# Patient Record
Sex: Male | Born: 1945 | Race: White | Hispanic: No | Marital: Married | Smoking: Former smoker
Health system: Southern US, Community
[De-identification: ages and names within clinical notes are randomized; demographics above are authoritative.]

## PROBLEM LIST (undated history)

## (undated) DIAGNOSIS — R51 Headache: Secondary | ICD-10-CM

## (undated) DIAGNOSIS — H918X9 Other specified hearing loss, unspecified ear: Secondary | ICD-10-CM

## (undated) DIAGNOSIS — R0989 Other specified symptoms and signs involving the circulatory and respiratory systems: Secondary | ICD-10-CM

## (undated) DIAGNOSIS — K219 Gastro-esophageal reflux disease without esophagitis: Secondary | ICD-10-CM

## (undated) DIAGNOSIS — T7840XA Allergy, unspecified, initial encounter: Secondary | ICD-10-CM

## (undated) DIAGNOSIS — E785 Hyperlipidemia, unspecified: Secondary | ICD-10-CM

## (undated) DIAGNOSIS — N529 Male erectile dysfunction, unspecified: Secondary | ICD-10-CM

## (undated) DIAGNOSIS — K429 Umbilical hernia without obstruction or gangrene: Secondary | ICD-10-CM

## (undated) DIAGNOSIS — M79609 Pain in unspecified limb: Secondary | ICD-10-CM

## (undated) DIAGNOSIS — R5383 Other fatigue: Secondary | ICD-10-CM

## (undated) DIAGNOSIS — I340 Nonrheumatic mitral (valve) insufficiency: Secondary | ICD-10-CM

## (undated) DIAGNOSIS — R5381 Other malaise: Secondary | ICD-10-CM

## (undated) DIAGNOSIS — M109 Gout, unspecified: Secondary | ICD-10-CM

## (undated) DIAGNOSIS — I10 Essential (primary) hypertension: Secondary | ICD-10-CM

## (undated) DIAGNOSIS — G5600 Carpal tunnel syndrome, unspecified upper limb: Secondary | ICD-10-CM

## (undated) DIAGNOSIS — H269 Unspecified cataract: Secondary | ICD-10-CM

## (undated) DIAGNOSIS — R0609 Other forms of dyspnea: Secondary | ICD-10-CM

## (undated) DIAGNOSIS — F988 Other specified behavioral and emotional disorders with onset usually occurring in childhood and adolescence: Secondary | ICD-10-CM

## (undated) DIAGNOSIS — M1990 Unspecified osteoarthritis, unspecified site: Secondary | ICD-10-CM

## (undated) DIAGNOSIS — R6882 Decreased libido: Secondary | ICD-10-CM

## (undated) HISTORY — DX: Essential (primary) hypertension: I10

## (undated) HISTORY — DX: Gastro-esophageal reflux disease without esophagitis: K21.9

## (undated) HISTORY — DX: Carpal tunnel syndrome, unspecified upper limb: G56.00

## (undated) HISTORY — DX: Other malaise: R53.81

## (undated) HISTORY — DX: Hyperlipidemia, unspecified: E78.5

## (undated) HISTORY — DX: Other specified behavioral and emotional disorders with onset usually occurring in childhood and adolescence: F98.8

## (undated) HISTORY — DX: Unspecified cataract: H26.9

## (undated) HISTORY — DX: Gout, unspecified: M10.9

## (undated) HISTORY — DX: Other forms of dyspnea: R06.09

## (undated) HISTORY — DX: Decreased libido: R68.82

## (undated) HISTORY — DX: Other specified symptoms and signs involving the circulatory and respiratory systems: R09.89

## (undated) HISTORY — DX: Pain in unspecified limb: M79.609

## (undated) HISTORY — PX: UPPER GASTROINTESTINAL ENDOSCOPY: SHX188

## (undated) HISTORY — DX: Other specified hearing loss, unspecified ear: H91.8X9

## (undated) HISTORY — DX: Male erectile dysfunction, unspecified: N52.9

## (undated) HISTORY — DX: Nonrheumatic mitral (valve) insufficiency: I34.0

## (undated) HISTORY — PX: OTHER SURGICAL HISTORY: SHX169

## (undated) HISTORY — PX: CATARACT EXTRACTION: SUR2

## (undated) HISTORY — DX: Headache: R51

## (undated) HISTORY — DX: Allergy, unspecified, initial encounter: T78.40XA

## (undated) HISTORY — DX: Other fatigue: R53.83

## (undated) HISTORY — PX: UMBILICAL HERNIA REPAIR: SHX196

## (undated) HISTORY — DX: Umbilical hernia without obstruction or gangrene: K42.9

## (undated) HISTORY — DX: Unspecified osteoarthritis, unspecified site: M19.90

---
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
## 1968-11-05 HISTORY — PX: ELBOW SURGERY: SHX618

## 2005-02-13 ENCOUNTER — Ambulatory Visit: Payer: Self-pay | Admitting: Internal Medicine

## 2005-03-26 ENCOUNTER — Ambulatory Visit: Payer: Self-pay | Admitting: Gastroenterology

## 2005-04-06 ENCOUNTER — Ambulatory Visit: Payer: Self-pay | Admitting: Internal Medicine

## 2005-04-24 HISTORY — PX: COLONOSCOPY: SHX174

## 2005-08-23 ENCOUNTER — Ambulatory Visit: Payer: Self-pay | Admitting: Internal Medicine

## 2006-05-23 ENCOUNTER — Ambulatory Visit: Payer: Self-pay | Admitting: Internal Medicine

## 2006-06-20 ENCOUNTER — Ambulatory Visit: Payer: Self-pay | Admitting: Internal Medicine

## 2006-08-29 ENCOUNTER — Ambulatory Visit: Payer: Self-pay | Admitting: Internal Medicine

## 2007-02-28 ENCOUNTER — Ambulatory Visit: Payer: Self-pay | Admitting: Internal Medicine

## 2007-05-19 ENCOUNTER — Ambulatory Visit: Payer: Self-pay | Admitting: Internal Medicine

## 2007-05-19 LAB — CONVERTED CEMR LAB
Basophils Absolute: 0.1 10*3/uL (ref 0.0–0.1)
CO2: 30 meq/L (ref 19–32)
Cholesterol: 125 mg/dL (ref 0–200)
Eosinophils Absolute: 0.4 10*3/uL (ref 0.0–0.6)
Eosinophils Relative: 5.9 % — ABNORMAL HIGH (ref 0.0–5.0)
GFR calc non Af Amer: 66 mL/min
Glucose, Bld: 104 mg/dL — ABNORMAL HIGH (ref 70–99)
HDL: 33.6 mg/dL — ABNORMAL LOW (ref >39.0)
Hemoglobin, Urine: NEGATIVE
Hemoglobin: 14.8 g/dL (ref 13.0–17.0)
Ketones, ur: NEGATIVE mg/dL
Lymphocytes Relative: 28.3 % (ref 12.0–46.0)
Neutrophils Relative %: 57.3 % (ref 43.0–77.0)
PSA: 0.78 ng/mL (ref 0.10–4.00)
Platelets: 191 10*3/uL (ref 150–400)
Potassium: 4.3 meq/L (ref 3.5–5.1)
RBC: 4.82 M/uL (ref 4.22–5.81)
RDW: 12.2 % (ref 11.5–14.6)
Specific Gravity, Urine: >=1.03 (ref 1.000–1.03)
TSH: 0.97 microintl units/mL (ref 0.35–5.50)
Urine Glucose: NEGATIVE mg/dL
VLDL: 11 mg/dL (ref 0–40)
WBC: 6 10*3/uL (ref 4.5–10.5)
pH: 5.5 (ref 5.0–8.0)

## 2007-05-20 ENCOUNTER — Ambulatory Visit: Payer: Self-pay | Admitting: Internal Medicine

## 2007-05-20 DIAGNOSIS — I10 Essential (primary) hypertension: Secondary | ICD-10-CM

## 2007-05-20 DIAGNOSIS — K219 Gastro-esophageal reflux disease without esophagitis: Secondary | ICD-10-CM

## 2007-05-20 HISTORY — DX: Gastro-esophageal reflux disease without esophagitis: K21.9

## 2007-05-20 HISTORY — DX: Essential (primary) hypertension: I10

## 2008-07-09 ENCOUNTER — Ambulatory Visit: Payer: Self-pay | Admitting: Internal Medicine

## 2008-07-09 DIAGNOSIS — F988 Other specified behavioral and emotional disorders with onset usually occurring in childhood and adolescence: Secondary | ICD-10-CM | POA: Insufficient documentation

## 2008-07-09 DIAGNOSIS — E785 Hyperlipidemia, unspecified: Secondary | ICD-10-CM

## 2008-07-09 HISTORY — DX: Hyperlipidemia, unspecified: E78.5

## 2008-07-09 HISTORY — DX: Other specified behavioral and emotional disorders with onset usually occurring in childhood and adolescence: F98.8

## 2008-07-13 ENCOUNTER — Ambulatory Visit: Payer: Self-pay | Admitting: Internal Medicine

## 2008-07-14 LAB — CONVERTED CEMR LAB
ALT: 29 units/L (ref 0–53)
BUN: 13 mg/dL (ref 6–23)
Basophils Relative: 0.7 % (ref 0.0–3.0)
Bilirubin Urine: NEGATIVE
CO2: 31 meq/L (ref 19–32)
GFR calc Af Amer: 73 mL/min
Glucose, Bld: 120 mg/dL — ABNORMAL HIGH (ref 70–99)
Hemoglobin, Urine: NEGATIVE
Leukocytes, UA: NEGATIVE
Lymphocytes Relative: 22.5 % (ref 12.0–46.0)
Monocytes Relative: 6.2 % (ref 3.0–12.0)
Neutro Abs: 4 10*3/uL (ref 1.4–7.7)
Neutrophils Relative %: 66.2 % (ref 43.0–77.0)
Nitrite: NEGATIVE
Sodium: 142 meq/L (ref 135–145)
Total Protein, Urine: NEGATIVE mg/dL
Total Protein: 6.9 g/dL (ref 6.0–8.3)
Triglycerides: 43 mg/dL (ref 0–149)
VLDL: 9 mg/dL (ref 0–40)
WBC: 6 10*3/uL (ref 4.5–10.5)

## 2008-11-08 ENCOUNTER — Telehealth: Payer: Self-pay | Admitting: Internal Medicine

## 2009-04-21 ENCOUNTER — Telehealth (INDEPENDENT_AMBULATORY_CARE_PROVIDER_SITE_OTHER): Payer: Self-pay | Admitting: *Deleted

## 2009-05-06 ENCOUNTER — Telehealth: Payer: Self-pay | Admitting: Internal Medicine

## 2009-05-16 ENCOUNTER — Telehealth: Payer: Self-pay | Admitting: Internal Medicine

## 2009-06-15 ENCOUNTER — Telehealth (INDEPENDENT_AMBULATORY_CARE_PROVIDER_SITE_OTHER): Payer: Self-pay | Admitting: *Deleted

## 2009-06-17 ENCOUNTER — Ambulatory Visit: Payer: Self-pay | Admitting: Internal Medicine

## 2009-06-17 LAB — CONVERTED CEMR LAB
Alkaline Phosphatase: 40 units/L (ref 39–117)
Basophils Absolute: 0 10*3/uL (ref 0.0–0.1)
Bilirubin, Direct: 0.2 mg/dL (ref 0.0–0.3)
CO2: 29 meq/L (ref 19–32)
Calcium: 9.2 mg/dL (ref 8.4–10.5)
Chloride: 107 meq/L (ref 96–112)
Cholesterol: 117 mg/dL (ref 0–200)
Eosinophils Absolute: 0.3 10*3/uL (ref 0.0–0.7)
Glucose, Bld: 101 mg/dL — ABNORMAL HIGH (ref 70–99)
HCT: 41.5 % (ref 39.0–52.0)
HDL: 33.4 mg/dL — ABNORMAL LOW (ref >39.00)
Hemoglobin: 14.5 g/dL (ref 13.0–17.0)
Ketones, ur: NEGATIVE mg/dL
MCV: 89 fL (ref 78.0–100.0)
Monocytes Absolute: 0.5 10*3/uL (ref 0.1–1.0)
Monocytes Relative: 7.6 % (ref 3.0–12.0)
Neutro Abs: 4.2 10*3/uL (ref 1.4–7.7)
Potassium: 4.2 meq/L (ref 3.5–5.1)
RBC: 4.67 M/uL (ref 4.22–5.81)
RDW: 11.8 % (ref 11.5–14.6)
Specific Gravity, Urine: 1.02 (ref 1.000–1.030)
Total Bilirubin: 1.2 mg/dL (ref 0.3–1.2)
Total CHOL/HDL Ratio: 4
Total Protein, Urine: NEGATIVE mg/dL
Urine Glucose: NEGATIVE mg/dL
WBC: 6.7 10*3/uL (ref 4.5–10.5)
pH: 7 (ref 5.0–8.0)

## 2009-06-24 ENCOUNTER — Ambulatory Visit: Payer: Self-pay | Admitting: Internal Medicine

## 2009-08-25 ENCOUNTER — Telehealth: Payer: Self-pay | Admitting: Endocrinology

## 2009-08-25 ENCOUNTER — Ambulatory Visit: Payer: Self-pay | Admitting: Endocrinology

## 2009-08-25 DIAGNOSIS — R51 Headache: Secondary | ICD-10-CM

## 2009-08-25 DIAGNOSIS — R519 Headache, unspecified: Secondary | ICD-10-CM | POA: Insufficient documentation

## 2009-08-25 HISTORY — DX: Headache: R51

## 2009-08-26 LAB — CONVERTED CEMR LAB: Sed Rate: 9 mm/hr (ref 0–22)

## 2009-08-29 ENCOUNTER — Telehealth: Payer: Self-pay | Admitting: Endocrinology

## 2009-12-15 ENCOUNTER — Ambulatory Visit: Payer: Self-pay | Admitting: Internal Medicine

## 2009-12-15 DIAGNOSIS — G5600 Carpal tunnel syndrome, unspecified upper limb: Secondary | ICD-10-CM

## 2009-12-15 DIAGNOSIS — R0989 Other specified symptoms and signs involving the circulatory and respiratory systems: Secondary | ICD-10-CM

## 2009-12-15 DIAGNOSIS — M79609 Pain in unspecified limb: Secondary | ICD-10-CM

## 2009-12-15 DIAGNOSIS — R0609 Other forms of dyspnea: Secondary | ICD-10-CM | POA: Insufficient documentation

## 2009-12-15 DIAGNOSIS — K429 Umbilical hernia without obstruction or gangrene: Secondary | ICD-10-CM

## 2009-12-15 DIAGNOSIS — G5603 Carpal tunnel syndrome, bilateral upper limbs: Secondary | ICD-10-CM | POA: Insufficient documentation

## 2009-12-15 HISTORY — DX: Umbilical hernia without obstruction or gangrene: K42.9

## 2009-12-15 HISTORY — DX: Other forms of dyspnea: R06.09

## 2009-12-15 HISTORY — DX: Carpal tunnel syndrome, unspecified upper limb: G56.00

## 2009-12-15 HISTORY — DX: Pain in unspecified limb: M79.609

## 2009-12-15 HISTORY — DX: Other specified symptoms and signs involving the circulatory and respiratory systems: R09.89

## 2009-12-27 ENCOUNTER — Telehealth (INDEPENDENT_AMBULATORY_CARE_PROVIDER_SITE_OTHER): Payer: Self-pay | Admitting: *Deleted

## 2009-12-27 ENCOUNTER — Ambulatory Visit: Payer: Self-pay | Admitting: Internal Medicine

## 2010-01-03 ENCOUNTER — Encounter: Payer: Self-pay | Admitting: Internal Medicine

## 2010-01-05 ENCOUNTER — Encounter: Payer: Self-pay | Admitting: Internal Medicine

## 2010-01-13 ENCOUNTER — Telehealth: Payer: Self-pay | Admitting: Internal Medicine

## 2010-01-16 ENCOUNTER — Ambulatory Visit (HOSPITAL_COMMUNITY): Admission: RE | Admit: 2010-01-16 | Discharge: 2010-01-16 | Payer: Self-pay | Admitting: Internal Medicine

## 2010-01-16 ENCOUNTER — Ambulatory Visit: Payer: Self-pay

## 2010-01-16 ENCOUNTER — Ambulatory Visit: Payer: Self-pay | Admitting: Cardiology

## 2010-01-16 ENCOUNTER — Encounter: Payer: Self-pay | Admitting: Internal Medicine

## 2010-01-23 ENCOUNTER — Encounter: Payer: Self-pay | Admitting: Internal Medicine

## 2010-03-27 ENCOUNTER — Ambulatory Visit: Payer: Self-pay | Admitting: Internal Medicine

## 2010-03-27 DIAGNOSIS — R5381 Other malaise: Secondary | ICD-10-CM

## 2010-03-27 DIAGNOSIS — R6882 Decreased libido: Secondary | ICD-10-CM | POA: Insufficient documentation

## 2010-03-27 DIAGNOSIS — M109 Gout, unspecified: Secondary | ICD-10-CM

## 2010-03-27 DIAGNOSIS — R5383 Other fatigue: Secondary | ICD-10-CM

## 2010-03-27 HISTORY — DX: Other malaise: R53.81

## 2010-03-27 HISTORY — DX: Decreased libido: R68.82

## 2010-03-27 HISTORY — DX: Gout, unspecified: M10.9

## 2010-04-19 ENCOUNTER — Telehealth: Payer: Self-pay | Admitting: Internal Medicine

## 2010-06-28 ENCOUNTER — Ambulatory Visit: Payer: Self-pay | Admitting: Internal Medicine

## 2010-06-28 LAB — CONVERTED CEMR LAB
ALT: 30 units/L (ref 0–53)
AST: 30 units/L (ref 0–37)
Albumin: 3.9 g/dL (ref 3.5–5.2)
Alkaline Phosphatase: 43 units/L (ref 39–117)
BUN: 17 mg/dL (ref 6–23)
Basophils Absolute: 0 10*3/uL (ref 0.0–0.1)
Basophils Relative: 0.3 % (ref 0.0–3.0)
Bilirubin Urine: NEGATIVE
Bilirubin, Direct: 0.1 mg/dL (ref 0.0–0.3)
CO2: 26 meq/L (ref 19–32)
Calcium: 9.1 mg/dL (ref 8.4–10.5)
Chloride: 108 meq/L (ref 96–112)
Cholesterol: 121 mg/dL (ref 0–200)
Creatinine, Ser: 1.2 mg/dL (ref 0.4–1.5)
Eosinophils Absolute: 0.3 10*3/uL (ref 0.0–0.7)
Eosinophils Relative: 5.2 % — ABNORMAL HIGH (ref 0.0–5.0)
GFR calc non Af Amer: 67.2 mL/min (ref >60)
Glucose, Bld: 102 mg/dL — ABNORMAL HIGH (ref 70–99)
HCT: 38.9 % — ABNORMAL LOW (ref 39.0–52.0)
HDL: 41.3 mg/dL (ref >39.00)
Hemoglobin, Urine: NEGATIVE
Hemoglobin: 13.3 g/dL (ref 13.0–17.0)
Ketones, ur: NEGATIVE mg/dL
LDL Cholesterol: 72 mg/dL (ref 0–99)
Leukocytes, UA: NEGATIVE
Lymphocytes Relative: 26 % (ref 12.0–46.0)
Lymphs Abs: 1.4 10*3/uL (ref 0.7–4.0)
MCHC: 34.3 g/dL (ref 30.0–36.0)
MCV: 89.7 fL (ref 78.0–100.0)
Monocytes Absolute: 0.4 10*3/uL (ref 0.1–1.0)
Monocytes Relative: 6.8 % (ref 3.0–12.0)
Neutro Abs: 3.3 10*3/uL (ref 1.4–7.7)
Neutrophils Relative %: 61.7 % (ref 43.0–77.0)
Nitrite: NEGATIVE
PSA: 1.02 ng/mL (ref 0.10–4.00)
Platelets: 159 10*3/uL (ref 150.0–400.0)
Potassium: 4.3 meq/L (ref 3.5–5.1)
RBC: 4.34 M/uL (ref 4.22–5.81)
RDW: 12.4 % (ref 11.5–14.6)
Sodium: 141 meq/L (ref 135–145)
Specific Gravity, Urine: 1.025 (ref 1.000–1.030)
TSH: 0.73 microintl units/mL (ref 0.35–5.50)
Total Bilirubin: 0.6 mg/dL (ref 0.3–1.2)
Total CHOL/HDL Ratio: 3
Total Protein, Urine: NEGATIVE mg/dL
Total Protein: 6.5 g/dL (ref 6.0–8.3)
Triglycerides: 40 mg/dL (ref 0.0–149.0)
Uric Acid, Serum: 7.1 mg/dL (ref 4.0–7.8)
Urine Glucose: NEGATIVE mg/dL
Urobilinogen, UA: 0.2 (ref 0.0–1.0)
VLDL: 8 mg/dL (ref 0.0–40.0)
WBC: 5.3 10*3/uL (ref 4.5–10.5)
pH: 6 (ref 5.0–8.0)

## 2010-06-29 LAB — CONVERTED CEMR LAB
Sex Hormone Binding: 28 nmol/L (ref 13–71)
Testosterone: 331.02 ng/dL — ABNORMAL LOW (ref 350–890)

## 2010-06-30 ENCOUNTER — Ambulatory Visit: Payer: Self-pay | Admitting: Internal Medicine

## 2010-06-30 DIAGNOSIS — H918X9 Other specified hearing loss, unspecified ear: Secondary | ICD-10-CM

## 2010-06-30 HISTORY — DX: Other specified hearing loss, unspecified ear: H91.8X9

## 2010-07-28 ENCOUNTER — Telehealth: Payer: Self-pay | Admitting: Internal Medicine

## 2010-12-05 NOTE — Miscellaneous (Signed)
Summary: Orders Update pft charges  Clinical Lists Changes  Orders: Added new Service order of Carbon Monoxide diffusing w/capacity (94720) - Signed Added new Service order of Lung Volumes (94240) - Signed Added new Service order of Spirometry (Pre & Post) (94060) - Signed 

## 2010-12-05 NOTE — Assessment & Plan Note (Signed)
Summary: CPX/NWS  #   Vital Signs:  Patient profile:   65 year old male Height:      74 inches Weight:      215 pounds BMI:     27.70 O2 Sat:      97 % on Room air Temp:     98 degrees F oral Pulse rate:   61 / minute BP sitting:   110 / 80  (left arm) Cuff size:   large  Vitals Entered By: Zella Ball Ewing CMA (AAMA) (June 30, 2010 8:45 AM)  O2 Flow:  Room air  CC: Adult Physical/RE   CC:  Adult Physical/RE.  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 Take 1 Tablet By Mouth Once A Day 2)  Nexium 40 Mg Cpdr (Esomeprazole Magnesium) .... Take 1 Tablet By Mouth Once A Day 3)  Benazepril Hcl 5 Mg Tabs (Benazepril Hcl) .... Take 1 By Mouth Once Daily 4)  Adult Aspirin Ec Low Strength 81 Mg Tbec (Aspirin) .Marland Kitchen.. 1 By Mouth Once Daily 5)  Dulera 100-5 Mcg/act Aero (Mometasone Furo-Formoterol Fum) .... 2 Spray/side Once Daily 6)  Proair Hfa 108 (90 Base) Mcg/act Aers (Albuterol Sulfate) .... 2 Pufss Four Times Per Day As Needed For Shortness of Breath 7)   Allopurinol 100 Mg Tabs (Allopurinol) .Marland Kitchen.. 1po Once Daily  Current Medications (verified): 1)  Crestor 20 Mg Tabs (Rosuvastatin Calcium) .... Take 1 Tablet By Mouth Once A Day 2)  Nexium 40 Mg Cpdr (Esomeprazole Magnesium) .... Take 1 Tablet By Mouth Once A Day 3)  Benazepril Hcl 5 Mg Tabs (Benazepril Hcl) .... Take 1 By Mouth Once Daily 4)  Adult Aspirin Ec Low Strength 81 Mg Tbec (Aspirin) .Marland Kitchen.. 1 By Mouth Once Daily 5)  Dulera 100-5 Mcg/act Aero (Mometasone Furo-Formoterol Fum) .... 2 Spray/side Once Daily 6)  Proair Hfa 108 (90 Base) Mcg/act Aers (Albuterol Sulfate) .... 2 Pufss Four Times Per Day As Needed For Shortness of Breath 7)  Allopurinol 100 Mg Tabs (Allopurinol) .Marland Kitchen.. 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 PREVENTIVE HEALTH CARE (ICD-V70.0) ADD (ICD-314.00) HYPERLIPIDEMIA (ICD-272.4) HYPERTENSION (ICD-401.9) GERD (ICD-530.81) Gout  Past Surgical History: Last updated: 07/09/2008 s/p basal cell cancer s/p right elbow surgury /p ankle surgury  Family History: Last updated: 07/09/2008 brother with lung cancer HTN - father mother with HTN, ETOH daughter with PFO, and embolic stroke at 63 yo  Social History: Last updated: 07/09/2008 [employer] - work  Former Smoker Alcohol use-yes Married 3 daughters  Risk Factors: Smoking Status: quit (07/09/2008)  Review of Systems  The patient denies anorexia, fever, weight loss, weight gain, vision loss, decreased hearing, hoarseness, chest pain, syncope, dyspnea on exertion, peripheral edema, prolonged cough, headaches, hemoptysis, abdominal pain, melena, hematochezia, severe indigestion/heartburn, hematuria, muscle weakness, suspicious skin lesions, transient blindness, difficulty walking, depression, unusual weight change, abnormal bleeding, enlarged lymph nodes, and angioedema.         all  otherwise negative per pt -  except for midl decreased hearing with wax  Physical Exam  General:  alert and well-hydrated.   Eyes:  vision grossly intact, pupils equal, and pupils round.   Ears:  R ear normal and L ear normal.   Nose:  no external deformity and no nasal discharge.   Mouth:  no gingival abnormalities and pharynx pink and moist.   Neck:  supple and no masses.   Lungs:  normal respiratory effort and normal breath sounds.   Heart:  normal rate and regular rhythm.   Abdomen:  soft, non-tender, and normal bowel sounds.   Msk:  no joint tenderness and no joint swelling.   Extremities:  no edema, no erythema  Neurologic:  cranial nerves II-XII intact and strength normal in all extremities.   Skin:  color normal and no rashes.   Psych:  not anxious appearing and not depressed appearing.     Impression & Recommendations:  Problem # 1:  Preventive Health Care (ICD-V70.0) Overall doing well, age appropriate education and counseling updated and referral for appropriate preventive services done unless declined, immunizations up to date or declined, diet counseling done if overweight, urged to quit smoking if smokes , most recent labs reviewed and current ordered if appropriate, ecg reviewed or declined (interpretation per ECG scanned in the EMR if done); information regarding Medicare Prevention requirements given if appropriate; speciality referrals updated as appropriate   Problem # 2:  OTHER SPECIFIED FORMS OF HEARING LOSS (ICD-389.8) for irrigation bilat with hearing improved  Complete Medication List: 1)  Crestor 20 Mg Tabs (Rosuvastatin calcium) .... Take 1 tablet by mouth once a day 2)  Nexium 40 Mg Cpdr (Esomeprazole magnesium) .... Take 1 tablet by mouth once a day 3)  Benazepril Hcl 5 Mg Tabs (Benazepril hcl) .... Take 1 by mouth once daily 4)  Adult Aspirin Ec Low Strength 81 Mg Tbec (Aspirin) .Marland Kitchen.. 1 by mouth once daily 5)  Dulera 100-5 Mcg/act Aero (Mometasone  furo-formoterol fum) .... 2 spray/side once daily 6)  Proair Hfa 108 (90 Base) Mcg/act Aers (Albuterol sulfate) .... 2 pufss four times per day as needed for shortness of breath 7)  Allopurinol 100 Mg Tabs (Allopurinol) .Marland Kitchen.. 1po once daily  Other Orders: Admin 1st Vaccine (16109) Flu Vaccine 40yrs + [phone])  Patient Instructions: 1)  you had the flu shot today 2)  You ears were irrigated today of wax 3)  You are given the refills 4)  Please try to be more physically active with regualr excercise such as walking 5)  Please schedule a follow-up appointment in 1 year or sooner if needed Prescriptions: ALLOPURINOL 100 MG TABS (ALLOPURINOL) 1po once daily  #90 x 3   Entered and Authorized by:   Corwin Levins MD   Signed by:   Corwin Levins MD on 06/30/2010   Method used:   Print then Give to Patient   RxID:   [id number] PROAIR HFA 108 (90 BASE) MCG/ACT AERS (ALBUTEROL SULFATE) 2 pufss  four times per day as needed for shortness of breath  #1 x 11   Entered and Authorized by:   Corwin Levins MD   Signed by:   Corwin Levins MD on 06/30/2010   Method used:   Print then Give to Patient   RxID:   [id number] DULERA 100-5 MCG/ACT AERO (MOMETASONE FURO-FORMOTEROL FUM) 2 spray/side once daily  #1 x 11   Entered and Authorized by:   Corwin Levins MD   Signed by:   Corwin Levins MD on 06/30/2010   Method used:   Print then Give to Patient   RxID:   [id number] BENAZEPRIL HCL 5 MG TABS (BENAZEPRIL HCL) TAKE 1 by mouth once daily  #90 x 3   Entered and Authorized by:   Corwin Levins MD   Signed by:   Corwin Levins MD on 06/30/2010   Method used:   Print then Give to Patient   RxID:   [id number] NEXIUM 40 MG CPDR (ESOMEPRAZOLE MAGNESIUM) Take 1 tablet by mouth once a day  #90 x 3   Entered and Authorized by:   Corwin Levins MD   Signed by:   Corwin Levins MD on 06/30/2010   Method used:   Print then Give to Patient   RxID:   [id number] CRESTOR 20 MG TABS (ROSUVASTATIN CALCIUM)  Take 1 tablet by mouth once a day  #90 x 3   Entered and Authorized by:   Corwin Levins MD   Signed by:   Corwin Levins MD on 06/30/2010   Method used:   Print then Give to Patient   RxID:   [id number]      Flu Vaccine Consent Questions     Do you have a history of severe allergic reactions to this vaccine? no    Any prior history of allergic reactions to egg and/or gelatin? no    Do you have a sensitivity to the preservative Thimersol? no    Do you have a past history of Guillan-Barre Syndrome? no    Do you currently have an acute febrile illness? no    Have you ever had a severe reaction to latex? no    Vaccine information given and explained to patient? yes    Are you currently pregnant? no    Lot Number:[id number]   Exp Date:05/05/2011   Site Given  Left Deltoid IMlu

## 2010-12-05 NOTE — Assessment & Plan Note (Signed)
Summary: HERNIA /NWS  #   Vital Signs:  Patient profile:   65 year old male Height:      74 inches (187.96 cm) Weight:      220.50 pounds (100.23 kg) BMI:     28.41 O2 Sat:      95 % on Room air Temp:     97.3 degrees F (36.28 degrees C) oral Pulse rate:   62 / minute BP sitting:   122 / 68  Vitals Entered ByZella Ball Ewing (December 15, 2009 3:24 PM)  O2 Flow:  Room air CC: follow up visit/hernia/re   CC:  follow up visit/hernia/re.  History of Present Illness: here to c/o incresaed intermittent pain to the umbilical area and sliightly to the right mid abd area increased recently possibly assoc with increased wt and abd fat but clearly worse with even mild daily activities such as bending to tie the shoes;  has known umbilic hernia essentially asympt to date, but now somewhat tender but he can "still push it back in".  No fever, other abd pain, n/v, change in bowel or bladder symtpoms o/w; and no BRBPR.    also mentions the right hand first MCP area seems to have a hard bony area to it enlarging in the past few months with incresed mild pain but no soft tissue sweling, injury, fever, other trauma.  Has hx of bone spur to elbows and wonders if this could be similar.  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  No other hand, wrist or arm symtpoms, and no numbness or loss or grip strength.    Also menitons he has had stable but persistnet significant DOE for the past approx one year;  Has some deconditiong due to lack of regular excercise, and gives a nod to his age and has tried to ignore his symtpoms to which he is not exactly sure when started, but brings it up today as he just cant seem to function as well as last year, in that he has signfiicatn DOE with activities out of proportion , such as climbing just one flight or stairs, hiking with the family (has to stop frequently on hikes he used to do more easily) and even yardwork.  Pt denies  wheezing, orthopnea, pnd,  worsening LE edema, palps, dizziness or syncope .  Quit smoking years ago;  remembers his father as smoker who had to use an inhaler for symptoms.  No falls or other injury, no fever or cough.  No recent new pets or travel.   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 Take 1 Tablet By Mouth Once A Day 2)  Nexium 40 Mg Cpdr (Esomeprazole Magnesium) .... Take 1 Tablet By Mouth Once A Day 3)  Benazepril Hcl 5 Mg Tabs (Benazepril Hcl) .... Take 1 By Mouth Once Daily 4)  Adult Aspirin Ec Low Strength 81 Mg Tbec (Aspirin) .Marland Kitchen.. 1 By Mouth Once Daily  Current Medications (verified): 1)  Crestor 20 Mg Tabs (Rosuvastatin Calcium) .... Take 1 Tablet By Mouth Once A Day 2)  Nexium 40 Mg Cpdr (Esomeprazole Magnesium) .... Take 1 Tablet By Mouth Once A Day 3)  Benazepril Hcl 5 Mg Tabs (Benazepril Hcl) .... Take 1 By Mouth Once Daily 4)  Adult Aspirin Ec Low Strength 81 Mg Tbec (Aspirin) .Marland Kitchen.. 1 By Mouth Once Daily 5)  Dulera 100-5 Mcg/act Aero (Mometasone Furo-Formoterol Fum) .... 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   Head:  normocephalic and atraumatic.   Eyes:  vision grossly intact, pupils equal, and pupils round.   Ears:  R ear normal and L ear normal.   Nose:  no external deformity and no nasal discharge.   Mouth:  no gingival abnormalities and pharynx pink and moist.   Neck:  supple and no masses.   Lungs:  normal respiratory effort and normal breath sounds.   Heart:  normal rate and regular rhythm.   Abdomen:  soft, non-tender, and normal bowel sounds.  , has unbilic hernia easily reducible and minimal tender Msk:  no joint tenderness and no joint swelling. - right hand first mcp with some mild bony enlarged nontender and no synovitis or tendonitis Extremities:  no edema, no erythema  Neurologic:  alert & oriented X3 and cranial nerves II-XII intact.     Impression & Recommendations:  Problem # 1:  HERNIA, UMBILICAL (ICD-553.1) refer surgury for increased symptomatic Orders: Surgical Referral (Surgery)  Problem # 2:  DYSPNEA ON EXERTION (ICD-786.09)  His updated medication list for this problem includes:    Benazepril Hcl 5 Mg Tabs (Benazepril hcl) .Marland Kitchen... Take 1 by mouth once daily    Dulera 100-5 Mcg/act Aero (Mometasone furo-formoterol fum) .Marland Kitchen... 2 spray/side once daily  Orders: EKG w/ Interpretation (93000) Echo Referral (Echo) Misc. Referral (Misc. Ref) T-2 View CXR, Same Day (71020.5TC)  etiology unclear - will check cxr, ecg reviewed, check echo and PFT's, and trial dulera (we have sample today) to see if helps at all with symtpoms;  consider pulm consult, consider CV referral  and ? need stress test  Problem # 3:  HYPERTENSION (ICD-401.9)  His updated medication list for this problem includes:    Benazepril Hcl 5 Mg Tabs (Benazepril hcl) .Marland Kitchen... Take 1 by mouth once daily  BP today: 122/68 Prior BP: 158/88 (08/25/2009)  Labs Reviewed: K+: 4.2 (06/17/2009) Creat: : 1.3 (06/17/2009)    Chol: 117 (06/17/2009)   HDL: 33.40 (06/17/2009)   LDL: 72 (06/17/2009)   TG: 57.0 (06/17/2009) stable overall by hx and exam, ok to continue meds/tx as is   Problem # 4:  HAND PAIN (ICD-729.5) right hand, first mcp, mild at best, no synovitis, exam c/w prob bony spur like abnormality first mcp - ok to follow for now  Complete Medication List: 1)  Crestor 20 Mg Tabs (Rosuvastatin calcium) .... Take 1 tablet by mouth once a day 2)  Nexium 40 Mg Cpdr (Esomeprazole magnesium) .... Take 1 tablet by mouth once a day 3)  Benazepril Hcl 5 Mg Tabs (Benazepril hcl) .... Take 1 by mouth once daily 4)  Adult Aspirin  Ec Low Strength 81 Mg Tbec (Aspirin) .Marland Kitchen.. 1 by mouth once daily 5)  Dulera 100-5 Mcg/act Aero (Mometasone furo-formoterol fum) .... 2 spray/side once daily  Patient Instructions: 1)  You will be contacted about the referral(s) to: general surgury for the hernia 2)  Your EKG was good today 3)  Please go to Radiology in the basement level for your X-Ray today  4)  You will be contacted about the referral(s) to: Lung testing, and Echocardiogram 5)  please try the dulera at 2 spray twice per day 6)  Please schedule a follow-up appointment in 6 months with CPX labs, or sooner if needed Prescriptions: DULERA 100-5 MCG/ACT AERO (MOMETASONE FURO-FORMOTEROL FUM) 2 spray/side once daily  #1 x 11   Entered and Authorized by:   Corwin Levins MD   Signed by:   Corwin Levins MD on 12/15/2009   Method used:   Print then Give to Patient   RxID:   [id number]

## 2010-12-05 NOTE — Progress Notes (Signed)
Summary: Rx req  Phone Note Refill Request Message from:  Patient on July 28, 2010 11:27 AM  Initial call taken by: Margaret Pyle, CMA,  July 28, 2010 11:27 AM    Prescriptions: ALLOPURINOL 100 MG TABS (ALLOPURINOL) 1po once daily  #90 x 3   Entered by:   Margaret Pyle, CMA   Authorized by:   Corwin Levins MD   Signed by:   Margaret Pyle, CMA on 07/28/2010   Method used:   Electronically to        [facility]* (retail)             ,          Ph: [phone]       Fax: [phone]   RxID:   [id number] PROAIR HFA 108 (90 BASE) MCG/ACT AERS (ALBUTEROL SULFATE) 2 pufss four times per day as needed for shortness of breath  #3 x 3   Entered by:   Margaret Pyle, CMA   Authorized by:   Corwin Levins MD   Signed by:   Margaret Pyle, CMA on 07/28/2010   Method used:   Electronically to        [facility]* (retail)             ,          Ph: [phone]       Fax: [phone]   RxID:   [id number] DULERA 100-5 MCG/ACT AERO (MOMETASONE FURO-FORMOTEROL FUM) 2 spray/side once daily  #3 x 3   Entered by:   Margaret Pyle, CMA   Authorized by:   Corwin Levins MD   Signed by:   Margaret Pyle, CMA on 07/28/2010   Method used:   Electronically to        [facility]* (retail)             ,          Ph: [phone]       Fax: [phone]   RxID:   [id number] BENAZEPRIL HCL 5 MG TABS (BENAZEPRIL HCL) TAKE 1 by mouth once daily  #90 x 3   Entered by:   Margaret Pyle, CMA   Authorized by:   Corwin Levins MD   Signed by:   Margaret Pyle, CMA on 07/28/2010   Method used:   Electronically to        [facility]* (retail)             ,          Ph: [phone]       Fax: [phone]   RxID:   [id number] NEXIUM 40 MG CPDR (ESOMEPRAZOLE MAGNESIUM) Take 1 tablet by mouth once a day  #90 x 3   Entered by:   Margaret Pyle, CMA   Authorized by:   Corwin Levins MD  Signed by:   Margaret Pyle, CMA on 07/28/2010   Method used:   Electronically to        [facility]* (retail)             ,          Ph: [phone]       Fax: [phone]   RxID:   [id number] CRESTOR 20 MG TABS (ROSUVASTATIN CALCIUM) Take 1 tablet by mouth once a day  #90 x 3   Entered by:   Margaret Pyle, CMA   Authorized by:   Corwin Levins MD   Signed by:  Margaret Pyle, CMA on 07/28/2010   Method used:   Electronically to        [facility]* (retail)             ,          Ph: [phone]       Fax: [phone]   RxID:   [id number]

## 2010-12-05 NOTE — Progress Notes (Signed)
----   Converted from flag ---- ---- 12/23/2009 9:59 AM, Edman Circle wrote: appt 2/24 @ 3:00  ---- 12/22/2009 4:09 PM, Dagoberto Reef wrote: Sophronia Simas, this pt need an echo.  Thanks ------------------------------

## 2010-12-05 NOTE — Consult Note (Signed)
Summary: [facility]   Imported By: Sherian Rein 03/10/2010 14:10:25  _____________________________________________________________________  External Attachment:    Type:   Image     Comment:   External Document

## 2010-12-05 NOTE — Progress Notes (Signed)
  Phone Note Call from Patient   Caller: Patient [phone] VM OK Summary of Call: pt called to ask MD if it was still necessary for him to have ECHO done after based on results of PFT. please advise Initial call taken by: Margaret Pyle, CMA,  January 13, 2010 1:46 PM  Follow-up for Phone Call        I would, since it hard to tell if the lung test results explains all of the symptoms Follow-up by: Corwin Levins MD,  January 13, 2010 2:12 PM  Additional Follow-up for Phone Call Additional follow up Details #1::        pt imformed and agreed to have ECHO done Additional Follow-up by: Margaret Pyle, CMA,  January 13, 2010 2:17 PM

## 2010-12-05 NOTE — Progress Notes (Signed)
  Phone Note Refill Request  on April 19, 2010 7:33 AM  Refills Requested: Medication #1:  BENAZEPRIL HCL 5 MG TABS TAKE 1 by mouth once daily   Dosage confirmed as above?Dosage Confirmed   Last Refilled: 06/2009   Notes: [facility] Initial call taken by: Scharlene Gloss,  April 19, 2010 7:33 AM    Prescriptions: BENAZEPRIL HCL 5 MG TABS (BENAZEPRIL HCL) TAKE 1 by mouth once daily  #90 x 3   Entered by:   Scharlene Gloss   Authorized by:   Corwin Levins MD   Signed by:   Scharlene Gloss on 04/19/2010   Method used:   Faxed to ...       [facility] (mail-order)             , [place]         Ph: [phone]       Fax: [phone]   RxID:   [id number]

## 2010-12-05 NOTE — Assessment & Plan Note (Signed)
Summary: WANTING RX FOR GOUT/NWS  #   Vital Signs:  Patient profile:   65 year old male Height:      73 inches Weight:      215.50 pounds BMI:     28.53 O2 Sat:      97 % on Room air Temp:     97.7 degrees F oral Pulse rate:   66 / minute BP sitting:   122 / 80  (left arm) Cuff size:   large  Vitals Entered ByZella Ball Ewing (Mar 27, 2010 8:22 AM)  O2 Flow:  Room air  CC: Discuss Rx for Gout/RE   CC:  Discuss Rx for Gout/RE.  History of Present Illness: overall doing well; but with recurring low grade pain, swelling, erythema to first MTP - several episodes in the past few months, does not want to consider recurring nsaid or expensive colchrys, is trying to avoid high protein diet;  Pt denies CP, sob, doe, wheezing, orthopnea, pnd, worsening LE edema, palps, dizziness or syncope  Pt denies new neuro symptoms such as headache, facial or extremity weakness, but does have significant fatigue for unclear reasons.  No wt loss, night sweats, depressive symptoms or hypersomnolence.  Also with decreased libido, mild but noticeable and reqeusts testosterone check after seeing recent TV ads.       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 Take 1 Tablet By Mouth Once A Day 2)  Nexium 40 Mg Cpdr (Esomeprazole Magnesium) .... Take 1 Tablet By Mouth Once A Day 3)  Benazepril Hcl 5 Mg Tabs (Benazepril Hcl) .... Take 1 By Mouth Once Daily 4)  Adult Aspirin Ec Low Strength 81 Mg Tbec (Aspirin) .Marland Kitchen.. 1 By Mouth Once  Daily 5)  Dulera 100-5 Mcg/act Aero (Mometasone Furo-Formoterol Fum) .... 2 Spray/side Once Daily 6)  Proair Hfa 108 (90 Base) Mcg/act Aers (Albuterol Sulfate) .... 2 Pufss Four Times Per Day As Needed For Shortness of Breath  Current Medications (verified): 1)  Crestor 20 Mg Tabs (Rosuvastatin Calcium) .... Take 1 Tablet By Mouth Once A Day 2)  Nexium 40 Mg Cpdr (Esomeprazole Magnesium) .... Take 1 Tablet By Mouth Once A Day 3)  Benazepril Hcl 5 Mg Tabs (Benazepril Hcl) .... Take 1 By Mouth Once Daily 4)  Adult Aspirin Ec Low Strength 81 Mg Tbec (Aspirin) .Marland Kitchen.. 1 By Mouth Once Daily 5)  Dulera 100-5 Mcg/act Aero (Mometasone Furo-Formoterol Fum) .... 2 Spray/side Once Daily 6)  Proair Hfa 108 (90 Base) Mcg/act Aers (Albuterol Sulfate) .... 2 Pufss Four Times Per Day As Needed For Shortness of Breath 7)  Allopurinol 100 Mg Tabs (Allopurinol) .Marland Kitchen.. 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 s/p basal cell cancer s/p right elbow surgury /p ankle surgury  Review of Systems       all otherwise negative per pt -    Physical Exam  General:  alert and overweight-appearing.   Head:  normocephalic and atraumatic.   Eyes:  vision grossly intact, pupils equal, and pupils round.   Ears:  R ear normal and L ear normal.   Nose:  no external deformity and no nasal discharge.   Mouth:  no gingival abnormalities and pharynx pink and moist.   Neck:  supple and no masses.   Lungs:  normal respiratory effort and normal breath  sounds.   Heart:  normal rate and regular rhythm.   Msk:  right foot currently without erythema, tender, sweling o/w neurovasc intact Extremities:  no edema, no erythema    Impression & Recommendations:  Problem # 1:  GOUT (ICD-274.9)  His updated medication list for this problem includes:    Allopurinol 100 Mg Tabs (Allopurinol) .Marland Kitchen... 1po once daily treat as above, f/u any worsening signs or symptoms , declines colchrys;  understands risk;  to check lab with next vist  Problem # 2:  FATIGUE (ICD-780.79) unclear etiology, exam and hx as above benign,  declines labs today, ok to follow with expectant management - ? stress related  Problem # 3:  LIBIDO, DECREASED (ICD-799.81) d/w pt, ok for testosterone check with next visit labs, does not have significant worsening ED symtpoms  Problem # 4:  HYPERTENSION (ICD-401.9)  His updated medication list for this problem includes:    Benazepril Hcl 5 Mg Tabs (Benazepril hcl) .Marland Kitchen... Take 1 by mouth once daily  BP today: 122/80 Prior BP: 122/68 (12/15/2009)  Labs Reviewed: K+: 4.2 (06/17/2009) Creat: : 1.3 (06/17/2009)   Chol: 117 (06/17/2009)   HDL: 33.40 (06/17/2009)   LDL: 72 (06/17/2009)   TG: 57.0 (06/17/2009) stable overall by hx and exam, ok to continue meds/tx as is   Complete Medication List: 1)  Crestor 20 Mg Tabs (Rosuvastatin calcium) .... Take 1 tablet by mouth once a day 2)  Nexium 40 Mg Cpdr (Esomeprazole magnesium) .... Take 1 tablet by mouth once a day 3)  Benazepril Hcl 5 Mg Tabs (Benazepril hcl) .... Take 1 by mouth once daily 4)  Adult Aspirin Ec Low Strength 81 Mg Tbec (Aspirin) .Marland Kitchen.. 1 by mouth once daily 5)  Dulera 100-5 Mcg/act Aero (Mometasone furo-formoterol fum) .... 2 spray/side once daily 6)  Proair Hfa 108 (90 Base) Mcg/act Aers (Albuterol sulfate) .... 2 pufss four times per day as needed for shortness of breath 7)  Allopurinol 100 Mg Tabs (Allopurinol) .Marland Kitchen.. 1po once daily  Patient Instructions: 1)  Please  take all new medications as prescribed 2)  Continue all previous medications as before this visit  3)  Please schedule a follow-up appointment in 3 months with CPX labs  and: 4)  uric acid  274.9 5)  testosterone - total and free -  799.81 Prescriptions: ALLOPURINOL 100 MG TABS (ALLOPURINOL) 1po once daily  #30 x 11   Entered and Authorized by:   Corwin Levins MD   Signed by:   Corwin Levins MD on 03/27/2010   Method used:   Print then Give to Patient   RxID:   [id number]

## 2010-12-05 NOTE — Op Note (Signed)
Summary: [facility]   Imported By: Lester Florence 01/26/2010 10:00:43  _____________________________________________________________________  External Attachment:    Type:   Image     Comment:   External Document

## 2010-12-07 ENCOUNTER — Ambulatory Visit (INDEPENDENT_AMBULATORY_CARE_PROVIDER_SITE_OTHER): Payer: BC Managed Care – PPO | Admitting: Internal Medicine

## 2010-12-07 ENCOUNTER — Encounter: Payer: Self-pay | Admitting: Internal Medicine

## 2010-12-07 DIAGNOSIS — K219 Gastro-esophageal reflux disease without esophagitis: Secondary | ICD-10-CM

## 2010-12-07 DIAGNOSIS — I10 Essential (primary) hypertension: Secondary | ICD-10-CM

## 2010-12-07 DIAGNOSIS — J209 Acute bronchitis, unspecified: Secondary | ICD-10-CM

## 2010-12-27 NOTE — Assessment & Plan Note (Signed)
Summary: COLD SYMPTOMS  STC   Vital Signs:  Patient profile:   65 year old male Height:      74 inches Weight:      216.50 pounds BMI:     27.90 O2 Sat:      97 % on Room air Temp:     98.3 degrees F oral Pulse rate:   76 / minute BP sitting:   112 / 72  (left arm) Cuff size:   small  Vitals Entered By: Zella Ball Ewing CMA (AAMA) (December 07, 2010 4:01 PM)  O2 Flow:  Room air  CC: Congestion, cough, ear pain/RE   CC:  Congestion, cough, and ear pain/RE.  History of Present Illness: here to f/u with 8 days acute onset symptoms midl to mod, but gradually worsening fever, HA, ST, general weakness and malaise, and now prod cough with greenish sputum, fortunately without wheezing;  and Pt denies CP, worsening sob, doe, wheezing, orthopnea, pnd, worsening LE edema, palps, dizziness or syncope  .  Pt denies new neuro symptoms such as headache, facial or extremity weakness  Pt denies polydipsia, polyuria Overall good compliance with meds, trying to follow low chol diet, wt stable. No recent unintentional wt loss, night sweats, loss of appetite or other constitutional symptoms .  Feels he continues to function well at work and socially and does not further require med for ADD.  Denies worsening depressive symptoms, suicidal ideation, or panic.   Overall good compliance with meds, and good tolerability, including the PPI, and no abd pain, n/v, dysphagia, blood.   Preventive Screening-Counseling & Management      Drug Use:  no.    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 Take 1 Tablet By Mouth Once A Day 2)  Nexium 40 Mg Cpdr (Esomeprazole Magnesium) .... Take 1 Tablet By Mouth Once A Day 3)  Benazepril Hcl 5 Mg Tabs (Benazepril Hcl) .... Take 1 By Mouth Once Daily 4)  Adult Aspirin Ec Low Strength 81 Mg Tbec (Aspirin) .Marland Kitchen.. 1 By Mouth Once Daily 5)  Dulera 100-5 Mcg/act Aero (Mometasone Furo-Formoterol Fum) .... 2 Spray/side Once Daily 6)  Proair Hfa 108 (90 Base) Mcg/act Aers (Albuterol Sulfate) .... 2 Pufss Four Times Per Day As Needed For Shortness of Breath 7)  Allopurinol 100 Mg Tabs (Allopurinol) .Marland Kitchen.. 1po Once Daily  Current Medications (verified): 1)  Crestor 20 Mg Tabs (Rosuvastatin Calcium) .... Take 1 Tablet By Mouth Once A Day 2)  Nexium 40 Mg Cpdr (Esomeprazole Magnesium) .... Take 1 Tablet By Mouth Once A Day 3)  Benazepril Hcl 5 Mg Tabs (Benazepril Hcl) .... Take 1 By Mouth Once Daily 4)  Adult Aspirin Ec Low Strength 81 Mg Tbec (Aspirin) .Marland Kitchen.. 1 By Mouth Once Daily 5)  Dulera 100-5 Mcg/act Aero (Mometasone Furo-Formoterol Fum) .... 2 Spray/side Once Daily 6)  Proair Hfa 108 (90 Base) Mcg/act Aers (Albuterol Sulfate) .Marland KitchenMarland KitchenMarland Kitchen  2 Pufss Four Times Per Day As Needed For Shortness of Breath 7)  Allopurinol 100 Mg Tabs (Allopurinol) .Marland Kitchen.. 1po Once Daily 8)  Levofloxacin 500 Mg Tabs (Levofloxacin) .Marland Kitchen.. 1po Once Daily 9)  Tussionex Pennkinetic Er 10-8 Mg/67ml Lqcr (Hydrocod Polst-Chlorphen Polst) .Marland Kitchen.. 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 PREVENTIVE HEALTH CARE (ICD-V70.0) ADD (ICD-314.00) HYPERLIPIDEMIA (ICD-272.4) HYPERTENSION (ICD-401.9) GERD (ICD-530.81) Gout  Past Surgical History: Last updated: 07/09/2008 s/p basal cell cancer s/p right elbow  surgury /p ankle surgury  Social History: Last updated: 12/07/2010 [employer] - work Former Smoker Alcohol use-yes Married 3 daughters Drug use-no  Risk Factors: Smoking Status: quit (07/09/2008)  Social History: [employer] - work Former Smoker Alcohol use-yes Married 3 daughters Drug use-no Drug Use:  no  Review of Systems       all otherwise negative per pt -    Physical Exam  General:  alert and overweight-appearing.  , mild ill  Head:  normocephalic and atraumatic.   Eyes:  vision grossly intact, pupils equal, and pupils round.   Ears:  bilat tm's mild erythema, sinus nontender, canals clear Nose:  nasal dischargemucosal pallor and mucosal edema.   Mouth:  pharyngeal erythema and fair dentition.   Neck:  supple and cervical lymphadenopathy.   Lungs:  normal respiratory effort, R decreased breath sounds, and L decreased breath sounds. but no frank rales or wheezing  Heart:  normal rate and regular rhythm.   Abdomen:  soft, non-tender, and normal bowel sounds.   Extremities:  no edema, no erythema    Impression & Recommendations:  Problem # 1:  BRONCHITIS-ACUTE (ICD-466.0)  His updated medication list for this problem includes:    Dulera 100-5 Mcg/act Aero (Mometasone furo-formoterol fum) .Marland Kitchen... 2 spray/side once daily    Proair Hfa 108 (90 Base) Mcg/act Aers (Albuterol sulfate) .Marland Kitchen... 2 pufss four times per day as needed for shortness of breath    Levofloxacin 500 Mg Tabs (Levofloxacin) .Marland Kitchen... 1po once daily    Tussionex Pennkinetic Er 10-8 Mg/64ml Lqcr (Hydrocod polst-chlorphen polst) .Marland Kitchen... 1 tsp by mouth two times a day as needed treat as above, f/u any worsening signs or symptoms   Take antibiotics and other medications as directed. Encouraged to push clear liquids, get enough rest, and take acetaminophen as needed. To be seen in 5-7 days if no improvement, sooner if worse.  Problem # 2:  HYPERTENSION (ICD-401.9)  His updated medication list for  this problem includes:    Benazepril Hcl 5 Mg Tabs (Benazepril hcl) .Marland Kitchen... Take 1 by mouth once daily  BP today: 112/72 Prior BP: 110/80 (06/30/2010)  Labs Reviewed: K+: 4.3 (06/28/2010) Creat: : 1.2 (06/28/2010)   Chol: 121 (06/28/2010)   HDL: 41.30 (06/28/2010)   LDL: 72 (06/28/2010)   TG: 40.0 (06/28/2010) stable overall by hx and exam, ok to continue meds/tx as is   Problem # 3:  GERD (ICD-530.81)  His updated medication list for this problem includes:    Nexium 40 Mg Cpdr (Esomeprazole magnesium) .Marland Kitchen... Take 1 tablet by mouth once a day  Labs Reviewed: Hgb: 13.3 (06/28/2010)   Hct: 38.9 (06/28/2010) stable overall by hx and exam, ok to continue meds/tx as is   Complete Medication List: 1)  Crestor 20 Mg Tabs (Rosuvastatin calcium) .... Take 1 tablet by mouth once a day 2)  Nexium 40 Mg Cpdr (  Esomeprazole magnesium) .... Take 1 tablet by mouth once a day 3)  Benazepril Hcl 5 Mg Tabs (Benazepril hcl) .... Take 1 by mouth once daily 4)  Adult Aspirin Ec Low Strength 81 Mg Tbec (Aspirin) .Marland Kitchen.. 1 by mouth once daily 5)  Dulera 100-5 Mcg/act Aero (Mometasone furo-formoterol fum) .... 2 spray/side once daily 6)  Proair Hfa 108 (90 Base) Mcg/act Aers (Albuterol sulfate) .... 2 pufss four times per day as needed for shortness of breath 7)  Allopurinol 100 Mg Tabs (Allopurinol) .Marland Kitchen.. 1po once daily 8)  Levofloxacin 500 Mg Tabs (Levofloxacin) .Marland Kitchen.. 1po once daily 9)  Tussionex Pennkinetic Er 10-8 Mg/32ml Lqcr (Hydrocod polst-chlorphen polst) .Marland Kitchen.. 1 tsp by mouth two times a day as needed  Patient Instructions: 1)  Please take all new medications as prescribed 2)  Continue all previous medications as before this visit  3)  You can also use Mucinex OTC or it's generic for congestion  4)  Please schedule a follow-up appointment as needed. Prescriptions: Sandria Senter ER 10-8 MG/5ML LQCR (HYDROCOD POLST-CHLORPHEN POLST) 1 tsp by mouth two times a day as needed  #6oz x 1   Entered and  Authorized by:   Corwin Levins MD   Signed by:   Corwin Levins MD on 12/07/2010   Method used:   Print then Give to Patient   RxID:   [id number] LEVOFLOXACIN 500 MG TABS (LEVOFLOXACIN) 1po once daily  #7 x 0   Entered and Authorized by:   Corwin Levins MD   Signed by:   Corwin Levins MD on 12/07/2010   Method used:   Print then Give to Patient   RxID:   [id number]    Orders Added: 1)  Est. Patient Level IV [66440]

## 2011-04-19 ENCOUNTER — Ambulatory Visit: Payer: BC Managed Care – PPO | Admitting: Internal Medicine

## 2011-04-19 ENCOUNTER — Telehealth: Payer: Self-pay | Admitting: Internal Medicine

## 2011-04-19 MED ORDER — LEVOFLOXACIN 500 MG PO TABS: 500.0000 mg | ORAL_TABLET | Freq: Every day | ORAL | Status: DC

## 2011-04-19 NOTE — Telephone Encounter (Signed)
Pt called -  mild to mod 2-3 days ST, HA, general weakness and malaise, with prod cough greenish sputum, but Pt denies chest pain, increased sob or doe, wheezing, orthopnea, PND, increased LE swelling, palpitations, dizziness or syncope.  Similar to last visit, Did well with levaquin. Going out of town to [place].

## 2011-06-26 ENCOUNTER — Telehealth: Payer: Self-pay

## 2011-06-26 ENCOUNTER — Other Ambulatory Visit: Payer: Self-pay | Admitting: Internal Medicine

## 2011-06-26 DIAGNOSIS — Z0000 Encounter for general adult medical examination without abnormal findings: Secondary | ICD-10-CM

## 2011-06-26 NOTE — Telephone Encounter (Signed)
Put lab order in for patient scheduled for CPX in November.

## 2011-08-16 ENCOUNTER — Other Ambulatory Visit: Payer: Self-pay | Admitting: Internal Medicine

## 2011-09-07 ENCOUNTER — Encounter: Payer: Self-pay | Admitting: Internal Medicine

## 2011-09-07 DIAGNOSIS — Z0000 Encounter for general adult medical examination without abnormal findings: Secondary | ICD-10-CM | POA: Insufficient documentation

## 2011-09-10 ENCOUNTER — Other Ambulatory Visit (INDEPENDENT_AMBULATORY_CARE_PROVIDER_SITE_OTHER): Payer: BC Managed Care – PPO

## 2011-09-10 DIAGNOSIS — Z0000 Encounter for general adult medical examination without abnormal findings: Secondary | ICD-10-CM

## 2011-09-10 LAB — CBC WITH DIFFERENTIAL/PLATELET
Basophils Relative: 0.5 % (ref 0.0–3.0)
Eosinophils Absolute: 0.2 10*3/uL (ref 0.0–0.7)
MCHC: 33.6 g/dL (ref 30.0–36.0)
MCV: 89.6 fl (ref 78.0–100.0)
Monocytes Absolute: 0.4 10*3/uL (ref 0.1–1.0)
Neutrophils Relative %: 66.6 % (ref 43.0–77.0)
RBC: 4.72 Mil/uL (ref 4.22–5.81)

## 2011-09-10 LAB — URINALYSIS, ROUTINE W REFLEX MICROSCOPIC
Leukocytes, UA: NEGATIVE
Nitrite: NEGATIVE
Specific Gravity, Urine: 1.02 (ref 1.000–1.030)
pH: 7 (ref 5.0–8.0)

## 2011-09-10 LAB — BASIC METABOLIC PANEL
BUN: 23 mg/dL (ref 6–23)
Creatinine, Ser: 1.3 mg/dL (ref 0.4–1.5)
GFR: 57.23 mL/min — ABNORMAL LOW (ref >60.00)
Potassium: 4.4 mEq/L (ref 3.5–5.1)

## 2011-09-10 LAB — LIPID PANEL
Cholesterol: 161 mg/dL (ref 0–200)
Triglycerides: 64 mg/dL (ref 0.0–149.0)

## 2011-09-10 LAB — HEPATIC FUNCTION PANEL
AST: 30 U/L (ref 0–37)
Total Bilirubin: 0.8 mg/dL (ref 0.3–1.2)

## 2011-09-10 LAB — TSH: TSH: 1.04 u[IU]/mL (ref 0.35–5.50)

## 2011-09-12 ENCOUNTER — Encounter: Payer: Self-pay | Admitting: Internal Medicine

## 2011-09-12 ENCOUNTER — Ambulatory Visit (INDEPENDENT_AMBULATORY_CARE_PROVIDER_SITE_OTHER): Payer: BC Managed Care – PPO | Admitting: Internal Medicine

## 2011-09-12 VITALS — BP 126/78 | HR 58 | Temp 98.0°F | Ht 74.0 in | Wt 214.0 lb

## 2011-09-12 DIAGNOSIS — I340 Nonrheumatic mitral (valve) insufficiency: Secondary | ICD-10-CM

## 2011-09-12 DIAGNOSIS — Z0000 Encounter for general adult medical examination without abnormal findings: Secondary | ICD-10-CM

## 2011-09-12 HISTORY — DX: Nonrheumatic mitral (valve) insufficiency: I34.0

## 2011-09-12 NOTE — Progress Notes (Signed)
Subjective:    Patient ID: Oscar Dixon, male    DOB: 1946/04/21, 65 y.o.   MRN: [id number]  HPI Here for wellness and f/u;  Overall doing ok;  Pt denies CP, worsening SOB, DOE, wheezing, orthopnea, PND, worsening LE edema, palpitations, dizziness or syncope.  Pt denies neurological change such as new Headache, facial or extremity weakness.  Pt denies polydipsia, polyuria, or low sugar symptoms. Pt states overall good compliance with treatment and medications, good tolerability, and trying to follow lower cholesterol diet.  Pt denies worsening depressive symptoms, suicidal ideation or panic. No fever, wt loss, night sweats, loss of appetite, or other constitutional symptoms.  Pt states good ability with ADL's, low fall risk, home safety reviewed and adequate, no significant changes in hearing or vision, and occasionally active with exercise.  Admits to some noncompliacne with crestor, ASA lately and has been somewhat less active with several lbs wt gain Past Medical History  Diagnosis Date  . 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  . CARPAL TUNNEL SYNDROME, BILATERAL 12/15/2009  . DYSPNEA ON EXERTION 12/15/2009  . FATIGUE 03/27/2010  . GERD 05/20/2007  . GOUT 03/27/2010  . HAND PAIN 12/15/2009  . Headache 08/25/2009  . HERNIA, UMBILICAL 12/15/2009  . HYPERLIPIDEMIA 07/09/2008  . HYPERTENSION 05/20/2007  . LIBIDO, DECREASED 03/27/2010  . 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 Mitral regurgitation 09/12/2011   Past Surgical History  Procedure Date  . S/p basal cell cancer   . S/p right elbow surugury   . S/p ankle surgury     reports that he has quit smoking. He does not have any smokeless tobacco history on file. He reports that he drinks alcohol. He reports that he does not use illicit drugs. family history includes Alcohol abuse in his mother; Cancer in his brother; and Hypertension in his father and mother. No Known Allergies Current Outpatient Prescriptions on File Prior to Visit  Medication Sig  Dispense Refill  . allopurinol (ZYLOPRIM) 100 MG tablet Take 100 mg by mouth daily.        . benazepril (LOTENSIN) 5 MG tablet TAKE 1 TABLET DAILY  90 tablet  2  . CRESTOR 20 MG tablet TAKE 1 TABLET DAILY  90 tablet  2  . NEXIUM 40 MG capsule TAKE 1 CAPSULE DAILY  90 capsule  2  . aspirin 81 MG EC tablet Take 81 mg by mouth daily.         Review of Systems Review of Systems  Constitutional: Negative for diaphoresis, activity change, appetite change and unexpected weight change.  HENT: Negative for hearing loss, ear pain, facial swelling, mouth sores and neck stiffness.   Eyes: Negative for pain, redness and visual disturbance.  Respiratory: Negative for shortness of breath and wheezing.   Cardiovascular: Negative for chest pain and palpitations.  Gastrointestinal: Negative for diarrhea, blood in stool, abdominal distention and rectal pain.  Genitourinary: Negative for hematuria, flank pain and decreased urine volume.  Musculoskeletal: Negative for myalgias and joint swelling.  Skin: Negative for color change and wound.  Neurological: Negative for syncope and numbness.  Hematological: Negative for adenopathy.  Psychiatric/Behavioral: Negative for hallucinations, self-injury, decreased concentration and agitation.      Objective:   Physical Exam BP 126/78  Pulse 58  Temp(Src) 98 F (36.7 C) (Oral)  Ht 6\' 2"  (1.88 m)  Wt 214 lb (97.07 kg)  BMI 27.48 kg/m2  SpO2 97% Physical Exam  VS noted Constitutional: Pt is oriented to person, place, and time. Appears well-developed  and well-nourished.  HENT:  Head: Normocephalic and atraumatic.  Right Ear: External ear normal.  Left Ear: External ear normal.  Nose: Nose normal.  Mouth/Throat: Oropharynx is clear and moist.  Eyes: Conjunctivae and EOM are normal. Pupils are equal, round, and reactive to light.  Neck: Normal range of motion. Neck supple. No JVD present. No tracheal deviation present.  Cardiovascular: Normal rate, regular  rhythm, normal heart sounds and intact distal pulses.   Pulmonary/Chest: Effort normal and breath sounds normal.  Abdominal: Soft. Bowel sounds are normal. There is no tenderness.  Musculoskeletal: Normal range of motion. Exhibits no edema.  Lymphadenopathy:  Has no cervical adenopathy.  Neurological: Pt is alert and oriented to person, place, and time. Pt has normal reflexes. No cranial nerve deficit.  Skin: Skin is warm and dry. No rash noted.  Psychiatric:  Has  normal mood and affect. Behavior is normal.     Assessment & Plan:

## 2011-09-12 NOTE — Patient Instructions (Addendum)
Continue all other medications as before Please call if you need refills You are otherwise up to date with prevention today Please remember to focus on your lower cholesterol diet, exercise and weight control Please return in 1 year for your yearly visit, or sooner if needed, with Lab testing done 3-5 days before

## 2011-09-12 NOTE — Assessment & Plan Note (Signed)

## 2011-09-16 ENCOUNTER — Encounter: Payer: Self-pay | Admitting: Internal Medicine

## 2011-09-25 ENCOUNTER — Other Ambulatory Visit: Payer: Self-pay | Admitting: Internal Medicine

## 2011-11-28 ENCOUNTER — Encounter: Payer: Self-pay | Admitting: Endocrinology

## 2011-11-28 ENCOUNTER — Ambulatory Visit (INDEPENDENT_AMBULATORY_CARE_PROVIDER_SITE_OTHER): Payer: BC Managed Care – PPO | Admitting: Endocrinology

## 2011-11-28 DIAGNOSIS — E041 Nontoxic single thyroid nodule: Secondary | ICD-10-CM

## 2011-11-28 MED ORDER — PROMETHAZINE-CODEINE 6.25-10 MG/5ML PO SYRP: 5.0000 mL | ORAL_SOLUTION | ORAL | Status: AC | PRN

## 2011-11-28 MED ORDER — AZITHROMYCIN 500 MG PO TABS: 500.0000 mg | ORAL_TABLET | Freq: Every day | ORAL | Status: AC

## 2011-11-28 NOTE — Patient Instructions (Addendum)
Refer for an ultrasound of the thyroid.  you will receive a phone call, about a day and time for an appointment i have sent a prescription to your pharmacy, for an antibiotic. here is a prescription for cough syrup.   here is a sample of "symbicort-80."  take 1 puff 2x a day, as needed for wheezing.  rinse mouth after using.

## 2011-11-28 NOTE — Progress Notes (Signed)
  Subjective:    Patient ID: Oscar Dixon, male    DOB: [birth year], 66 y.o.   MRN: [id number]  HPI Pt states 1 week of moderate prod-quality cough in the chest, and assoc wheezing.   Past Medical History  Diagnosis Date  . 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  . CARPAL TUNNEL SYNDROME, BILATERAL 12/15/2009  . DYSPNEA ON EXERTION 12/15/2009  . FATIGUE 03/27/2010  . GERD 05/20/2007  . GOUT 03/27/2010  . HAND PAIN 12/15/2009  . Headache 08/25/2009  . HERNIA, UMBILICAL 12/15/2009  . HYPERLIPIDEMIA 07/09/2008  . HYPERTENSION 05/20/2007  . LIBIDO, DECREASED 03/27/2010  . 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 Mitral regurgitation 09/12/2011    Past Surgical History  Procedure Date  . S/p basal cell cancer   . S/p right elbow surugury   . S/p ankle surgury     History   Social History  . Marital Status: Married    Spouse Name: N/A    Number of Children: 3  . Years of Education: N/A   Occupational History  . [employer]     Social History Main Topics  . Smoking status: Former Games developer  . Smokeless tobacco: Not on file  . Alcohol Use: Yes  . Drug Use: No  . Sexually Active: Not on file   Other Topics Concern  . Not on file   Social History Narrative  . No narrative on file    Current Outpatient Prescriptions on File Prior to Visit  Medication Sig Dispense Refill  . allopurinol (ZYLOPRIM) 100 MG tablet TAKE 1 TABLET DAILY  90 tablet  2  . aspirin 81 MG EC tablet Take 81 mg by mouth daily.        . benazepril (LOTENSIN) 5 MG tablet TAKE 1 TABLET DAILY  90 tablet  2  . CRESTOR 20 MG tablet TAKE 1 TABLET DAILY  90 tablet  2  . NEXIUM 40 MG capsule TAKE 1 CAPSULE DAILY  90 capsule  2    No Known Allergies  Family History  Problem Relation Age of Onset  . Hypertension Mother   . Alcohol abuse Mother     ETOH  . Hypertension Father   . Cancer Brother     Lung cancer    BP 114/72  Pulse 70  Temp(Src) 97.4 F (36.3 C) (Oral)  Ht 6\' 2"  (1.88 m)  Wt 212 lb (96.163 kg)   BMI 27.22 kg/m2  SpO2 97%  Review of Systems He has fullness of both ears, but no fever.  No nasal congestion.      Objective:   Physical Exam VITAL SIGNS:  See vs page GENERAL: no distress head: no deformity eyes: no periorbital swelling, no proptosis external nose and ears are normal mouth: no lesion seen Both tm's are slightly red Neck: 2 cm left thyroid nodule LUNGS:  Clear to auscultation      Assessment & Plan:  Acute bronchitis, new Left thyroid nodule, incidentally noted

## 2011-11-30 ENCOUNTER — Other Ambulatory Visit (HOSPITAL_COMMUNITY): Payer: BC Managed Care – PPO

## 2012-02-28 ENCOUNTER — Other Ambulatory Visit: Payer: Self-pay | Admitting: Internal Medicine

## 2012-04-09 ENCOUNTER — Ambulatory Visit: Payer: BC Managed Care – PPO | Admitting: Internal Medicine

## 2012-04-10 ENCOUNTER — Encounter: Payer: Self-pay | Admitting: Internal Medicine

## 2012-04-10 ENCOUNTER — Ambulatory Visit (INDEPENDENT_AMBULATORY_CARE_PROVIDER_SITE_OTHER): Payer: BC Managed Care – PPO | Admitting: Internal Medicine

## 2012-04-10 VITALS — BP 102/80 | HR 70 | Temp 98.0°F | Ht 73.0 in | Wt 210.5 lb

## 2012-04-10 DIAGNOSIS — I10 Essential (primary) hypertension: Secondary | ICD-10-CM

## 2012-04-10 DIAGNOSIS — N529 Male erectile dysfunction, unspecified: Secondary | ICD-10-CM

## 2012-04-10 DIAGNOSIS — J209 Acute bronchitis, unspecified: Secondary | ICD-10-CM

## 2012-04-10 MED ORDER — SILDENAFIL CITRATE 100 MG PO TABS: 100.0000 mg | ORAL_TABLET | Freq: Every day | ORAL | Status: DC | PRN

## 2012-04-10 MED ORDER — LEVOFLOXACIN 250 MG PO TABS: 250.0000 mg | ORAL_TABLET | Freq: Every day | ORAL | Status: AC

## 2012-04-10 NOTE — Patient Instructions (Addendum)
Take all new medications as prescribed Continue all other medications as before  

## 2012-04-12 ENCOUNTER — Encounter: Payer: Self-pay | Admitting: Internal Medicine

## 2012-04-12 DIAGNOSIS — N529 Male erectile dysfunction, unspecified: Secondary | ICD-10-CM | POA: Insufficient documentation

## 2012-04-12 DIAGNOSIS — J209 Acute bronchitis, unspecified: Secondary | ICD-10-CM | POA: Insufficient documentation

## 2012-04-12 HISTORY — DX: Male erectile dysfunction, unspecified: N52.9

## 2012-04-12 NOTE — Assessment & Plan Note (Signed)
stable overall by hx and exam, most recent data reviewed with pt, and pt to continue medical treatment as before BP Readings from Last 3 Encounters:  04/10/12 102/80  11/28/11 114/72  09/12/11 126/78

## 2012-04-12 NOTE — Assessment & Plan Note (Signed)
Mild to mod, for PDE4 prn,  to f/u any worsening symptoms or concerns

## 2012-04-12 NOTE — Assessment & Plan Note (Signed)
Mild to mod, for antibx course,  to f/u any worsening symptoms or concerns 

## 2012-04-12 NOTE — Progress Notes (Signed)
Subjective:    Patient ID: Oscar Dixon, male    DOB: Nov 17, 1945, 66 y.o.   MRN: [id number]  HPI  Here with acute onset mild to mod 2-3 days ST, HA, general weakness and malaise, with prod cough greenish sputum, but Pt denies chest pain, increased sob or doe, wheezing, orthopnea, PND, increased LE swelling, palpitations, dizziness or syncope.  Pt denies new neurological symptoms such as new headache, or facial or extremity weakness or numbness   Pt denies polydipsia, polyuria.  Has also had worsening ED symptoms in the past 6 months, asks for ED med Past Medical History  Diagnosis Date  . 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  . CARPAL TUNNEL SYNDROME, BILATERAL 12/15/2009  . DYSPNEA ON EXERTION 12/15/2009  . FATIGUE 03/27/2010  . GERD 05/20/2007  . GOUT 03/27/2010  . HAND PAIN 12/15/2009  . Headache 08/25/2009  . HERNIA, UMBILICAL 12/15/2009  . HYPERLIPIDEMIA 07/09/2008  . HYPERTENSION 05/20/2007  . LIBIDO, DECREASED 03/27/2010  . 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 Mitral regurgitation 09/12/2011  . Erectile dysfunction 04/12/2012   Past Surgical History  Procedure Date  . S/p basal cell cancer   . S/p right elbow surugury   . S/p ankle surgury     reports that he has quit smoking. He does not have any smokeless tobacco history on file. He reports that he drinks alcohol. He reports that he does not use illicit drugs. family history includes Alcohol abuse in his mother; Cancer in his brother; and Hypertension in his father and mother. No Known Allergies Current Outpatient Prescriptions on File Prior to Visit  Medication Sig Dispense Refill  . allopurinol (ZYLOPRIM) 100 MG tablet TAKE 1 TABLET DAILY  90 tablet  2  . aspirin 81 MG EC tablet Take 81 mg by mouth daily.        . benazepril (LOTENSIN) 5 MG tablet TAKE 1 TABLET DAILY  90 tablet  2  . CRESTOR 20 MG tablet TAKE 1 TABLET DAILY  90 tablet  1  . NEXIUM 40 MG capsule TAKE 1 CAPSULE DAILY  90 capsule  1  . sildenafil (VIAGRA) 100 MG tablet  Take 1 tablet (100 mg total) by mouth daily as needed for erectile dysfunction.  5 tablet  11   Review of Systems Review of Systems  Constitutional: Negative for diaphoresis and unexpected weight change.  Eyes: Negative for photophobia and visual disturbance.  Respiratory: Negative for choking and stridor.   Gastrointestinal: Negative for vomiting and blood in stool.  Genitourinary: Negative for hematuria and decreased urine volume.  Musculoskeletal: Negative for gait problem.  Skin: Negative for color change and wound.  Neurological: Negative for tremors and numbness.  Psychiatric/Behavioral: Negative for decreased concentration. The patient is not hyperactive.      Objective:   Physical Exam BP 102/80  Pulse 70  Temp(Src) 98 F (36.7 C) (Oral)  Ht 6\' 1"  (1.854 m)  Wt 210 lb 8 oz (95.482 kg)  BMI 27.77 kg/m2  SpO2 97% Physical Exam  VS noted, mild ill Constitutional: Pt appears well-developed and well-nourished.  HENT: Head: Normocephalic.  Right Ear: External ear normal.  Left Ear: External ear normal.  Bilat tm's mild erythema.  Sinus nontender.  Pharynx mild erythema Eyes: Conjunctivae and EOM are normal. Pupils are equal, round, and reactive to light.  Neck: Normal range of motion. Neck supple.  Cardiovascular: Normal rate and regular rhythm.   Pulmonary/Chest: Effort normal and breath sounds normal.  Neurological: Pt is alert. Not confused  Skin: Skin is warm. No erythema.  Psychiatric: Pt behavior is normal. Thought content normal.     Assessment & Plan:

## 2012-04-19 ENCOUNTER — Other Ambulatory Visit: Payer: Self-pay | Admitting: Internal Medicine

## 2012-05-29 ENCOUNTER — Other Ambulatory Visit: Payer: Self-pay | Admitting: Internal Medicine

## 2012-06-23 ENCOUNTER — Telehealth: Payer: Self-pay

## 2012-06-23 DIAGNOSIS — Z0000 Encounter for general adult medical examination without abnormal findings: Secondary | ICD-10-CM

## 2012-06-23 NOTE — Telephone Encounter (Signed)
Put order in for physical labs. 

## 2012-08-07 ENCOUNTER — Other Ambulatory Visit: Payer: Self-pay | Admitting: Internal Medicine

## 2012-09-12 ENCOUNTER — Other Ambulatory Visit (INDEPENDENT_AMBULATORY_CARE_PROVIDER_SITE_OTHER): Payer: BC Managed Care – PPO

## 2012-09-12 DIAGNOSIS — Z0000 Encounter for general adult medical examination without abnormal findings: Secondary | ICD-10-CM

## 2012-09-12 LAB — LIPID PANEL
Cholesterol: 127 mg/dL (ref 0–200)
HDL: 44 mg/dL (ref >39.00)
LDL Cholesterol: 73 mg/dL (ref 0–99)
VLDL: 10.4 mg/dL (ref 0.0–40.0)

## 2012-09-12 LAB — CBC WITH DIFFERENTIAL/PLATELET
Basophils Absolute: 0 10*3/uL (ref 0.0–0.1)
Eosinophils Absolute: 0.2 10*3/uL (ref 0.0–0.7)
Eosinophils Relative: 3 % (ref 0.0–5.0)
HCT: 42.8 % (ref 39.0–52.0)
Lymphs Abs: 1.6 10*3/uL (ref 0.7–4.0)
MCHC: 33.4 g/dL (ref 30.0–36.0)
MCV: 89.7 fl (ref 78.0–100.0)
Monocytes Absolute: 0.4 10*3/uL (ref 0.1–1.0)
Neutrophils Relative %: 63.6 % (ref 43.0–77.0)
Platelets: 185 10*3/uL (ref 150.0–400.0)
RDW: 12.8 % (ref 11.5–14.6)

## 2012-09-12 LAB — PSA: PSA: 1.42 ng/mL (ref 0.10–4.00)

## 2012-09-12 LAB — URINALYSIS, ROUTINE W REFLEX MICROSCOPIC
Bilirubin Urine: NEGATIVE
Hgb urine dipstick: NEGATIVE
Ketones, ur: NEGATIVE
Leukocytes, UA: NEGATIVE
Nitrite: NEGATIVE
Urobilinogen, UA: 0.2 (ref 0.0–1.0)
pH: 6.5 (ref 5.0–8.0)

## 2012-09-12 LAB — HEPATIC FUNCTION PANEL
ALT: 25 U/L (ref 0–53)
Bilirubin, Direct: 0.2 mg/dL (ref 0.0–0.3)
Total Bilirubin: 0.8 mg/dL (ref 0.3–1.2)

## 2012-09-12 LAB — BASIC METABOLIC PANEL
Chloride: 107 mEq/L (ref 96–112)
GFR: 58.05 mL/min — ABNORMAL LOW (ref >60.00)
Glucose, Bld: 99 mg/dL (ref 70–99)
Potassium: 4.7 mEq/L (ref 3.5–5.1)
Sodium: 142 mEq/L (ref 135–145)

## 2012-09-18 ENCOUNTER — Encounter: Payer: Self-pay | Admitting: Internal Medicine

## 2012-09-18 ENCOUNTER — Ambulatory Visit (INDEPENDENT_AMBULATORY_CARE_PROVIDER_SITE_OTHER): Payer: BC Managed Care – PPO | Admitting: Internal Medicine

## 2012-09-18 VITALS — BP 120/70 | HR 60 | Temp 96.9°F | Ht 74.0 in | Wt 213.0 lb

## 2012-09-18 DIAGNOSIS — Z23 Encounter for immunization: Secondary | ICD-10-CM

## 2012-09-18 DIAGNOSIS — Z0000 Encounter for general adult medical examination without abnormal findings: Secondary | ICD-10-CM

## 2012-09-18 NOTE — Progress Notes (Signed)
Subjective:    Patient ID: Oscar Dixon, male    DOB: [birth year], 66 y.o.   MRN: [id number]  HPI  Here for wellness and f/u;  Overall doing ok;  Pt denies CP, worsening SOB, DOE, wheezing, orthopnea, PND, worsening LE edema, palpitations, dizziness or syncope.  Pt denies neurological change such as new Headache, facial or extremity weakness.  Pt denies polydipsia, polyuria, or low sugar symptoms. Pt states overall good compliance with treatment and medications, good tolerability, and trying to follow lower cholesterol diet.  Pt denies worsening depressive symptoms, suicidal ideation or panic. No fever, wt loss, night sweats, loss of appetite, or other constitutional symptoms.  Pt states good ability with ADL's, low fall risk, home safety reviewed and adequate, no significant changes in hearing or vision, and occasionally active with exercise.  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  Past Medical History  Diagnosis Date  . 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  . CARPAL TUNNEL SYNDROME, BILATERAL 12/15/2009  . DYSPNEA ON EXERTION 12/15/2009  . FATIGUE 03/27/2010  . GERD 05/20/2007  . GOUT 03/27/2010  . HAND PAIN 12/15/2009  . Headache 08/25/2009  . HERNIA, UMBILICAL 12/15/2009  . HYPERLIPIDEMIA 07/09/2008  . HYPERTENSION 05/20/2007  . LIBIDO, DECREASED 03/27/2010  . 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 Mitral regurgitation 09/12/2011  . Erectile dysfunction 04/12/2012   Past Surgical History  Procedure Date  . S/p basal cell cancer   . S/p right elbow surugury   . S/p ankle surgury     reports that he has quit smoking. He does not have any smokeless tobacco history on file. He reports that he drinks alcohol. He reports that he does not use illicit drugs. family history includes Alcohol abuse in his mother; Cancer in  his brother; and Hypertension in his father and mother. No Known Allergies , Current Outpatient Prescriptions on File Prior to Visit  Medication Sig Dispense Refill  . allopurinol (ZYLOPRIM) 100 MG tablet TAKE 1 TABLET DAILY  90 tablet  3  . aspirin 81 MG EC tablet Take 81 mg by mouth daily.        . benazepril (LOTENSIN) 5 MG tablet TAKE 1 TABLET DAILY  90 tablet  3  . CRESTOR 20 MG tablet TAKE 1 TABLET DAILY  90 tablet  3  . NEXIUM 40 MG capsule TAKE 1 CAPSULE DAILY  90 capsule  3  . sildenafil (VIAGRA) 100 MG tablet Take 1 tablet (100 mg total) by mouth daily as needed for erectile dysfunction.  5 tablet  11   Review of Systems Review of Systems  Constitutional: Negative for diaphoresis, activity change, appetite change and unexpected weight change.  HENT: Negative for hearing loss, ear pain, facial swelling, mouth sores and neck stiffness.   Eyes: Negative for pain, redness and visual disturbance.  Respiratory: Negative for shortness of breath and wheezing.   Cardiovascular: Negative for chest pain and palpitations.  Gastrointestinal: Negative for diarrhea, blood in stool, abdominal distention and rectal pain.  Genitourinary: Negative for hematuria, flank pain and decreased urine volume.  Musculoskeletal: Negative for myalgias and joint swelling.  Skin: Negative for color change and wound.  Neurological: Negative for syncope and numbness.  Hematological: Negative for adenopathy.  Psychiatric/Behavioral: Negative for hallucinations, self-injury, decreased concentration and agitation.  Objective:   Physical Exam BP 120/70  Pulse 60  Temp 96.9 F (36.1 C) (Oral)  Ht 6\' 2"  (1.88 m)  Wt 213 lb (96.616 kg)  BMI 27.35 kg/m2  SpO2 96% Physical Exam  VS noted Constitutional: Pt is oriented to person, place, and time. Appears well-developed and well-nourished.  HENT:  Head: Normocephalic and atraumatic.  Right Ear: External ear normal.  Left Ear: External ear normal.  Nose:  Nose normal.  Mouth/Throat: Oropharynx is clear and moist.  Eyes: Conjunctivae and EOM are normal. Pupils are equal, round, and reactive to light.  Neck: Normal range of motion. Neck supple. No JVD present. No tracheal deviation present.  Cardiovascular: Normal rate, regular rhythm, normal heart sounds and intact distal pulses.   Pulmonary/Chest: Effort normal and breath sounds normal.  Abdominal: Soft. Bowel sounds are normal. There is no tenderness.  Musculoskeletal: Normal range of motion. Exhibits no edema.  Lymphadenopathy:  Has no cervical adenopathy.  Neurological: Pt is alert and oriented to person, place, and time. Pt has normal reflexes. No cranial nerve deficit.  Skin: Skin is warm and dry. No rash noted.  Psychiatric:  Has  normal mood and affect. Behavior is normal.     Assessment & Plan:

## 2012-09-18 NOTE — Assessment & Plan Note (Signed)

## 2012-09-18 NOTE — Patient Instructions (Addendum)
You had the flu shot today Continue all other medications as before Please have the pharmacy call with any other refills you may need. Please continue your efforts at being more active, low cholesterol diet, and weight control. You are otherwise up to date with prevention Thank you for enrolling in MyChart. Please follow the instructions below to securely access your online medical record. MyChart allows you to send messages to your doctor, view your test results, renew your prescriptions, schedule appointments, and more. Please return in 1 year for your yearly visit, or sooner if needed, with Lab testing done 3-5 days before

## 2012-09-23 ENCOUNTER — Encounter: Payer: Self-pay | Admitting: Internal Medicine

## 2013-03-22 ENCOUNTER — Other Ambulatory Visit: Payer: Self-pay | Admitting: Internal Medicine

## 2013-04-08 ENCOUNTER — Other Ambulatory Visit: Payer: Self-pay | Admitting: Internal Medicine

## 2013-04-22 ENCOUNTER — Other Ambulatory Visit: Payer: Self-pay | Admitting: Internal Medicine

## 2013-07-06 ENCOUNTER — Other Ambulatory Visit: Payer: Self-pay | Admitting: Internal Medicine

## 2013-09-09 ENCOUNTER — Other Ambulatory Visit (INDEPENDENT_AMBULATORY_CARE_PROVIDER_SITE_OTHER): Payer: Medicare Other

## 2013-09-09 DIAGNOSIS — Z0000 Encounter for general adult medical examination without abnormal findings: Secondary | ICD-10-CM | POA: Diagnosis not present

## 2013-09-09 LAB — BASIC METABOLIC PANEL
BUN: 22 mg/dL (ref 6–23)
CO2: 27 mEq/L (ref 19–32)
Calcium: 9.5 mg/dL (ref 8.4–10.5)
Creatinine, Ser: 1.2 mg/dL (ref 0.4–1.5)
GFR: 67.17 mL/min (ref >60.00)
Glucose, Bld: 106 mg/dL — ABNORMAL HIGH (ref 70–99)
Sodium: 140 mEq/L (ref 135–145)

## 2013-09-09 LAB — URINALYSIS, ROUTINE W REFLEX MICROSCOPIC
Bilirubin Urine: NEGATIVE
Hgb urine dipstick: NEGATIVE
Total Protein, Urine: NEGATIVE
Urine Glucose: NEGATIVE
pH: 6 (ref 5.0–8.0)

## 2013-09-09 LAB — CBC WITH DIFFERENTIAL/PLATELET
Basophils Absolute: 0 10*3/uL (ref 0.0–0.1)
Eosinophils Relative: 3.5 % (ref 0.0–5.0)
HCT: 41.2 % (ref 39.0–52.0)
Hemoglobin: 14.2 g/dL (ref 13.0–17.0)
Lymphocytes Relative: 23.5 % (ref 12.0–46.0)
Lymphs Abs: 1.5 10*3/uL (ref 0.7–4.0)
Monocytes Relative: 5.9 % (ref 3.0–12.0)
Platelets: 195 10*3/uL (ref 150.0–400.0)
WBC: 6.6 10*3/uL (ref 4.5–10.5)

## 2013-09-09 LAB — TSH: TSH: 1.23 u[IU]/mL (ref 0.35–5.50)

## 2013-09-09 LAB — HEPATIC FUNCTION PANEL
ALT: 26 U/L (ref 0–53)
AST: 24 U/L (ref 0–37)
Alkaline Phosphatase: 40 U/L (ref 39–117)
Total Bilirubin: 0.7 mg/dL (ref 0.3–1.2)

## 2013-09-09 LAB — LIPID PANEL
HDL: 49.4 mg/dL (ref >39.00)
Total CHOL/HDL Ratio: 3

## 2013-09-10 ENCOUNTER — Other Ambulatory Visit: Payer: Self-pay

## 2013-09-18 ENCOUNTER — Encounter: Payer: Self-pay | Admitting: Internal Medicine

## 2013-09-18 ENCOUNTER — Ambulatory Visit (INDEPENDENT_AMBULATORY_CARE_PROVIDER_SITE_OTHER): Payer: Medicare Other | Admitting: Internal Medicine

## 2013-09-18 VITALS — BP 140/82 | HR 65 | Temp 97.5°F | Ht 74.0 in | Wt 209.5 lb

## 2013-09-18 DIAGNOSIS — Z23 Encounter for immunization: Secondary | ICD-10-CM | POA: Diagnosis not present

## 2013-09-18 DIAGNOSIS — I10 Essential (primary) hypertension: Secondary | ICD-10-CM | POA: Diagnosis not present

## 2013-09-18 DIAGNOSIS — Z0000 Encounter for general adult medical examination without abnormal findings: Secondary | ICD-10-CM | POA: Diagnosis not present

## 2013-09-18 MED ORDER — BENAZEPRIL HCL 10 MG PO TABS: 10.0000 mg | ORAL_TABLET | Freq: Every day | ORAL | Status: DC

## 2013-09-18 NOTE — Assessment & Plan Note (Signed)

## 2013-09-18 NOTE — Progress Notes (Signed)
Subjective:    Patient ID: Oscar Dixon, male    DOB: [birth year], 67 y.o.   MRN: [id number]  HPI  Here for wellness and f/u;  Overall doing ok;  Pt denies CP, worsening SOB, DOE, wheezing, orthopnea, PND, worsening LE edema, palpitations, dizziness or syncope.  Pt denies neurological change such as new headache, facial or extremity weakness.  Pt denies polydipsia, polyuria, or low sugar symptoms. Pt states overall good compliance with treatment and medications, good tolerability, and has been trying to follow lower cholesterol diet.  Pt denies worsening depressive symptoms, suicidal ideation or panic. No fever, night sweats, wt loss, loss of appetite, or other constitutional symptoms.  Pt states good ability with ADL's, has low fall risk, home safety reviewed and adequate, no other significant changes in hearing or vision, and only occasionally active with exercise.  For flu shot today, No acute complaints Past Medical History  Diagnosis Date  . 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  . CARPAL TUNNEL SYNDROME, BILATERAL 12/15/2009  . DYSPNEA ON EXERTION 12/15/2009  . FATIGUE 03/27/2010  . GERD 05/20/2007  . GOUT 03/27/2010  . HAND PAIN 12/15/2009  . Headache(784.0) 08/25/2009  . HERNIA, UMBILICAL 12/15/2009  . HYPERLIPIDEMIA 07/09/2008  . HYPERTENSION 05/20/2007  . LIBIDO, DECREASED 03/27/2010  . 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 Mitral regurgitation 09/12/2011  . Erectile dysfunction 04/12/2012   Past Surgical History  Procedure Laterality Date  . S/p basal cell cancer    . S/p right elbow surugury    . S/p ankle surgury      reports that he has quit smoking. He does not have any smokeless tobacco history on file. He reports that he drinks alcohol. He reports that he does not use illicit drugs. family history includes Alcohol abuse in his mother; Cancer in his brother; Hypertension in his father and mother. No Known Allergies Current Outpatient Prescriptions on File Prior to Visit  Medication Sig  Dispense Refill  . allopurinol (ZYLOPRIM) 100 MG tablet TAKE 1 TABLET DAILY  90 tablet  3  . aspirin 81 MG EC tablet Take 81 mg by mouth daily.        . CRESTOR 20 MG tablet TAKE 1 TABLET DAILY  90 tablet  0  . NEXIUM 40 MG capsule TAKE 1 CAPSULE DAILY  90 capsule  0  . VIAGRA 100 MG tablet take 1 tablet by mouth once daily if needed for ERECTILE DYSFUNCTION  5 tablet  11   No current facility-administered medications on file prior to visit.   Review of Systems Constitutional: Negative for diaphoresis, activity change, appetite change or unexpected weight change.  HENT: Negative for hearing loss, ear pain, facial swelling, mouth sores and neck stiffness.   Eyes: Negative for pain, redness and visual disturbance.  Respiratory: Negative for shortness of breath and wheezing.   Cardiovascular: Negative for chest pain and palpitations.  Gastrointestinal: Negative for diarrhea, blood in stool, abdominal distention or other pain Genitourinary: Negative for hematuria, flank pain or change in urine volume.  Musculoskeletal: Negative for myalgias and joint swelling.  Skin: Negative for color change and wound.  Neurological: Negative for syncope and numbness. other than noted Hematological: Negative for adenopathy.  Psychiatric/Behavioral: Negative for hallucinations, self-injury, decreased concentration and agitation.      Objective:   Physical Exam BP 140/82  Pulse 65  Temp(Src) 97.5 F (36.4 C) (Oral)  Ht 6\' 2"  (1.88 m)  Wt 209 lb 8 oz (95.029 kg)  BMI 26.89 kg/m2  SpO2  96% VS noted,  Constitutional: Pt is oriented to person, place, and time. Appears well-developed and well-nourished.  Head: Normocephalic and atraumatic.  Right Ear: External ear normal.  Left Ear: External ear normal.  Nose: Nose normal.  Mouth/Throat: Oropharynx is clear and moist.  Eyes: Conjunctivae and EOM are normal. Pupils are equal, round, and reactive to light.  Neck: Normal range of motion. Neck supple. No  JVD present. No tracheal deviation present.  Cardiovascular: Normal rate, regular rhythm, normal heart sounds and intact distal pulses.   Pulmonary/Chest: Effort normal and breath sounds normal.  Abdominal: Soft. Bowel sounds are normal. There is no tenderness. No HSM  Musculoskeletal: Normal range of motion. Exhibits no edema.  Lymphadenopathy:  Has no cervical adenopathy.  Neurological: Pt is alert and oriented to person, place, and time. Pt has normal reflexes. No cranial nerve deficit.  Skin: Skin is warm and dry. No rash noted.  Psychiatric:  Has  normal mood and affect. Behavior is normal.     Assessment & Plan:

## 2013-09-18 NOTE — Patient Instructions (Addendum)
Please make an appt for Nurse Visit in December 2014 for your Prevnar pneumonia shot OK to increase the benazepril to 10 mg per day Please continue to monitor your blood pressure as discussed, and call in 3 months if the SBP (top number) is more or less consistently over 130 Please continue all other medications as before, and refills have been done if requested. Please have the pharmacy call with any other refills you may need. Please continue your efforts at being more active, low cholesterol diet, and weight control. You are otherwise up to date with prevention measures today.  Please remember to sign up for My Chart if you have not done so, as this will be important to you in the future with finding out test results, communicating by private email, and scheduling acute appointments online when needed.  Please return in 1 year for your yearly visit, or sooner if needed, with Lab testing done 3-5 days before

## 2013-09-18 NOTE — Progress Notes (Signed)
Pre-visit discussion using our clinic review tool. No additional management support is needed unless otherwise documented below in the visit note.  

## 2013-09-20 NOTE — Assessment & Plan Note (Signed)
To incr benazepril to 10 mg

## 2013-09-23 ENCOUNTER — Other Ambulatory Visit: Payer: Self-pay

## 2013-09-23 ENCOUNTER — Encounter: Payer: Self-pay | Admitting: Internal Medicine

## 2013-09-23 MED ORDER — BENAZEPRIL HCL 20 MG PO TABS: 20.0000 mg | ORAL_TABLET | Freq: Every day | ORAL | Status: DC

## 2013-09-23 MED ORDER — AMLODIPINE BESYLATE 5 MG PO TABS: 5.0000 mg | ORAL_TABLET | Freq: Every day | ORAL | Status: DC

## 2013-09-23 NOTE — Telephone Encounter (Signed)
Patient has been informed of MD instructions.  Sent amlodipine to the local pharmacy per patient request.

## 2013-09-23 NOTE — Telephone Encounter (Signed)
Most likely BP is more resistant to tx than expected  OK to increase the benazepril to 20 mg per day, and add amlod 5 qd, with f/u OV 4 wks - both sent to [facility]

## 2013-10-03 DIAGNOSIS — J019 Acute sinusitis, unspecified: Secondary | ICD-10-CM | POA: Diagnosis not present

## 2013-10-03 DIAGNOSIS — J209 Acute bronchitis, unspecified: Secondary | ICD-10-CM | POA: Diagnosis not present

## 2013-10-04 ENCOUNTER — Other Ambulatory Visit: Payer: Self-pay | Admitting: Internal Medicine

## 2013-10-05 ENCOUNTER — Encounter: Payer: Self-pay | Admitting: Internal Medicine

## 2013-10-07 ENCOUNTER — Ambulatory Visit: Payer: Medicare Other

## 2013-10-07 ENCOUNTER — Ambulatory Visit (INDEPENDENT_AMBULATORY_CARE_PROVIDER_SITE_OTHER): Payer: BC Managed Care – PPO | Admitting: Internal Medicine

## 2013-10-07 ENCOUNTER — Encounter: Payer: Self-pay | Admitting: Internal Medicine

## 2013-10-07 VITALS — BP 110/82 | HR 86 | Temp 97.0°F | Wt 209.0 lb

## 2013-10-07 DIAGNOSIS — I10 Essential (primary) hypertension: Secondary | ICD-10-CM | POA: Diagnosis not present

## 2013-10-07 DIAGNOSIS — J019 Acute sinusitis, unspecified: Secondary | ICD-10-CM | POA: Diagnosis not present

## 2013-10-07 MED ORDER — FLUTICASONE PROPIONATE 50 MCG/ACT NA SUSP: 2.0000 | Freq: Every day | NASAL | Status: DC

## 2013-10-07 MED ORDER — AMOXICILLIN-POT CLAVULANATE 875-125 MG PO TABS: 1.0000 | ORAL_TABLET | Freq: Two times a day (BID) | ORAL | Status: DC

## 2013-10-07 NOTE — Assessment & Plan Note (Signed)
stable overall by history and exam, recent data reviewed with pt, and pt to continue medical treatment as before,  to f/u any worsening symptoms or concerns BP Readings from Last 3 Encounters:  10/07/13 110/82  09/18/13 140/82  09/18/12 120/70

## 2013-10-07 NOTE — Patient Instructions (Signed)
Please take all new medication as prescribed Please continue all other medications as before, and refills have been done if requested. Please have the pharmacy call with any other refills you may need.  Please remember to sign up for My Chart if you have not done so, as this will be important to you in the future with finding out test results, communicating by private email, and scheduling acute appointments online when needed.   

## 2013-10-07 NOTE — Assessment & Plan Note (Signed)
Mild to mod, for antibx course,  to f/u any worsening symptoms or concerns 

## 2013-10-07 NOTE — Progress Notes (Signed)
   Subjective:    Patient ID: Oscar Dixon, male    DOB: [birth year], 67 y.o.   MRN: [id number]  HPI   Here with 2-3 days acute onset fever, facial pain, pressure, headache, general weakness and malaise, and greenish d/c, with mild ST and cough with chest congestion, but pt denies chest pain, wheezing, increased sob or doe, orthopnea, PND, increased LE swelling, palpitations, dizziness or syncope, all despite recent zpack per UC. Pt denies new neurological symptoms such as new headache, or facial or extremity weakness or numbness   Pt denies polydipsia, polyuria,  Past Medical History  Diagnosis Date  . 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  . CARPAL TUNNEL SYNDROME, BILATERAL 12/15/2009  . DYSPNEA ON EXERTION 12/15/2009  . FATIGUE 03/27/2010  . GERD 05/20/2007  . GOUT 03/27/2010  . HAND PAIN 12/15/2009  . Headache(784.0) 08/25/2009  . HERNIA, UMBILICAL 12/15/2009  . HYPERLIPIDEMIA 07/09/2008  . HYPERTENSION 05/20/2007  . LIBIDO, DECREASED 03/27/2010  . 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 Mitral regurgitation 09/12/2011  . Erectile dysfunction 04/12/2012   Past Surgical History  Procedure Laterality Date  . S/p basal cell cancer    . S/p right elbow surugury    . S/p ankle surgury      reports that he has quit smoking. He does not have any smokeless tobacco history on file. He reports that he drinks alcohol. He reports that he does not use illicit drugs. family history includes Alcohol abuse in his mother; Cancer in his brother; Hypertension in his father and mother. No Known Allergies Current Outpatient Prescriptions on File Prior to Visit  Medication Sig Dispense Refill  . allopurinol (ZYLOPRIM) 100 MG tablet TAKE 1 TABLET DAILY  90 tablet  3  . amLODipine (NORVASC) 5 MG tablet Take 1 tablet (5 mg total) by mouth daily.  30 tablet  1  . aspirin 81 MG EC tablet Take 81 mg by mouth daily.        . benazepril (LOTENSIN) 20 MG tablet Take 1 tablet (20 mg total) by mouth daily.  90 tablet  3  .  CRESTOR 20 MG tablet TAKE 1 TABLET DAILY  90 tablet  3  . NEXIUM 40 MG capsule TAKE 1 CAPSULE DAILY  90 capsule  3  . VIAGRA 100 MG tablet take 1 tablet by mouth once daily if needed for ERECTILE DYSFUNCTION  5 tablet  11   No current facility-administered medications on file prior to visit.   Review of Systems  All otherwise neg per pt .       Objective:   Physical Exam BP 110/82  Pulse 86  Temp(Src) 97 F (36.1 C) (Oral)  Wt 209 lb (94.802 kg)  SpO2 99% VS noted, mild ill Constitutional: Pt appears well-developed and well-nourished.  HENT: Head: NCAT.  Right Ear: External ear normal.  Left Ear: External ear normal.  Bilat tm's with mild erythema.  Max sinus areas mild tender.  Pharynx with mild erythema, no exudate Eyes: Conjunctivae and EOM are normal. Pupils are equal, round, and reactive to light.  Neck: Normal range of motion. Neck supple.  Cardiovascular: Normal rate and regular rhythm.   Pulmonary/Chest: Effort normal and breath sounds normal.  Neurological: Pt is alert. Not confused  Skin: Skin is warm. No erythema.  Psychiatric: Pt behavior is normal. Thought content normal.     Assessment & Plan:

## 2013-10-07 NOTE — Progress Notes (Signed)
Pre-visit discussion using our clinic review tool. No additional management support is needed unless otherwise documented below in the visit note.  

## 2013-10-09 ENCOUNTER — Encounter: Payer: Self-pay | Admitting: Internal Medicine

## 2013-10-09 MED ORDER — LEVAQUIN 250 MG PO TABS: 250.0000 mg | ORAL_TABLET | Freq: Every day | ORAL | Status: DC

## 2013-10-13 ENCOUNTER — Encounter: Payer: Self-pay | Admitting: Internal Medicine

## 2013-10-27 ENCOUNTER — Encounter: Payer: Self-pay | Admitting: Internal Medicine

## 2013-10-27 ENCOUNTER — Ambulatory Visit (INDEPENDENT_AMBULATORY_CARE_PROVIDER_SITE_OTHER): Payer: Medicare Other | Admitting: Internal Medicine

## 2013-10-27 VITALS — BP 112/82 | HR 73 | Temp 97.0°F | Wt 207.4 lb

## 2013-10-27 DIAGNOSIS — I10 Essential (primary) hypertension: Secondary | ICD-10-CM | POA: Diagnosis not present

## 2013-10-27 DIAGNOSIS — J019 Acute sinusitis, unspecified: Secondary | ICD-10-CM | POA: Diagnosis not present

## 2013-10-27 DIAGNOSIS — Z23 Encounter for immunization: Secondary | ICD-10-CM

## 2013-10-27 DIAGNOSIS — H6990 Unspecified Eustachian tube disorder, unspecified ear: Secondary | ICD-10-CM | POA: Insufficient documentation

## 2013-10-27 DIAGNOSIS — H6980 Other specified disorders of Eustachian tube, unspecified ear: Secondary | ICD-10-CM | POA: Insufficient documentation

## 2013-10-27 DIAGNOSIS — H6983 Other specified disorders of Eustachian tube, bilateral: Secondary | ICD-10-CM

## 2013-10-27 NOTE — Assessment & Plan Note (Signed)
stable overall by history and exam, recent data reviewed with pt, and pt to continue medical treatment as before,  to f/u any worsening symptoms or concerns BP Readings from Last 3 Encounters:  10/27/13 112/82  10/07/13 110/82  09/18/13 140/82

## 2013-10-27 NOTE — Assessment & Plan Note (Signed)
For mucinex otc prn,  to f/u any worsening symptoms or concerns  

## 2013-10-27 NOTE — Assessment & Plan Note (Signed)
Resolved, d/w pt, reassured, no further antibx needed

## 2013-10-27 NOTE — Progress Notes (Signed)
Subjective:    Patient ID: Oscar Dixon, male    DOB: 1946/07/21, 67 y.o.   MRN: [id number]  HPI  Acute sinus symptoms resolved,b ut still with bilat ear fullness, popping, crackling. Pt denies chest pain, increased sob or doe, wheezing, orthopnea, PND, increased LE swelling, palpitations, dizziness or syncope.   Pt denies polydipsia, polyuria, Pt denies new neurological symptoms such as new headache, or facial or extremity weakness or numbness Past Medical History  Diagnosis Date  . 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  . CARPAL TUNNEL SYNDROME, BILATERAL 12/15/2009  . DYSPNEA ON EXERTION 12/15/2009  . FATIGUE 03/27/2010  . GERD 05/20/2007  . GOUT 03/27/2010  . HAND PAIN 12/15/2009  . Headache(784.0) 08/25/2009  . HERNIA, UMBILICAL 12/15/2009  . HYPERLIPIDEMIA 07/09/2008  . HYPERTENSION 05/20/2007  . LIBIDO, DECREASED 03/27/2010  . 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 Mitral regurgitation 09/12/2011  . Erectile dysfunction 04/12/2012   Past Surgical History  Procedure Laterality Date  . S/p basal cell cancer    . S/p right elbow surugury    . S/p ankle surgury      reports that he has quit smoking. He does not have any smokeless tobacco history on file. He reports that he drinks alcohol. He reports that he does not use illicit drugs. family history includes Alcohol abuse in his mother; Cancer in his brother; Hypertension in his father and mother. Allergies  Allergen Reactions  . Augmentin [Amoxicillin-Pot Clavulanate]     diarrhea   Current Outpatient Prescriptions on File Prior to Visit  Medication Sig Dispense Refill  . allopurinol (ZYLOPRIM) 100 MG tablet TAKE 1 TABLET DAILY  90 tablet  3  . amLODipine (NORVASC) 5 MG tablet Take 1 tablet (5 mg total) by mouth daily.  30 tablet  1  . aspirin 81 MG EC tablet Take 81 mg by mouth daily.        . benazepril (LOTENSIN) 20 MG tablet Take 1 tablet (20 mg total) by mouth daily.  90 tablet  3  . CRESTOR 20 MG tablet TAKE 1 TABLET DAILY  90 tablet   3  . fluticasone (FLONASE) 50 MCG/ACT nasal spray Place 2 sprays into both nostrils daily.  16 g  2  . NEXIUM 40 MG capsule TAKE 1 CAPSULE DAILY  90 capsule  3  . VIAGRA 100 MG tablet take 1 tablet by mouth once daily if needed for ERECTILE DYSFUNCTION  5 tablet  11   No current facility-administered medications on file prior to visit.     Review of Systems  Constitutional: Negative for unexpected weight change, or unusual diaphoresis  HENT: Negative for tinnitus.   Eyes: Negative for photophobia and visual disturbance.  Respiratory: Negative for choking and stridor.   Gastrointestinal: Negative for vomiting and blood in stool.  Genitourinary: Negative for hematuria and decreased urine volume.  Musculoskeletal: Negative for acute joint swelling Skin: Negative for color change and wound.  Neurological: Negative for tremors and numbness other than noted  Psychiatric/Behavioral: Negative for decreased concentration or  hyperactivity.       Objective:   Physical Exam BP 112/82  Pulse 73  Temp(Src) 97 F (36.1 C) (Oral)  Wt 207 lb 6 oz (94.065 kg)  SpO2 96% VS noted,  Constitutional: Pt appears well-developed and well-nourished.  HENT: Head: NCAT.  Right Ear: External ear normal.  Left Ear: External ear normal.  Bilat TMs mild erythema Eyes: Conjunctivae and EOM are normal. Pupils are equal, round, and reactive to  light.  Neck: Normal range of motion. Neck supple.  Cardiovascular: Normal rate and regular rhythm.   Pulmonary/Chest: Effort normal and breath sounds normal.  Neurological: Pt is alert. Not confused  Skin: Skin is warm. No erythema.  Psychiatric: Pt behavior is normal. Thought content normal.     Assessment & Plan:

## 2013-10-27 NOTE — Progress Notes (Signed)
Pre-visit discussion using our clinic review tool. No additional management support is needed unless otherwise documented below in the visit note.  

## 2013-10-27 NOTE — Patient Instructions (Signed)
You had the Prevnar pneumonia shot You can also take Delsym OTC for cough, and/or Mucinex (or it's generic off brand) for congestion, and tylenol as needed for pain. Please continue all other medications as before, and refills have been done if requested. Please have the pharmacy call with any other refills you may need.  Please continue to monitor your Blood Pressure on a regular basis, and call if increased > 140/90  Please return in 1 year for your yearly visit, or sooner if needed

## 2013-11-08 ENCOUNTER — Encounter: Payer: Self-pay | Admitting: Internal Medicine

## 2013-11-30 ENCOUNTER — Telehealth: Payer: Self-pay | Admitting: Internal Medicine

## 2013-11-30 NOTE — Telephone Encounter (Signed)
Relevant patient education assigned to patient using Emmi. ° °

## 2014-01-06 ENCOUNTER — Encounter: Payer: Self-pay | Admitting: Internal Medicine

## 2014-02-04 DIAGNOSIS — Z961 Presence of intraocular lens: Secondary | ICD-10-CM | POA: Diagnosis not present

## 2014-03-11 ENCOUNTER — Other Ambulatory Visit: Payer: Self-pay | Admitting: Internal Medicine

## 2014-07-09 ENCOUNTER — Other Ambulatory Visit: Payer: Self-pay

## 2014-07-09 ENCOUNTER — Encounter: Payer: Self-pay | Admitting: Internal Medicine

## 2014-07-09 MED ORDER — LEVOFLOXACIN 250 MG PO TABS: 250.0000 mg | ORAL_TABLET | Freq: Every day | ORAL | Status: DC

## 2014-07-09 NOTE — Telephone Encounter (Signed)
antibx done to [facility]

## 2014-07-09 NOTE — Telephone Encounter (Signed)
Called the patient informed resent prescription to the [facility] [address] [place].

## 2014-07-26 DIAGNOSIS — Z23 Encounter for immunization: Secondary | ICD-10-CM | POA: Diagnosis not present

## 2014-08-18 ENCOUNTER — Other Ambulatory Visit: Payer: Self-pay | Admitting: Internal Medicine

## 2014-09-03 ENCOUNTER — Other Ambulatory Visit: Payer: Self-pay | Admitting: Internal Medicine

## 2014-09-15 ENCOUNTER — Other Ambulatory Visit (INDEPENDENT_AMBULATORY_CARE_PROVIDER_SITE_OTHER): Payer: Medicare Other

## 2014-09-15 DIAGNOSIS — Z0000 Encounter for general adult medical examination without abnormal findings: Secondary | ICD-10-CM | POA: Diagnosis not present

## 2014-09-15 LAB — CBC WITH DIFFERENTIAL/PLATELET
BASOS PCT: 0.8 % (ref 0.0–3.0)
Basophils Absolute: 0 10*3/uL (ref 0.0–0.1)
Eosinophils Absolute: 0.2 10*3/uL (ref 0.0–0.7)
Eosinophils Relative: 3 % (ref 0.0–5.0)
HEMATOCRIT: 42.9 % (ref 39.0–52.0)
Hemoglobin: 14.3 g/dL (ref 13.0–17.0)
LYMPHS ABS: 1.7 10*3/uL (ref 0.7–4.0)
Lymphocytes Relative: 30.4 % (ref 12.0–46.0)
MCHC: 33.3 g/dL (ref 30.0–36.0)
MCV: 88.8 fl (ref 78.0–100.0)
MONO ABS: 0.4 10*3/uL (ref 0.1–1.0)
Monocytes Relative: 6.5 % (ref 3.0–12.0)
NEUTROS PCT: 59.3 % (ref 43.0–77.0)
Neutro Abs: 3.4 10*3/uL (ref 1.4–7.7)
Platelets: 238 10*3/uL (ref 150.0–400.0)
RBC: 4.83 Mil/uL (ref 4.22–5.81)
RDW: 12.6 % (ref 11.5–15.5)
WBC: 5.7 10*3/uL (ref 4.0–10.5)

## 2014-09-15 LAB — HEPATIC FUNCTION PANEL
ALT: 30 U/L (ref 0–53)
AST: 24 U/L (ref 0–37)
Albumin: 3.5 g/dL (ref 3.5–5.2)
Alkaline Phosphatase: 42 U/L (ref 39–117)
Bilirubin, Direct: 0.1 mg/dL (ref 0.0–0.3)
TOTAL PROTEIN: 7 g/dL (ref 6.0–8.3)
Total Bilirubin: 0.7 mg/dL (ref 0.2–1.2)

## 2014-09-15 LAB — LIPID PANEL
CHOLESTEROL: 111 mg/dL (ref 0–200)
HDL: 41.8 mg/dL (ref >39.00)
LDL CALC: 62 mg/dL (ref 0–99)
NonHDL: 69.2
TRIGLYCERIDES: 34 mg/dL (ref 0.0–149.0)
Total CHOL/HDL Ratio: 3
VLDL: 6.8 mg/dL (ref 0.0–40.0)

## 2014-09-15 LAB — BASIC METABOLIC PANEL
BUN: 23 mg/dL (ref 6–23)
CHLORIDE: 104 meq/L (ref 96–112)
CO2: 25 mEq/L (ref 19–32)
Calcium: 9.4 mg/dL (ref 8.4–10.5)
Creatinine, Ser: 1.1 mg/dL (ref 0.4–1.5)
GFR: 68.32 mL/min (ref >60.00)
Glucose, Bld: 106 mg/dL — ABNORMAL HIGH (ref 70–99)
POTASSIUM: 4.5 meq/L (ref 3.5–5.1)
SODIUM: 140 meq/L (ref 135–145)

## 2014-09-15 LAB — URINALYSIS, ROUTINE W REFLEX MICROSCOPIC
Bilirubin Urine: NEGATIVE
Hgb urine dipstick: NEGATIVE
Ketones, ur: NEGATIVE
Leukocytes, UA: NEGATIVE
NITRITE: NEGATIVE
RBC / HPF: NONE SEEN (ref 0–?)
Specific Gravity, Urine: >=1.03 — AB (ref 1.000–1.030)
Total Protein, Urine: NEGATIVE
URINE GLUCOSE: NEGATIVE
UROBILINOGEN UA: 0.2 (ref 0.0–1.0)
WBC UA: NONE SEEN (ref 0–?)
pH: 6 (ref 5.0–8.0)

## 2014-09-15 LAB — TSH: TSH: 1.04 u[IU]/mL (ref 0.35–4.50)

## 2014-09-15 LAB — PSA: PSA: 1.79 ng/mL (ref 0.10–4.00)

## 2014-09-22 ENCOUNTER — Ambulatory Visit (INDEPENDENT_AMBULATORY_CARE_PROVIDER_SITE_OTHER): Payer: Medicare Other | Admitting: Internal Medicine

## 2014-09-22 ENCOUNTER — Encounter: Payer: Self-pay | Admitting: Internal Medicine

## 2014-09-22 VITALS — BP 118/78 | HR 64 | Temp 97.8°F | Ht 74.0 in | Wt 203.1 lb

## 2014-09-22 DIAGNOSIS — J069 Acute upper respiratory infection, unspecified: Secondary | ICD-10-CM | POA: Diagnosis not present

## 2014-09-22 DIAGNOSIS — M19049 Primary osteoarthritis, unspecified hand: Secondary | ICD-10-CM | POA: Insufficient documentation

## 2014-09-22 DIAGNOSIS — E785 Hyperlipidemia, unspecified: Secondary | ICD-10-CM | POA: Diagnosis not present

## 2014-09-22 DIAGNOSIS — Z23 Encounter for immunization: Secondary | ICD-10-CM

## 2014-09-22 DIAGNOSIS — Z0000 Encounter for general adult medical examination without abnormal findings: Secondary | ICD-10-CM

## 2014-09-22 DIAGNOSIS — H9193 Unspecified hearing loss, bilateral: Secondary | ICD-10-CM | POA: Insufficient documentation

## 2014-09-22 DIAGNOSIS — G4700 Insomnia, unspecified: Secondary | ICD-10-CM

## 2014-09-22 DIAGNOSIS — I10 Essential (primary) hypertension: Secondary | ICD-10-CM

## 2014-09-22 MED ORDER — AZITHROMYCIN 250 MG PO TABS: ORAL_TABLET | ORAL | Status: DC

## 2014-09-22 MED ORDER — DICLOFENAC SODIUM 1 % TD GEL: 4.0000 g | Freq: Four times a day (QID) | TRANSDERMAL | Status: DC

## 2014-09-22 MED ORDER — ZOLPIDEM TARTRATE 5 MG PO TABS: ORAL_TABLET | ORAL | Status: DC

## 2014-09-22 NOTE — Assessment & Plan Note (Signed)
For ENT referral,  to f/u any worsening symptoms or concerns 

## 2014-09-22 NOTE — Progress Notes (Signed)
Pre visit review using our clinic review tool, if applicable. No additional management support is needed unless otherwise documented below in the visit note. 

## 2014-09-22 NOTE — Assessment & Plan Note (Addendum)
Mild to mod, for antibx course,  to f/u any worsening symptoms or concerns  Note:  Total time for pt hx, exam, review of record with pt in the room, determination of diagnoses and plan for further eval and tx is > 40 min, with over 50% spent in coordination and counseling of patient 

## 2014-09-22 NOTE — Addendum Note (Signed)
Addended by: Sharon Seller B on: 09/22/2014 09:34 AM   Modules accepted: Orders

## 2014-09-22 NOTE — Assessment & Plan Note (Signed)
stable overall by history and exam, recent data reviewed with pt, and pt to continue medical treatment as before,  to f/u any worsening symptoms or concerns Lab Results  Component Value Date   LDLCALC 62 09/15/2014

## 2014-09-22 NOTE — Assessment & Plan Note (Signed)
stable overall by history and exam, recent data reviewed with pt, and pt to continue medical treatment as before,  to f/u any worsening symptoms or concerns Lab Results  Component Value Date   WBC 5.7 09/15/2014   HGB 14.3 09/15/2014   HCT 42.9 09/15/2014   PLT 238.0 09/15/2014   GLUCOSE 106* 09/15/2014   CHOL 111 09/15/2014   TRIG 34.0 09/15/2014   HDL 41.80 09/15/2014   LDLCALC 62 09/15/2014   ALT 30 09/15/2014   AST 24 09/15/2014   NA 140 09/15/2014   K 4.5 09/15/2014   CL 104 09/15/2014   CREATININE 1.1 09/15/2014   BUN 23 09/15/2014   CO2 25 09/15/2014   TSH 1.04 09/15/2014   PSA 1.79 09/15/2014

## 2014-09-22 NOTE — Assessment & Plan Note (Signed)
stable overall by history and exam, recent data reviewed with pt, and pt to continue medical treatment as before,  to f/u any worsening symptoms or concerns BP Readings from Last 3 Encounters:  09/22/14 118/78  10/27/13 112/82  10/07/13 110/82

## 2014-09-22 NOTE — Assessment & Plan Note (Signed)
Ok for low dose ambien prn,  to f/u any worsening symptoms or concerns 

## 2014-09-22 NOTE — Patient Instructions (Addendum)
You had the pneumovax pneumonia shot today  Please take all new medication as prescribed  - the antibiotic, the voltaren gel, and the ambien as needed  You will be contacted regarding the referral for: ENT for hearing loss  Please continue all other medications as before, and refills have been done if requested.  Please have the pharmacy call with any other refills you may need.  Please continue your efforts at being more active, low cholesterol diet, and weight control.  You are otherwise up to date with prevention measures today.  Please keep your appointments with your specialists as you may have planned  Please return in 1 year for your yearly visit, or sooner if needed

## 2014-09-22 NOTE — Progress Notes (Signed)
Subjective:    Patient ID: Oscar Dixon, male    DOB: 1946-10-11, 68 y.o.   MRN: [id number]  HPI  Here for yearly f/u;  Overall doing ok;  Pt denies CP, worsening SOB, DOE, wheezing, orthopnea, PND, worsening LE edema, palpitations, dizziness or syncope.  Pt denies neurological change such as new headache, facial or extremity weakness.  Pt denies polydipsia, polyuria, or low sugar symptoms. Pt states overall good compliance with treatment and medications, good tolerability, and has been trying to follow lower cholesterol diet.  Pt denies worsening depressive symptoms, suicidal ideation or panic. No fever, night sweats, wt loss, loss of appetite, or other constitutional symptoms.  Pt states good ability with ADL's, has low fall risk, home safety reviewed and adequate, no other significant changes in hearing or vision, and only occasionally active with exercise.  Does have arthritic pain to hands primairly first mcp's with golf depiste alleve, just more painful and longer. Incidentally with acute onset sinus symtpoms -  Here with 2-3 days acute onset fever, facial pain, pressure, headache, general weakness and malaise, and greenish d/c, with mild ST and cough, Notes Brother with prostate ca recent, currently doing watchful waiting. Also some recent worsening high freq hearing loss, asks for ENT referral, hearing aids. Also some worsening sleeping difficulty such that he keeps waking up at 1 am. Past Medical History  Diagnosis Date  . 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  . CARPAL TUNNEL SYNDROME, BILATERAL 12/15/2009  . DYSPNEA ON EXERTION 12/15/2009  . FATIGUE 03/27/2010  . GERD 05/20/2007  . GOUT 03/27/2010  . HAND PAIN 12/15/2009  . Headache(784.0) 08/25/2009  . HERNIA, UMBILICAL 2/87/8676  . HYPERLIPIDEMIA 07/09/2008  . HYPERTENSION 05/20/2007  . LIBIDO, DECREASED 03/27/2010  . 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 Mitral regurgitation 09/12/2011  . Erectile dysfunction 04/12/2012   Past Surgical History    Procedure Laterality Date  . S/p basal cell cancer    . S/p right elbow surugury    . S/p ankle surgury      reports that he has quit smoking. He does not have any smokeless tobacco history on file. He reports that he drinks alcohol. He reports that he does not use illicit drugs. family history includes Alcohol abuse in his mother; Cancer in his brother; Hypertension in his father and mother. Allergies  Allergen Reactions  . Augmentin [Amoxicillin-Pot Clavulanate]     diarrhea   Current Outpatient Prescriptions on File Prior to Visit  Medication Sig Dispense Refill  . allopurinol (ZYLOPRIM) 100 MG tablet TAKE 1 TABLET DAILY 90 tablet 3  . amLODipine (NORVASC) 5 MG tablet Take 1 tablet (5 mg total) by mouth daily. 30 tablet 1  . amLODipine (NORVASC) 5 MG tablet TAKE 1 TABLET DAILY 90 tablet 1  . aspirin 81 MG EC tablet Take 81 mg by mouth daily.      . benazepril (LOTENSIN) 20 MG tablet TAKE 1 TABLET DAILY 90 tablet 1  . CRESTOR 20 MG tablet TAKE 1 TABLET DAILY 90 tablet 2  . esomeprazole (NEXIUM) 40 MG capsule TAKE 1 CAPSULE DAILY 90 capsule 2  . fluticasone (FLONASE) 50 MCG/ACT nasal spray Place 2 sprays into both nostrils daily. 16 g 2  . VIAGRA 100 MG tablet take 1 tablet by mouth once daily if needed for ERECTILE DYSFUNCTION 5 tablet 11   No current facility-administered medications on file prior to visit.    Review of Systems Constitutional: Negative for increased diaphoresis, other activity, appetite or other  siginficant weight change  HENT: Negative for worsening hearing loss, ear pain, facial swelling, mouth sores and neck stiffness.   Eyes: Negative for other worsening pain, redness or visual disturbance.  Respiratory: Negative for shortness of breath and wheezing.   Cardiovascular: Negative for chest pain and palpitations.  Gastrointestinal: Negative for diarrhea, blood in stool, abdominal distention or other pain Genitourinary: Negative for hematuria, flank pain or  change in urine volume.  Musculoskeletal: Negative for myalgias or other joint complaints.  Skin: Negative for color change and wound.  Neurological: Negative for syncope and numbness. other than noted Hematological: Negative for adenopathy. or other swelling Psychiatric/Behavioral: Negative for hallucinations, self-injury, decreased concentration or other worsening agitation.      Objective:   Physical Exam BP 118/78 mmHg  Pulse 64  Temp(Src) 97.8 F (36.6 C) (Oral)  Ht 6\' 2"  (1.88 m)  Wt 203 lb 2 oz (92.137 kg)  BMI 26.07 kg/m2  SpO2 97% VS noted,  Constitutional: Pt is oriented to person, place, and time. Appears well-developed and well-nourished.  Head: Normocephalic and atraumatic.  Right Ear: External ear normal.  Left Ear: External ear normal.  Nose: Nose normal.  Bilat tm's with mild erythema.  Max sinus areas mild tender.  Pharynx with mild erythema, no exudate Mouth/Throat: Oropharynx is clear and moist.  Eyes: Conjunctivae and EOM are normal. Pupils are equal, round, and reactive to light.  Neck: Normal range of motion. Neck supple. No JVD present. No tracheal deviation present.  Cardiovascular: Normal rate, regular rhythm, normal heart sounds and intact distal pulses.   Pulmonary/Chest: Effort normal and breath sounds without rales or wheezing  Abdominal: Soft. Bowel sounds are normal. NT. No HSM  Musculoskeletal: Normal range of motion. Exhibits no edema.  Lymphadenopathy:  Has no cervical adenopathy.  Neurological: Pt is alert and oriented to person, place, and time. Pt has normal reflexes. No cranial nerve deficit. Motor grossly intact + arthritic changes to hands Skin: Skin is warm and dry. No rash noted.  Psychiatric:  Has normal mood and affect. Behavior is normal.     Assessment & Plan:

## 2014-09-27 ENCOUNTER — Encounter: Payer: Self-pay | Admitting: Internal Medicine

## 2014-11-05 HISTORY — PX: POLYPECTOMY: SHX149

## 2014-11-05 HISTORY — PX: COLONOSCOPY: SHX174

## 2014-11-24 DIAGNOSIS — C44519 Basal cell carcinoma of skin of other part of trunk: Secondary | ICD-10-CM | POA: Diagnosis not present

## 2014-11-24 DIAGNOSIS — D485 Neoplasm of uncertain behavior of skin: Secondary | ICD-10-CM | POA: Diagnosis not present

## 2014-11-24 DIAGNOSIS — Z85828 Personal history of other malignant neoplasm of skin: Secondary | ICD-10-CM | POA: Diagnosis not present

## 2014-11-24 DIAGNOSIS — L570 Actinic keratosis: Secondary | ICD-10-CM | POA: Diagnosis not present

## 2015-02-03 ENCOUNTER — Encounter: Payer: Self-pay | Admitting: Gastroenterology

## 2015-02-03 ENCOUNTER — Encounter: Payer: Self-pay | Admitting: Internal Medicine

## 2015-02-07 ENCOUNTER — Encounter: Payer: Self-pay | Admitting: Gastroenterology

## 2015-02-07 ENCOUNTER — Other Ambulatory Visit: Payer: Self-pay | Admitting: Internal Medicine

## 2015-02-09 ENCOUNTER — Other Ambulatory Visit: Payer: Self-pay | Admitting: Internal Medicine

## 2015-02-11 ENCOUNTER — Other Ambulatory Visit: Payer: Self-pay | Admitting: Internal Medicine

## 2015-02-17 DIAGNOSIS — C44519 Basal cell carcinoma of skin of other part of trunk: Secondary | ICD-10-CM | POA: Diagnosis not present

## 2015-03-16 ENCOUNTER — Other Ambulatory Visit: Payer: Self-pay | Admitting: Internal Medicine

## 2015-03-17 NOTE — Telephone Encounter (Signed)
Done hardcopy to steph 

## 2015-04-14 ENCOUNTER — Ambulatory Visit (AMBULATORY_SURGERY_CENTER): Payer: Self-pay | Admitting: *Deleted

## 2015-04-14 VITALS — Ht 74.0 in | Wt 205.2 lb

## 2015-04-14 DIAGNOSIS — Z1211 Encounter for screening for malignant neoplasm of colon: Secondary | ICD-10-CM

## 2015-04-14 MED ORDER — NA SULFATE-K SULFATE-MG SULF 17.5-3.13-1.6 GM/177ML PO SOLN: 1.0000 | Freq: Once | ORAL | Status: DC

## 2015-04-14 NOTE — Progress Notes (Signed)
No egg or soy allergy No diet pills No home 02 use No issues with past sedation emmi video declined

## 2015-04-28 ENCOUNTER — Encounter: Payer: Self-pay | Admitting: Gastroenterology

## 2015-04-28 ENCOUNTER — Ambulatory Visit (AMBULATORY_SURGERY_CENTER): Payer: Medicare Other | Admitting: Gastroenterology

## 2015-04-28 VITALS — BP 136/79 | HR 61 | Temp 98.0°F | Resp 14 | Ht 74.0 in | Wt 205.0 lb

## 2015-04-28 DIAGNOSIS — D125 Benign neoplasm of sigmoid colon: Secondary | ICD-10-CM | POA: Diagnosis not present

## 2015-04-28 DIAGNOSIS — E669 Obesity, unspecified: Secondary | ICD-10-CM | POA: Diagnosis not present

## 2015-04-28 DIAGNOSIS — E780 Pure hypercholesterolemia: Secondary | ICD-10-CM | POA: Diagnosis not present

## 2015-04-28 DIAGNOSIS — M109 Gout, unspecified: Secondary | ICD-10-CM | POA: Diagnosis not present

## 2015-04-28 DIAGNOSIS — D123 Benign neoplasm of transverse colon: Secondary | ICD-10-CM | POA: Diagnosis not present

## 2015-04-28 DIAGNOSIS — I10 Essential (primary) hypertension: Secondary | ICD-10-CM | POA: Diagnosis not present

## 2015-04-28 DIAGNOSIS — Z1211 Encounter for screening for malignant neoplasm of colon: Secondary | ICD-10-CM | POA: Diagnosis not present

## 2015-04-28 MED ORDER — SODIUM CHLORIDE 0.9 % IV SOLN: 500.0000 mL | INTRAVENOUS | Status: DC

## 2015-04-28 NOTE — Progress Notes (Signed)
Called to room to assist during endoscopic procedure.  Patient ID and intended procedure confirmed with present staff. Received instructions for my participation in the procedure from the performing physician.  

## 2015-04-28 NOTE — Op Note (Signed)
[place]  [address]. [place], [address]   COLONOSCOPY PROCEDURE REPORT  PATIENT: Oscar Dixon, Oscar Dixon  MR#: [id number] BIRTHDATE: 21-May-1946 , 68  yrs. old GENDER: male ENDOSCOPIST: Inda Castle, MD REFERRED VE:LFYBO John, M.D. PROCEDURE DATE:  04/28/2015 PROCEDURE:   Colonoscopy, screening and Colonoscopy with snare polypectomy First Screening Colonoscopy - Avg.  risk and is 50 yrs.  old or older - No.  Prior Negative Screening - Now for repeat screening. 10 or more years since last screening  History of Adenoma - Now for follow-up colonoscopy & has been > or = to 3 yrs.  N/A  Polyps removed today? Yes ASA CLASS:   Class II INDICATIONS:Colorectal Neoplasm Risk Assessment for this procedure is average risk. MEDICATIONS: Monitored anesthesia care and Propofol 230  DESCRIPTION OF PROCEDURE:   After the risks benefits and alternatives of the procedure were thoroughly explained, informed consent was obtained.  The digital rectal exam revealed no abnormalities of the rectum.   The [id number]  endoscope was introduced through the anus and advanced to the cecum, which was identified by both the appendix and ileocecal valve. No adverse events experienced.   The quality of the prep was (Suprep was used) excellent.  The instrument was then slowly withdrawn as the colon was fully examined. Estimated blood loss is zero unless otherwise noted in this procedure report.      COLON FINDINGS: A sessile polyp measuring 4 mm in size was found in the proximal transverse colon.  A polypectomy was performed with a cold snare.  The resection was complete, the polyp tissue was completely retrieved and sent to histology.   A sessile polyp measuring 8 mm in size was found in the sigmoid colon.  A polypectomy was performed with a cold snare.  The resection was complete, the polyp tissue was completely retrieved and sent to histology.  Retroflexed views revealed no  abnormalities. The time to cecum = 7.1 Withdrawal time = 6.1   The scope was withdrawn and the procedure completed. COMPLICATIONS: There were no immediate complications.  ENDOSCOPIC IMPRESSION: 1.   Sessile polyp was found in the proximal transverse colon; polypectomy was performed with a cold snare 2.   Sessile polyp was found in the sigmoid colon; polypectomy was performed with a cold snare  RECOMMENDATIONS: If the polyp(s) removed today are proven to be adenomatous (pre-cancerous) polyps, you will need a repeat colonoscopy in 5 years.  Otherwise you should continue to follow colorectal cancer screening guidelines for "routine risk" patients with colonoscopy in 10 years.  You will receive a letter within 1-2 weeks with the results of your biopsy as well as final recommendations.  Please call my office if you have not received a letter after 3 weeks.  eSigned:  Inda Castle, MD 04/28/2015 9:36 AM   cc:   PATIENT NAME:  Oscar Dixon, Oscar Dixon MR#: [id number]

## 2015-04-28 NOTE — Progress Notes (Signed)
Report to PACU, RN, vss, BBS= Clear.  

## 2015-04-28 NOTE — Patient Instructions (Signed)
YOU HAD AN ENDOSCOPIC PROCEDURE TODAY AT THE [facility]:   Refer to the procedure report that was given to you for any specific questions about what was found during the examination.  If the procedure report does not answer your questions, please call your gastroenterologist to clarify.  If you requested that your care partner not be given the details of your procedure findings, then the procedure report has been included in a sealed envelope for you to review at your convenience later.  YOU SHOULD EXPECT: Some feelings of bloating in the abdomen. Passage of more gas than usual.  Walking can help get rid of the air that was put into your GI tract during the procedure and reduce the bloating. If you had a lower endoscopy (such as a colonoscopy or flexible sigmoidoscopy) you may notice spotting of blood in your stool or on the toilet paper. If you underwent a bowel prep for your procedure, you may not have a normal bowel movement for a few days.  Please Note:  You might notice some irritation and congestion in your nose or some drainage.  This is from the oxygen used during your procedure.  There is no need for concern and it should clear up in a day or so.  SYMPTOMS TO REPORT IMMEDIATELY:   Following lower endoscopy (colonoscopy or flexible sigmoidoscopy):  Excessive amounts of blood in the stool  Significant tenderness or worsening of abdominal pains  Swelling of the abdomen that is new, acute  Fever of 100F or higher   For urgent or emergent issues, a gastroenterologist can be reached at any hour by calling [phone].   DIET: Your first meal following the procedure should be a small meal and then it is ok to progress to your normal diet. Heavy or fried foods are harder to digest and may make you feel nauseous or bloated.  Likewise, meals heavy in dairy and vegetables can increase bloating.  Drink plenty of fluids but you should avoid alcoholic beverages for 24  hours.  ACTIVITY:  You should plan to take it easy for the rest of today and you should NOT DRIVE or use heavy machinery until tomorrow (because of the sedation medicines used during the test).    FOLLOW UP: Our staff will call the number listed on your records the next business day following your procedure to check on you and address any questions or concerns that you may have regarding the information given to you following your procedure. If we do not reach you, we will leave a message.  However, if you are feeling well and you are not experiencing any problems, there is no need to return our call.  We will assume that you have returned to your regular daily activities without incident.  If any biopsies were taken you will be contacted by phone or by letter within the next 1-3 weeks.  Please call us at [phone] if you have not heard about the biopsies in 3 weeks.    SIGNATURES/CONFIDENTIALITY: You and/or your care partner have signed paperwork which will be entered into your electronic medical record.  These signatures attest to the fact that that the information above on your After Visit Summary has been reviewed and is understood.  Full responsibility of the confidentiality of this discharge information lies with you and/or your care-partner. 

## 2015-04-29 ENCOUNTER — Telehealth: Payer: Self-pay | Admitting: *Deleted

## 2015-04-29 NOTE — Telephone Encounter (Signed)
  Follow up Call-  Call back number 04/28/2015  Post procedure Call Back phone  # [phone]  Permission to leave phone message Yes     Patient questions:  Do you have a fever, pain , or abdominal swelling? No. Pain Score  0 *  Have you tolerated food without any problems? Yes.    Have you been able to return to your normal activities? Yes.    Do you have any questions about your discharge instructions: Diet   No. Medications  No. Follow up visit  No.  Do you have questions or concerns about your Care? No.  Actions: * If pain score is 4 or above: No action needed, pain <4.

## 2015-05-05 ENCOUNTER — Encounter: Payer: Self-pay | Admitting: Gastroenterology

## 2015-05-09 ENCOUNTER — Other Ambulatory Visit: Payer: Self-pay | Admitting: Internal Medicine

## 2015-07-19 ENCOUNTER — Encounter: Payer: Self-pay | Admitting: Internal Medicine

## 2015-07-19 DIAGNOSIS — Z23 Encounter for immunization: Secondary | ICD-10-CM | POA: Diagnosis not present

## 2015-07-28 ENCOUNTER — Encounter: Payer: Self-pay | Admitting: Internal Medicine

## 2015-07-28 MED ORDER — COLCHICINE 0.6 MG PO TABS: 0.6000 mg | ORAL_TABLET | Freq: Every day | ORAL | Status: DC

## 2015-07-28 NOTE — Telephone Encounter (Signed)
[facility]

## 2015-07-29 ENCOUNTER — Other Ambulatory Visit: Payer: Self-pay | Admitting: Internal Medicine

## 2015-07-29 MED ORDER — COLCHICINE 0.6 MG PO TABS: 0.6000 mg | ORAL_TABLET | Freq: Every day | ORAL | Status: DC

## 2015-07-29 MED ORDER — ALLOPURINOL 100 MG PO TABS: 100.0000 mg | ORAL_TABLET | Freq: Every day | ORAL | Status: DC

## 2015-07-29 NOTE — Telephone Encounter (Signed)
Pt has called back stating [facility] won't fill it because it was sent somewhere else. He is still frustrated. Please call him ASAP  [phone]

## 2015-07-29 NOTE — Telephone Encounter (Signed)
Pt called in and said that the wrong med was sent in to [facility].  She said that dr know was suppose to be sending in a new one and not the one that he has always taken.  Can you call him as soon has you can.  He is a little frustrated.  Thank you !!!     Best number [phone]

## 2015-07-29 NOTE — Telephone Encounter (Signed)
Pt informed [facility] was called and [facility] was cancelled

## 2015-07-29 NOTE — Telephone Encounter (Signed)
[facility] and rx has been [facility]. [facility] contacted and requested them to sign it. They stated that they were showing it is still says early fill.   Pt informed.

## 2015-08-04 ENCOUNTER — Other Ambulatory Visit: Payer: Self-pay | Admitting: Internal Medicine

## 2015-08-14 ENCOUNTER — Other Ambulatory Visit: Payer: Self-pay | Admitting: Internal Medicine

## 2015-09-02 ENCOUNTER — Other Ambulatory Visit: Payer: Self-pay | Admitting: Internal Medicine

## 2015-09-10 ENCOUNTER — Encounter: Payer: Self-pay | Admitting: Internal Medicine

## 2015-09-19 ENCOUNTER — Other Ambulatory Visit: Payer: Self-pay | Admitting: Internal Medicine

## 2015-09-19 ENCOUNTER — Other Ambulatory Visit: Payer: Self-pay

## 2015-09-19 MED ORDER — ZOLPIDEM TARTRATE 5 MG PO TABS: 5.0000 mg | ORAL_TABLET | Freq: Every day | ORAL | Status: DC

## 2015-09-20 ENCOUNTER — Other Ambulatory Visit: Payer: Self-pay | Admitting: Internal Medicine

## 2015-09-21 ENCOUNTER — Other Ambulatory Visit: Payer: Medicare Other

## 2015-09-23 ENCOUNTER — Telehealth: Payer: Self-pay | Admitting: Internal Medicine

## 2015-09-23 DIAGNOSIS — Z0000 Encounter for general adult medical examination without abnormal findings: Secondary | ICD-10-CM

## 2015-09-23 NOTE — Telephone Encounter (Signed)
Pt is requesting to have his lab work done before his physical on Tuesday. Please advise

## 2015-09-23 NOTE — Telephone Encounter (Signed)
Please advise pt that orders were placed

## 2015-09-23 NOTE — Telephone Encounter (Signed)
I have reviewed his insurance,  I believe he has traditional medicare as his primary insurance  Medicare does not pay for labs prior to visit, and [employer] is not likely then either, if medicare does not pay some part  He can have the labs done, but be aware he may be responsible for the bill which be over $300

## 2015-09-23 NOTE — Telephone Encounter (Signed)
Pt informed

## 2015-09-27 ENCOUNTER — Ambulatory Visit (INDEPENDENT_AMBULATORY_CARE_PROVIDER_SITE_OTHER): Payer: Medicare Other | Admitting: Internal Medicine

## 2015-09-27 ENCOUNTER — Other Ambulatory Visit (INDEPENDENT_AMBULATORY_CARE_PROVIDER_SITE_OTHER): Payer: Medicare Other

## 2015-09-27 ENCOUNTER — Encounter: Payer: Self-pay | Admitting: Internal Medicine

## 2015-09-27 VITALS — BP 136/84 | HR 75 | Temp 97.7°F | Ht 74.0 in | Wt 206.0 lb

## 2015-09-27 DIAGNOSIS — I10 Essential (primary) hypertension: Secondary | ICD-10-CM

## 2015-09-27 DIAGNOSIS — N320 Bladder-neck obstruction: Secondary | ICD-10-CM

## 2015-09-27 DIAGNOSIS — G4700 Insomnia, unspecified: Secondary | ICD-10-CM

## 2015-09-27 DIAGNOSIS — J069 Acute upper respiratory infection, unspecified: Secondary | ICD-10-CM

## 2015-09-27 DIAGNOSIS — Z20828 Contact with and (suspected) exposure to other viral communicable diseases: Secondary | ICD-10-CM | POA: Diagnosis not present

## 2015-09-27 DIAGNOSIS — E785 Hyperlipidemia, unspecified: Secondary | ICD-10-CM

## 2015-09-27 LAB — LIPID PANEL
CHOLESTEROL: 128 mg/dL (ref 0–200)
HDL: 51.6 mg/dL (ref >39.00)
LDL CALC: 61 mg/dL (ref 0–99)
NONHDL: 76.52
Total CHOL/HDL Ratio: 2
Triglycerides: 76 mg/dL (ref 0.0–149.0)
VLDL: 15.2 mg/dL (ref 0.0–40.0)

## 2015-09-27 LAB — BASIC METABOLIC PANEL
BUN: 17 mg/dL (ref 6–23)
CALCIUM: 9.6 mg/dL (ref 8.4–10.5)
CO2: 30 meq/L (ref 19–32)
CREATININE: 1.22 mg/dL (ref 0.40–1.50)
Chloride: 104 mEq/L (ref 96–112)
GFR: 62.98 mL/min (ref >60.00)
GLUCOSE: 110 mg/dL — AB (ref 70–99)
Potassium: 4.5 mEq/L (ref 3.5–5.1)
SODIUM: 141 meq/L (ref 135–145)

## 2015-09-27 LAB — URINALYSIS, ROUTINE W REFLEX MICROSCOPIC
BILIRUBIN URINE: NEGATIVE
HGB URINE DIPSTICK: NEGATIVE
Ketones, ur: NEGATIVE
LEUKOCYTES UA: NEGATIVE
NITRITE: NEGATIVE
RBC / HPF: NONE SEEN (ref 0–?)
Specific Gravity, Urine: 1.01 (ref 1.000–1.030)
TOTAL PROTEIN, URINE-UPE24: NEGATIVE
URINE GLUCOSE: NEGATIVE
UROBILINOGEN UA: 0.2 (ref 0.0–1.0)
WBC, UA: NONE SEEN (ref 0–?)
pH: 6.5 (ref 5.0–8.0)

## 2015-09-27 LAB — CBC WITH DIFFERENTIAL/PLATELET
BASOS PCT: 0.6 % (ref 0.0–3.0)
Basophils Absolute: 0 10*3/uL (ref 0.0–0.1)
EOS PCT: 2.4 % (ref 0.0–5.0)
Eosinophils Absolute: 0.2 10*3/uL (ref 0.0–0.7)
HEMATOCRIT: 44.1 % (ref 39.0–52.0)
HEMOGLOBIN: 14.7 g/dL (ref 13.0–17.0)
LYMPHS PCT: 21.7 % (ref 12.0–46.0)
Lymphs Abs: 1.8 10*3/uL (ref 0.7–4.0)
MCHC: 33.4 g/dL (ref 30.0–36.0)
MCV: 87.9 fl (ref 78.0–100.0)
MONO ABS: 0.5 10*3/uL (ref 0.1–1.0)
MONOS PCT: 6.3 % (ref 3.0–12.0)
Neutro Abs: 5.7 10*3/uL (ref 1.4–7.7)
Neutrophils Relative %: 69 % (ref 43.0–77.0)
Platelets: 207 10*3/uL (ref 150.0–400.0)
RBC: 5.01 Mil/uL (ref 4.22–5.81)
RDW: 13 % (ref 11.5–15.5)
WBC: 8.2 10*3/uL (ref 4.0–10.5)

## 2015-09-27 LAB — HEPATIC FUNCTION PANEL
ALBUMIN: 4.4 g/dL (ref 3.5–5.2)
ALK PHOS: 49 U/L (ref 39–117)
ALT: 22 U/L (ref 0–53)
AST: 23 U/L (ref 0–37)
Bilirubin, Direct: 0.1 mg/dL (ref 0.0–0.3)
TOTAL PROTEIN: 7.3 g/dL (ref 6.0–8.3)
Total Bilirubin: 0.6 mg/dL (ref 0.2–1.2)

## 2015-09-27 LAB — PSA: PSA: 1.56 ng/mL (ref 0.10–4.00)

## 2015-09-27 LAB — TSH: TSH: 1.06 u[IU]/mL (ref 0.35–4.50)

## 2015-09-27 MED ORDER — ZOLPIDEM TARTRATE 5 MG PO TABS: 5.0000 mg | ORAL_TABLET | Freq: Every day | ORAL | Status: DC

## 2015-09-27 NOTE — Progress Notes (Signed)
Subjective:    Patient ID: Oscar Dixon, male    DOB: 12/11/1945, 69 y.o.   MRN: [id number]  HPI  Here for wellness and f/u;  Overall doing ok;  Pt denies Chest pain, worsening SOB, DOE, wheezing, orthopnea, PND, worsening LE edema, palpitations, dizziness or syncope.  Pt denies neurological change such as new headache, facial or extremity weakness.  Pt denies polydipsia, polyuria, or low sugar symptoms. Pt states overall good compliance with treatment and medications, good tolerability, and has been trying to follow appropriate diet.  Pt denies worsening depressive symptoms, suicidal ideation or panic. No fever, night sweats, wt loss, loss of appetite, or other constitutional symptoms.  Pt states good ability with ADL's, has low fall risk, home safety reviewed and adequate, no other significant changes in hearing or vision, and only occasionally active with exercise. Just had 6th grandchild now 32 wk old admitted last [facility] to [facility] hosp with fever, he himself with mild URI symptoms x 3 days, now improving Past Medical History  Diagnosis Date  . 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  . CARPAL TUNNEL SYNDROME, BILATERAL 12/15/2009  . DYSPNEA ON EXERTION 12/15/2009  . FATIGUE 03/27/2010  . GERD 05/20/2007  . GOUT 03/27/2010  . HAND PAIN 12/15/2009  . Headache(784.0) 08/25/2009  . HERNIA, UMBILICAL [id number]  . HYPERLIPIDEMIA 07/09/2008  . HYPERTENSION 05/20/2007  . LIBIDO, DECREASED 03/27/2010  . 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 Mitral regurgitation 09/12/2011  . Erectile dysfunction 04/12/2012  . Arthritis   . Cataract    Past Surgical History  Procedure Laterality Date  . S/p basal cell cancer    . S/p right elbow surugury    . S/p ankle surgury    . Cataract extraction      bilateral  . Colonoscopy  04-24-2005    reports that he has quit smoking. He has never used smokeless tobacco. He reports that he drinks alcohol. He reports that he does not use illicit drugs. family history includes Alcohol  abuse in his mother; Cancer in his brother; Hypertension in his father and mother. Allergies  Allergen Reactions  . Augmentin [Amoxicillin-Pot Clavulanate] Diarrhea    diarrhea   / Current Outpatient Prescriptions on File Prior to Visit  Medication Sig Dispense Refill  . allopurinol (ZYLOPRIM) 100 MG tablet Take 1 tablet (100 mg total) by mouth daily. 90 tablet 2  . amLODipine (NORVASC) 5 MG tablet TAKE 1 TABLET DAILY 90 tablet 2  . aspirin 81 MG EC tablet Take 81 mg by mouth daily.      . benazepril (LOTENSIN) 20 MG tablet TAKE 1 TABLET DAILY 90 tablet 0  . colchicine 0.6 MG tablet Take 1 tablet (0.6 mg total) by mouth daily. 90 tablet 3  . diclofenac sodium (VOLTAREN) 1 % GEL Apply 4 g topically 4 (four) times daily. As needed for pain 100 g 11  . esomeprazole (NEXIUM) 40 MG capsule TAKE 1 CAPSULE DAILY 90 capsule 1  . rosuvastatin (CRESTOR) 20 MG tablet TAKE 1 TABLET DAILY 90 tablet 1  . amLODipine (NORVASC) 5 MG tablet Take 1 tablet (5 mg total) by mouth daily. 30 tablet 1   No current facility-administered medications on file prior to visit.    Review of Systems Constitutional: Negative for increased diaphoresis, other activity, appetite or siginficant weight change other than noted HENT: Negative for worsening hearing loss, ear pain, facial swelling, mouth sores and neck stiffness.   Eyes: Negative for other worsening pain, redness or visual disturbance.  Respiratory: Negative for shortness of breath and wheezing  Cardiovascular: Negative for chest pain and palpitations.  Gastrointestinal: Negative for diarrhea, blood in stool, abdominal distention or other pain Genitourinary: Negative for hematuria, flank pain or change in urine volume.  Musculoskeletal: Negative for myalgias or other joint complaints.  Skin: Negative for color change and wound or drainage.  Neurological: Negative for syncope and numbness. other than noted Hematological: Negative for adenopathy. or other  swelling Psychiatric/Behavioral: Negative for hallucinations, SI, self-injury, decreased concentration or other worsening agitation.      Objective:   Physical Exam BP 136/84 mmHg  Pulse 75  Temp(Src) 97.7 F (36.5 C) (Oral)  Ht 6\' 2"  (1.88 m)  Wt 206 lb (93.441 kg)  BMI 26.44 kg/m2  SpO2 97% VS noted,  Constitutional: Pt is oriented to person, place, and time. Appears well-developed and well-nourished, in no significant distress Head: Normocephalic and atraumatic.  Right Ear: External ear normal.  Left Ear: External ear normal.  Nose: Nose normal.  Mouth/Throat: Oropharynx is clear and moist.  Bilat tm's with trace erythema.  Max sinus areas non tender.  Pharynx with mild erythema, no exudate Eyes: Conjunctivae and EOM are normal. Pupils are equal, round, and reactive to light.  Neck: Normal range of motion. Neck supple. No JVD present. No tracheal deviation present or significant neck LA or mass Cardiovascular: Normal rate, regular rhythm, normal heart sounds and intact distal pulses.   Pulmonary/Chest: Effort normal and breath sounds without rales or wheezing  Abdominal: Soft. Bowel sounds are normal. NT. No HSM  Musculoskeletal: Normal range of motion. Exhibits no edema.  Lymphadenopathy:  Has no cervical adenopathy.  Neurological: Pt is alert and oriented to person, place, and time. Pt has normal reflexes. No cranial nerve deficit. Motor grossly intact Skin: Skin is warm and dry. No rash noted.  Psychiatric:  Has normal mood and affect. Behavior is normal.     Assessment & Plan:

## 2015-09-27 NOTE — Assessment & Plan Note (Addendum)
stable overall by history and exam, recent data reviewed with pt, and pt to continue medical treatment as before,  to f/u any worsening symptoms or concerns BP Readings from Last 3 Encounters:  09/27/15 136/84  04/28/15 136/79  09/22/14 118/78   ECG reviewed as per emr

## 2015-09-27 NOTE — Assessment & Plan Note (Signed)
Ok for med refill,  to f/u any worsening symptoms or concerns  

## 2015-09-27 NOTE — Patient Instructions (Addendum)
You had the flu shot today  Please continue all other medications as before, and refills have been done if requested.  Please have the pharmacy call with any other refills you may need.  Please continue your efforts at being more active, low cholesterol diet, and weight control.  You are otherwise up to date with prevention measures today.  Please keep your appointments with your specialists as you may have planned  Please go to the LAB in the Basement (turn left off the elevator) for the tests to be done today, with the Hep C  You will be contacted by phone if any changes need to be made immediately.  Otherwise, you will receive a letter about your results with an explanation, but please check with MyChart first.  Please remember to sign up for MyChart if you have not done so, as this will be important to you in the future with finding out test results, communicating by private email, and scheduling acute appointments online when needed.  Please return in 1 year for your yearly visit, or sooner if needed

## 2015-09-27 NOTE — Progress Notes (Signed)
Pre visit review using our clinic review tool, if applicable. No additional management support is needed unless otherwise documented below in the visit note. 

## 2015-09-27 NOTE — Assessment & Plan Note (Signed)
stable overall by history and exam, recent data reviewed with pt, and pt to continue medical treatment as before,  to f/u any worsening symptoms or concerns Lab Results  Component Value Date   LDLCALC 62 09/15/2014    

## 2015-09-27 NOTE — Addendum Note (Signed)
Addended by: Biagio Borg on: 09/27/2015 12:23 PM   Modules accepted: Miquel Dunn

## 2015-09-27 NOTE — Assessment & Plan Note (Signed)
Mild, viral, for mucinex otc prn,  to f/u any worsening symptoms or concerns

## 2015-09-28 LAB — HEPATITIS C ANTIBODY: HCV AB: NEGATIVE

## 2015-10-31 ENCOUNTER — Other Ambulatory Visit: Payer: Self-pay | Admitting: Internal Medicine

## 2015-12-11 ENCOUNTER — Other Ambulatory Visit: Payer: Self-pay | Admitting: Internal Medicine

## 2015-12-29 ENCOUNTER — Encounter: Payer: Self-pay | Admitting: Internal Medicine

## 2015-12-30 ENCOUNTER — Other Ambulatory Visit: Payer: Self-pay | Admitting: Internal Medicine

## 2016-01-08 ENCOUNTER — Encounter: Payer: Self-pay | Admitting: Internal Medicine

## 2016-01-09 ENCOUNTER — Other Ambulatory Visit: Payer: Self-pay

## 2016-01-09 ENCOUNTER — Other Ambulatory Visit: Payer: Self-pay | Admitting: Internal Medicine

## 2016-01-09 MED ORDER — MUPIROCIN CALCIUM 2 % NA OINT: 1.0000 "application " | TOPICAL_OINTMENT | Freq: Two times a day (BID) | NASAL | Status: DC

## 2016-01-09 NOTE — Telephone Encounter (Signed)
Pharmacy called dr Awilda Metro side [facility] Le gave verbal for mupiron cream.../lmb

## 2016-02-02 ENCOUNTER — Other Ambulatory Visit: Payer: Self-pay | Admitting: Internal Medicine

## 2016-03-23 ENCOUNTER — Other Ambulatory Visit: Payer: Self-pay | Admitting: Internal Medicine

## 2016-03-26 DIAGNOSIS — M19042 Primary osteoarthritis, left hand: Secondary | ICD-10-CM | POA: Diagnosis not present

## 2016-03-26 DIAGNOSIS — S63641A Sprain of metacarpophalangeal joint of right thumb, initial encounter: Secondary | ICD-10-CM | POA: Diagnosis not present

## 2016-03-26 DIAGNOSIS — M19041 Primary osteoarthritis, right hand: Secondary | ICD-10-CM | POA: Diagnosis not present

## 2016-04-03 ENCOUNTER — Telehealth: Payer: Self-pay | Admitting: *Deleted

## 2016-04-03 ENCOUNTER — Encounter: Payer: Self-pay | Admitting: Internal Medicine

## 2016-04-03 MED ORDER — ZOLPIDEM TARTRATE 5 MG PO TABS: 5.0000 mg | ORAL_TABLET | Freq: Every day | ORAL | Status: DC

## 2016-04-03 NOTE — Telephone Encounter (Signed)
Done hardcopy to Corinne  

## 2016-04-03 NOTE — Telephone Encounter (Signed)
Left msg on triage requesting refill on Ambien. Need to go to [facility]...Oscar Dixon

## 2016-04-04 ENCOUNTER — Telehealth: Payer: Self-pay

## 2016-04-04 NOTE — Telephone Encounter (Signed)
Rx faxed to pharmacy...Johny Chess

## 2016-04-04 NOTE — Telephone Encounter (Signed)
PA initiated via [facility] fax [phone]

## 2016-04-04 NOTE — Telephone Encounter (Signed)
Corinne to see above 

## 2016-04-10 NOTE — Telephone Encounter (Signed)
Pt paid for this medication out of pocket on 04/10/2016 - still waiting on Insurance decision regarding PA

## 2016-04-16 ENCOUNTER — Encounter: Payer: Self-pay | Admitting: Internal Medicine

## 2016-04-18 ENCOUNTER — Other Ambulatory Visit: Payer: Self-pay | Admitting: Internal Medicine

## 2016-04-25 ENCOUNTER — Telehealth: Payer: Self-pay

## 2016-04-25 NOTE — Telephone Encounter (Signed)
PA form received from [facility] and completed.  Placed on MD's desk for signature.  (see pt e-mail 04/16/2016)

## 2016-04-25 NOTE — Telephone Encounter (Signed)
Paperwork signed and faxed back to [facility] [phone]

## 2016-04-26 MED ORDER — RAMELTEON 8 MG PO TABS: 8.0000 mg | ORAL_TABLET | Freq: Every evening | ORAL | Status: DC | PRN

## 2016-04-26 NOTE — Telephone Encounter (Signed)
Kemp for rozerem trial - done erx

## 2016-04-26 NOTE — Telephone Encounter (Signed)
PA DENIED, Zolpidem is now classed as a high-risk medication and per pt's insurance he must try and fail one of the preferred medications ; Rozerem, Silenor or Trazodone

## 2016-04-27 NOTE — Telephone Encounter (Signed)
Patient aware.

## 2016-05-16 ENCOUNTER — Encounter: Payer: Self-pay | Admitting: Internal Medicine

## 2016-05-16 NOTE — Telephone Encounter (Signed)
Corrine to contact pt - to help make appt with Dr Tamala Julian plesae for probable nerve pain

## 2016-06-01 ENCOUNTER — Other Ambulatory Visit: Payer: Self-pay | Admitting: Internal Medicine

## 2016-06-07 NOTE — Progress Notes (Signed)
[facility] [place], [place] [address] Phone: [phone] Subjective:    I'm seeing this patient by the request  of:  Cathlean Cower, MD   CC: back of arm pain   [id number]  Oscar Dixon is a 70 y.o. male coming in with complaint of upper back pain with radiation down arm. Patient states most the pain is localized around the scapula. Patient describes the pain as a dull, throbbing aching sensation that can be constant. Patient has had for multiple months and does not remember any true injury. Patient still able to do most daily activities but sometimes is severe enough and extension not able to pick up his grandchildren. Patient states that he can feeling in his arm but denies any significant shoulder pain. Denies any associated neck pain. Denies any fever, chills, any abnormal weight loss.      Past Medical History:  Diagnosis Date  . 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  . Arthritis   . CARPAL TUNNEL SYNDROME, BILATERAL 12/15/2009  . Cataract   . DYSPNEA ON EXERTION 12/15/2009  . Erectile dysfunction 04/12/2012  . FATIGUE 03/27/2010  . GERD 05/20/2007  . GOUT 03/27/2010  . HAND PAIN 12/15/2009  . Headache(784.0) 08/25/2009  . HERNIA, UMBILICAL [id number]  . HYPERLIPIDEMIA 07/09/2008  . HYPERTENSION 05/20/2007  . LIBIDO, DECREASED 03/27/2010  . Mitral regurgitation 09/12/2011  . 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 CATARACT EXTRACTION     bilateral  . COLONOSCOPY  04-24-2005  . s/p ankle surgury    . s/p basal cell cancer    . s/p right elbow surugury     Social History   Social History  . Marital status: Married    Spouse name: N/A  . Number of children: 3  . Years of education: N/A   Occupational History  . [employer]     Social History Main Topics  . Smoking status: Former Research scientist (life sciences)  . Smokeless tobacco: Never Used  . Alcohol use 0.0 oz/week     Comment: socially  . Drug  use: No  . Sexual activity: Not on file   Other Topics Concern  . Not on file   Social History Narrative  . No narrative on file   Allergies  Allergen Reactions  . Augmentin [Amoxicillin-Pot Clavulanate] Diarrhea    diarrhea   Family History  Problem Relation Age of Onset  . Hypertension Mother   . Alcohol abuse Mother     ETOH  . Hypertension Father   . Cancer Brother     Lung cancer    Past medical history, social, surgical and family history all reviewed in electronic medical record.  No pertanent information unless stated regarding to the chief complaint.   Review of Systems: No headache, visual changes, nausea, vomiting, diarrhea, constipation, dizziness, abdominal pain, skin rash, fevers, chills, night sweats, weight loss, swollen lymph nodes, body aches, joint swelling, muscle aches, chest pain, shortness of breath, mood changes.   Objective  There were no vitals taken for this visit.  General: No apparent distress alert and oriented x3 mood and affect normal, dressed appropriately.  HEENT: Pupils equal, extraocular movements intact  Respiratory: Patient's speak in full sentences and does not appear short of breath  Cardiovascular: No lower extremity edema, non tender, no erythema  Skin: Warm dry intact with no signs of infection or rash on extremities or on axial skeleton.  Abdomen: Soft nontender  Neuro: Cranial nerves II through XII are intact, neurovascularly intact in all extremities with 2+ DTRs and 2+ pulses.  Lymph: No lymphadenopathy of posterior or anterior cervical chain or axillae bilaterally.  Gait normal with good balance and coordination.  MSK:  Non tender with full range of motion and good stability and symmetric strength and tone of elbows, wrist, hip, knee and ankles bilaterally.  Shoulder: Left Inspection reveals no abnormalities, atrophy or asymmetry. Palpation is normal with no tenderness over AC joint or bicipital groove. ROM is full in all  planes. Rotator cuff strength normal throughout. No signs of impingement with negative Neer and Hawkin's tests, empty can sign. Speeds and Yergason's tests normal. No labral pathology noted with negative Obrien's, negative clunk and good stability. Patient does have a scar over the left scapular region in patient does have some weakness of the inferior scapula. No painful arc and no drop arm sign. No apprehension sign  MSK US performed of: Left shoulder This study was ordered, performed, and interpreted by Charlann Boxer D.O.  Shoulder:   Supraspinatus:  Appears normal on long and transverse views, no bursal bulge seen with shoulder abduction on impingement view. Infraspinatus:  Appears normal on long and transverse views. Subscapularis:  Appears normal on long and transverse views. Teres Minor:  Appears normal on long and transverse views. AC joint:  Capsule undistended, no geyser sign. Glenohumeral Joint:  Appears normal without effusion. Glenoid Labrum:  Intact without visualized tears. Biceps Tendon:  Appears normal on long and transverse views, no fraying of tendon, tendon located in intertubercular groove, no subluxation with shoulder internal or external rotation. No increased power doppler signal. Impression: Normal shoulder  Osteopathic findings T3 extended rotated and side bent right with inhaled rib L2 flexed rotated and side bent right     Impression and Recommendations:     This case required medical decision making of moderate complexity.      Note: This dictation was prepared with Dragon dictation along with smaller phrase technology. Any transcriptional errors that result from this process are unintentional.

## 2016-06-08 ENCOUNTER — Other Ambulatory Visit: Payer: Self-pay

## 2016-06-08 ENCOUNTER — Encounter: Payer: Self-pay | Admitting: Family Medicine

## 2016-06-08 ENCOUNTER — Ambulatory Visit (INDEPENDENT_AMBULATORY_CARE_PROVIDER_SITE_OTHER): Payer: Medicare Other | Admitting: Family Medicine

## 2016-06-08 VITALS — BP 134/84 | HR 80 | Ht 74.0 in | Wt 206.0 lb

## 2016-06-08 DIAGNOSIS — M999 Biomechanical lesion, unspecified: Secondary | ICD-10-CM | POA: Insufficient documentation

## 2016-06-08 DIAGNOSIS — M25512 Pain in left shoulder: Secondary | ICD-10-CM

## 2016-06-08 DIAGNOSIS — G2589 Other specified extrapyramidal and movement disorders: Secondary | ICD-10-CM | POA: Diagnosis not present

## 2016-06-08 DIAGNOSIS — M9908 Segmental and somatic dysfunction of rib cage: Secondary | ICD-10-CM | POA: Diagnosis not present

## 2016-06-08 DIAGNOSIS — M9902 Segmental and somatic dysfunction of thoracic region: Secondary | ICD-10-CM | POA: Diagnosis not present

## 2016-06-08 DIAGNOSIS — M9903 Segmental and somatic dysfunction of lumbar region: Secondary | ICD-10-CM | POA: Diagnosis not present

## 2016-06-08 NOTE — Patient Instructions (Signed)
Good to see you  Ice 20 minutes 2 times daily. Usually after activity and before bed. For the back exercises 3 times a week.  Also for the elbow.  pennsaid pinkie amount topically 2 times daily as needed.  Vitamins I want you to try  B12 1027mcg daily  B6 200mg  daily  Vitamin D 2000 iU daily  Try to work on posture On wall with heels, butt shoulder and head touching for a goal of 5 minutes daily  When sitting a long amount of time try to put tennis ball between shoulder blade.s  See me again in 3 weeks

## 2016-06-08 NOTE — Assessment & Plan Note (Signed)
Patient given exercises today. Patient responded well to osteopathic manipulation. We discussed posture and ergonomics. We discussed him muscle imbalances. Trial of topical anti-inflammatories given an icing protocol. Patient follow-up again in 3-4 weeks for further evaluation and treatment.

## 2016-06-08 NOTE — Assessment & Plan Note (Signed)
Decision today to treat with OMT was based on Physical Exam  After verbal consent patient was treated with HVLA, ME techniques in thoracic, rib lumbar areas  Patient tolerated the procedure well with improvement in symptoms  Patient given exercises, stretches and lifestyle modifications  See medications in patient instructions if given  Patient will follow up in 3-4 weeks

## 2016-06-17 ENCOUNTER — Encounter: Payer: Self-pay | Admitting: Internal Medicine

## 2016-06-18 ENCOUNTER — Encounter: Payer: Self-pay | Admitting: Internal Medicine

## 2016-06-22 ENCOUNTER — Encounter: Payer: Self-pay | Admitting: Internal Medicine

## 2016-06-22 ENCOUNTER — Telehealth: Payer: Self-pay

## 2016-06-22 ENCOUNTER — Ambulatory Visit (INDEPENDENT_AMBULATORY_CARE_PROVIDER_SITE_OTHER): Payer: Medicare Other | Admitting: Internal Medicine

## 2016-06-22 VITALS — BP 132/68 | HR 77 | Temp 98.9°F | Resp 20 | Wt 203.0 lb

## 2016-06-22 DIAGNOSIS — J019 Acute sinusitis, unspecified: Secondary | ICD-10-CM | POA: Diagnosis not present

## 2016-06-22 DIAGNOSIS — I10 Essential (primary) hypertension: Secondary | ICD-10-CM

## 2016-06-22 MED ORDER — LEVOFLOXACIN 500 MG PO TABS: 500.0000 mg | ORAL_TABLET | Freq: Every day | ORAL | 0 refills | Status: DC

## 2016-06-22 MED ORDER — LEVOFLOXACIN 500 MG PO TABS: 500.0000 mg | ORAL_TABLET | Freq: Every day | ORAL | 0 refills | Status: AC

## 2016-06-22 NOTE — Telephone Encounter (Signed)
Medication sent.

## 2016-06-22 NOTE — Patient Instructions (Signed)
Please take all new medication as prescribed - the antibiotic  You can also take Delsym OTC for cough, and/or Mucinex (or it's generic off brand) for congestion, and tylenol as needed for pain.  Please continue all other medications as before, and refills have been done if requested.  Please have the pharmacy call with any other refills you may need.  Please keep your appointments with your specialists as you may have planned    

## 2016-06-22 NOTE — Progress Notes (Signed)
Pre visit review using our clinic review tool, if applicable. No additional management support is needed unless otherwise documented below in the visit note. 

## 2016-06-22 NOTE — Telephone Encounter (Signed)
Medication sent to new pharmacy

## 2016-06-22 NOTE — Progress Notes (Signed)
Subjective:    Patient ID: Oscar Dixon, male    DOB: [birth year], 70 y.o.   MRN: [id number]  HPI   Here with 2-3 days acute onset fever, facial pain, pressure, headache, general weakness and malaise, and greenish d/c, with mild ST and cough, but pt denies chest pain, wheezing, increased sob or doe, orthopnea, PND, increased LE swelling, palpitations, dizziness or syncope. Pt denies new neurological symptoms such as new headache, or facial or extremity weakness or numbness Past Medical History:  Diagnosis Date  . 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  . Arthritis   . CARPAL TUNNEL SYNDROME, BILATERAL 12/15/2009  . Cataract   . DYSPNEA ON EXERTION 12/15/2009  . Erectile dysfunction 04/12/2012  . FATIGUE 03/27/2010  . GERD 05/20/2007  . GOUT 03/27/2010  . HAND PAIN 12/15/2009  . Headache(784.0) 08/25/2009  . HERNIA, UMBILICAL 123456  . HYPERLIPIDEMIA 07/09/2008  . HYPERTENSION 05/20/2007  . LIBIDO, DECREASED 03/27/2010  . Mitral regurgitation 09/12/2011  . 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 CATARACT EXTRACTION     bilateral  . COLONOSCOPY  04-24-2005  . s/p ankle surgury    . s/p basal cell cancer    . s/p right elbow surugury      reports that he has quit smoking. He has never used smokeless tobacco. He reports that he drinks alcohol. He reports that he does not use drugs. family history includes Alcohol abuse in his mother; Cancer in his brother; Hypertension in his father and mother. Allergies  Allergen Reactions  . Augmentin [Amoxicillin-Pot Clavulanate] Diarrhea    diarrhea   Current Outpatient Prescriptions on File Prior to Visit  Medication Sig Dispense Refill  . allopurinol (ZYLOPRIM) 100 MG tablet TAKE 1 TABLET DAILY 90 tablet 1  . amLODipine (NORVASC) 5 MG tablet TAKE 1 TABLET DAILY 90 tablet 1  . aspirin 81 MG EC tablet Take 81 mg by mouth daily.      . benazepril (LOTENSIN) 20 MG tablet TAKE 1 TABLET DAILY 90 tablet 3  .  diclofenac sodium (VOLTAREN) 1 % GEL APPLY 4 GRAMS TOPICALLY FOUR TIMES A DAY AS NEEDED FOR PAIN 100 g 10  . esomeprazole (NEXIUM) 40 MG capsule Take 1 capsule (40 mg total) by mouth daily. Yearly physical due in Nov must see MD for refills 90 capsule 0  . ramelteon (ROZEREM) 8 MG tablet Take 1 tablet (8 mg total) by mouth at bedtime as needed for sleep. 90 tablet 1  . rosuvastatin (CRESTOR) 20 MG tablet TAKE 1 TABLET DAILY 90 tablet 0  . zolpidem (AMBIEN) 5 MG tablet Take 1 tablet (5 mg total) by mouth at bedtime. 90 tablet 1   No current facility-administered medications on file prior to visit.    Review of Systems All otherwise neg per pt     Objective:   Physical Exam BP 132/68   Pulse 77   Temp 98.9 F (37.2 C) (Oral)   Resp 20   Wt 203 lb (92.1 kg)   SpO2 96%   BMI 26.06 kg/m  VS noted, mild ill Constitutional: Pt appears in no apparent distress HENT: Head: NCAT.  Right Ear: External ear normal.  Left Ear: External ear normal.  Bilat tm's with mild erythema.  Max sinus areas mild tender.  Pharynx with mild erythema, no exudate Eyes: . Pupils are equal, round, and reactive to light. Conjunctivae and EOM are normal Neck: Normal range of motion. Neck supple.  Cardiovascular:  Normal rate and regular rhythm.   Pulmonary/Chest: Effort normal and breath sounds without rales or wheezing.  Neurological: Pt is alert. Not confused , motor grossly intact Skin: Skin is warm. No rash, no LE edema Psychiatric: Pt behavior is normal. No agitation.     Assessment & Plan:

## 2016-06-23 NOTE — Assessment & Plan Note (Signed)
Mild to mod, for antibx course,  to f/u any worsening symptoms or concerns 

## 2016-06-23 NOTE — Assessment & Plan Note (Signed)
stable overall by history and exam, recent data reviewed with pt, and pt to continue medical treatment as before,  to f/u any worsening symptoms or concerns BP Readings from Last 3 Encounters:  06/22/16 132/68  06/08/16 134/84  09/27/15 136/84

## 2016-06-25 ENCOUNTER — Encounter: Payer: Self-pay | Admitting: Family Medicine

## 2016-07-03 ENCOUNTER — Ambulatory Visit: Payer: Medicare Other | Admitting: Family Medicine

## 2016-07-13 DIAGNOSIS — Z23 Encounter for immunization: Secondary | ICD-10-CM | POA: Diagnosis not present

## 2016-07-17 ENCOUNTER — Other Ambulatory Visit: Payer: Self-pay | Admitting: Internal Medicine

## 2016-07-31 ENCOUNTER — Other Ambulatory Visit: Payer: Self-pay | Admitting: Internal Medicine

## 2016-08-14 ENCOUNTER — Encounter: Payer: Self-pay | Admitting: Internal Medicine

## 2016-08-31 ENCOUNTER — Other Ambulatory Visit: Payer: Self-pay | Admitting: Internal Medicine

## 2016-09-17 ENCOUNTER — Encounter: Payer: Self-pay | Admitting: Internal Medicine

## 2016-10-02 ENCOUNTER — Ambulatory Visit (INDEPENDENT_AMBULATORY_CARE_PROVIDER_SITE_OTHER): Payer: Medicare Other | Admitting: Internal Medicine

## 2016-10-02 ENCOUNTER — Encounter: Payer: Self-pay | Admitting: Internal Medicine

## 2016-10-02 ENCOUNTER — Other Ambulatory Visit (INDEPENDENT_AMBULATORY_CARE_PROVIDER_SITE_OTHER): Payer: Medicare Other

## 2016-10-02 VITALS — BP 122/78 | HR 69 | Temp 97.8°F | Resp 20 | Wt 203.2 lb

## 2016-10-02 DIAGNOSIS — Z23 Encounter for immunization: Secondary | ICD-10-CM | POA: Diagnosis not present

## 2016-10-02 DIAGNOSIS — I10 Essential (primary) hypertension: Secondary | ICD-10-CM | POA: Diagnosis not present

## 2016-10-02 DIAGNOSIS — H1013 Acute atopic conjunctivitis, bilateral: Secondary | ICD-10-CM

## 2016-10-02 DIAGNOSIS — H1010 Acute atopic conjunctivitis, unspecified eye: Secondary | ICD-10-CM | POA: Insufficient documentation

## 2016-10-02 DIAGNOSIS — E785 Hyperlipidemia, unspecified: Secondary | ICD-10-CM

## 2016-10-02 DIAGNOSIS — R739 Hyperglycemia, unspecified: Secondary | ICD-10-CM | POA: Insufficient documentation

## 2016-10-02 DIAGNOSIS — N320 Bladder-neck obstruction: Secondary | ICD-10-CM

## 2016-10-02 DIAGNOSIS — Z299 Encounter for prophylactic measures, unspecified: Secondary | ICD-10-CM

## 2016-10-02 DIAGNOSIS — Z0000 Encounter for general adult medical examination without abnormal findings: Secondary | ICD-10-CM | POA: Diagnosis not present

## 2016-10-02 LAB — CBC WITH DIFFERENTIAL/PLATELET
Basophils Absolute: 0 10*3/uL (ref 0.0–0.1)
Basophils Relative: 0.6 % (ref 0.0–3.0)
EOS PCT: 8.1 % — AB (ref 0.0–5.0)
Eosinophils Absolute: 0.6 10*3/uL (ref 0.0–0.7)
HCT: 41.4 % (ref 39.0–52.0)
Hemoglobin: 14 g/dL (ref 13.0–17.0)
LYMPHS ABS: 1.9 10*3/uL (ref 0.7–4.0)
Lymphocytes Relative: 27.7 % (ref 12.0–46.0)
MCHC: 33.8 g/dL (ref 30.0–36.0)
MCV: 87.4 fl (ref 78.0–100.0)
MONO ABS: 0.5 10*3/uL (ref 0.1–1.0)
Monocytes Relative: 7.3 % (ref 3.0–12.0)
NEUTROS PCT: 56.3 % (ref 43.0–77.0)
Neutro Abs: 3.8 10*3/uL (ref 1.4–7.7)
PLATELETS: 191 10*3/uL (ref 150.0–400.0)
RBC: 4.73 Mil/uL (ref 4.22–5.81)
RDW: 12.7 % (ref 11.5–15.5)
WBC: 6.8 10*3/uL (ref 4.0–10.5)

## 2016-10-02 LAB — LIPID PANEL
CHOL/HDL RATIO: 2
Cholesterol: 126 mg/dL (ref 0–200)
HDL: 52.5 mg/dL (ref >39.00)
LDL Cholesterol: 62 mg/dL (ref 0–99)
NONHDL: 73.16
Triglycerides: 55 mg/dL (ref 0.0–149.0)
VLDL: 11 mg/dL (ref 0.0–40.0)

## 2016-10-02 LAB — BASIC METABOLIC PANEL
BUN: 19 mg/dL (ref 6–23)
CO2: 29 mEq/L (ref 19–32)
Calcium: 9.3 mg/dL (ref 8.4–10.5)
Chloride: 106 mEq/L (ref 96–112)
Creatinine, Ser: 1.15 mg/dL (ref 0.40–1.50)
GFR: 67.21 mL/min (ref >60.00)
Glucose, Bld: 103 mg/dL — ABNORMAL HIGH (ref 70–99)
POTASSIUM: 4.5 meq/L (ref 3.5–5.1)
SODIUM: 142 meq/L (ref 135–145)

## 2016-10-02 LAB — TSH: TSH: 0.87 u[IU]/mL (ref 0.35–4.50)

## 2016-10-02 LAB — URINALYSIS, ROUTINE W REFLEX MICROSCOPIC
Bilirubin Urine: NEGATIVE
Hgb urine dipstick: NEGATIVE
Ketones, ur: NEGATIVE
Leukocytes, UA: NEGATIVE
Nitrite: NEGATIVE
PH: 6 (ref 5.0–8.0)
RBC / HPF: NONE SEEN (ref 0–?)
Specific Gravity, Urine: 1.025 (ref 1.000–1.030)
TOTAL PROTEIN, URINE-UPE24: NEGATIVE
Urine Glucose: NEGATIVE
Urobilinogen, UA: 0.2 (ref 0.0–1.0)
WBC, UA: NONE SEEN (ref 0–?)

## 2016-10-02 LAB — HEPATIC FUNCTION PANEL
ALT: 19 U/L (ref 0–53)
AST: 21 U/L (ref 0–37)
Albumin: 4.3 g/dL (ref 3.5–5.2)
Alkaline Phosphatase: 38 U/L — ABNORMAL LOW (ref 39–117)
Bilirubin, Direct: 0.2 mg/dL (ref 0.0–0.3)
TOTAL PROTEIN: 6.9 g/dL (ref 6.0–8.3)
Total Bilirubin: 0.7 mg/dL (ref 0.2–1.2)

## 2016-10-02 LAB — PSA: PSA: 2.3 ng/mL (ref 0.10–4.00)

## 2016-10-02 NOTE — Patient Instructions (Addendum)
You had tetanus (Tdap) shot today  Your left ear was irrigated of wax today  OK for Zaditor OTC for the eyes (or call for Optivar prescription if not helping enough)  Please continue all other medications as before, and refills have been done if requested.  Please have the pharmacy call with any other refills you may need.  Please continue your efforts at being more active, low cholesterol diet, and weight control.  You are otherwise up to date with prevention measures today.  Please keep your appointments with your specialists as you may have planned  You had lab work done this AM  You will be contacted by phone if any changes need to be made immediately.  Otherwise, you will receive a letter about your results with an explanation, but please check with MyChart first.  Please remember to sign up for MyChart if you have not done so, as this will be important to you in the future with finding out test results, communicating by private email, and scheduling acute appointments online when needed.  If you have Medicare related insurance (such as traditoinal Medicare, [employer] or [employer], or similar), Please make an appointment at the Scheduling desk with Maudie Mercury, the [employer], for your Wellness Visit in this office, which is a benefit with your insurance.  Please return in 1 year for your yearly visit, or sooner if needed

## 2016-10-02 NOTE — Progress Notes (Signed)
Pre visit review using our clinic review tool, if applicable. No additional management support is needed unless otherwise documented below in the visit note. 

## 2016-10-02 NOTE — Progress Notes (Signed)
Subjective:    Patient ID: Oscar Dixon, male    DOB: [birth year], 70 y.o.   MRN: [id number]  HPI  Here for yearly f/u;  Overall doing ok;  Pt denies Chest pain, worsening SOB, DOE, wheezing, orthopnea, PND, worsening LE edema, palpitations, dizziness or syncope.  Pt denies neurological change such as new headache, facial or extremity weakness.  Pt denies polydipsia, polyuria, or low sugar symptoms. Pt states overall good compliance with treatment and medications, good tolerability, and has been trying to follow appropriate diet.  Pt denies worsening depressive symptoms, suicidal ideation or panic. No fever, night sweats, wt loss, loss of appetite, or other constitutional symptoms.  Pt states good ability with ADL's, has low fall risk, home safety reviewed and adequate, no other significant changes in hearing or vision, and only occasionally active with exercise.  Wt Readings from Last 3 Encounters:  10/02/16 203 lb 4 oz (92.2 kg)  06/22/16 203 lb (92.1 kg)  06/08/16 206 lb (93.4 kg)  Does have Eyes itching, some blurry vision, plans to see optho, last visit 4 yrs ago. Also c/o left ear hearing loss  - ? Wax impaction again, x 1 wk, mild without pain, d/c or HA.  Due for Tdap today Past Medical History:  Diagnosis Date  . 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  . Arthritis   . CARPAL TUNNEL SYNDROME, BILATERAL 12/15/2009  . Cataract   . DYSPNEA ON EXERTION 12/15/2009  . Erectile dysfunction 04/12/2012  . FATIGUE 03/27/2010  . GERD 05/20/2007  . GOUT 03/27/2010  . HAND PAIN 12/15/2009  . Headache(784.0) 08/25/2009  . HERNIA, UMBILICAL 123456  . HYPERLIPIDEMIA 07/09/2008  . HYPERTENSION 05/20/2007  . LIBIDO, DECREASED 03/27/2010  . Mitral regurgitation 09/12/2011  . 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 CATARACT EXTRACTION     bilateral  . COLONOSCOPY  04-24-2005  . s/p ankle surgury    . s/p basal cell cancer    . s/p right elbow surugury      reports that he has quit smoking. He has never used smokeless tobacco. He reports that he drinks alcohol. He reports that he does not use drugs. family history includes Alcohol abuse in his mother; Cancer in his brother; Hypertension in his father and mother. Allergies  Allergen Reactions  . Augmentin [Amoxicillin-Pot Clavulanate] Diarrhea    diarrhea   Current Outpatient Prescriptions on File Prior to Visit  Medication Sig Dispense Refill  . allopurinol (ZYLOPRIM) 100 MG tablet TAKE 1 TABLET DAILY 90 tablet 1  . amLODipine (NORVASC) 5 MG tablet TAKE 1 TABLET DAILY 90 tablet 1  . aspirin 81 MG EC tablet Take 81 mg by mouth daily.      . benazepril (LOTENSIN) 20 MG tablet TAKE 1 TABLET DAILY 90 tablet 3  . diclofenac sodium (VOLTAREN) 1 % GEL APPLY 4 GRAMS TOPICALLY FOUR TIMES A DAY AS NEEDED FOR PAIN 100 g 10  . esomeprazole (NEXIUM) 40 MG capsule TAKE 1 CAPSULE DAILY. (YEARLY PHYSICAL DUE IN NOVEMBER. MUST SEE DOCTOR FOR REFILLS.) 90 capsule 0  . ramelteon (ROZEREM) 8 MG tablet Take 1 tablet (8 mg total) by mouth at bedtime as needed for sleep. 90 tablet 1  . rosuvastatin (CRESTOR) 20 MG tablet TAKE 1 TABLET DAILY 90 tablet 0  . zolpidem (AMBIEN) 5 MG tablet Take 1 tablet (5 mg total) by mouth at bedtime. 90 tablet 1   No current facility-administered medications on file prior to  visit.    Review of Systems Constitutional: Negative for increased diaphoresis, or other activity, appetite or siginficant weight change other than noted HENT: Negative for worsening hearing loss, ear pain, facial swelling, mouth sores and neck stiffness.   Eyes: Negative for other worsening pain, redness or visual disturbance.  Respiratory: Negative for choking or stridor Cardiovascular: Negative for other chest pain and palpitations.  Gastrointestinal: Negative for worsening diarrhea, blood in stool, or abdominal distention Genitourinary: Negative for hematuria, flank pain or change in urine volume.    Musculoskeletal: Negative for myalgias or other joint complaints.  Skin: Negative for other color change and wound or drainage.  Neurological: Negative for syncope and numbness. other than noted Hematological: Negative for adenopathy. or other swelling Psychiatric/Behavioral: Negative for hallucinations, SI, self-injury, decreased concentration or other worsening agitation.  All other system neg per pt    Objective:   Physical Exam BP 122/78   Pulse 69   Temp 97.8 F (36.6 C) (Oral)   Resp 20   Wt 203 lb 4 oz (92.2 kg)   SpO2 97%   BMI 26.10 kg/m  VS noted,  Constitutional: Pt is oriented to person, place, and time. Appears well-developed and well-nourished, in no significant distress Head: Normocephalic and atraumatic  Eyes: Conjunctivae with bilat mild erythema and clearish d/c, and EOM are normal. Pupils are equal, round, and reactive to light Right Ear: External ear normal.  Left Ear: External ear normal Nose: Nose normal.  Left ear wax impaction resolved with irrigation, hearing improved Mouth/Throat: Oropharynx is clear and moist  Neck: Normal range of motion. Neck supple. No JVD present. No tracheal deviation present or significant neck LA or mass Cardiovascular: Normal rate, regular rhythm, normal heart sounds and intact distal pulses.   Pulmonary/Chest: Effort normal and breath sounds without rales or wheezing  Abdominal: Soft. Bowel sounds are normal. NT. No HSM  Musculoskeletal: Normal range of motion. Exhibits no edema Lymphadenopathy: Has no cervical adenopathy.  Neurological: Pt is alert and oriented to person, place, and time. Pt has normal reflexes. No cranial nerve deficit. Motor grossly intact Skin: Skin is warm and dry. No rash noted or new ulcers Psychiatric:  Has normal mood and affect. Behavior is normal.  No other new exam findings    Assessment & Plan:

## 2016-10-08 DIAGNOSIS — Z961 Presence of intraocular lens: Secondary | ICD-10-CM | POA: Diagnosis not present

## 2016-10-08 DIAGNOSIS — H5213 Myopia, bilateral: Secondary | ICD-10-CM | POA: Diagnosis not present

## 2016-10-08 NOTE — Assessment & Plan Note (Signed)
Mild, for otc zaditor prn,  to f/u any worsening symptoms or concerns

## 2016-10-08 NOTE — Assessment & Plan Note (Signed)
stable overall by history and exam, recent data reviewed with pt, and pt to continue medical treatment as before,  to f/u any worsening symptoms or concerns Lab Results  Component Value Date   LDLCALC 62 10/02/2016

## 2016-10-08 NOTE — Assessment & Plan Note (Signed)
stable overall by history and exam, recent data reviewed with pt, and pt to continue medical treatment as before,  to f/u any worsening symptoms or concerns BP Readings from Last 3 Encounters:  10/02/16 122/78  06/22/16 132/68  06/08/16 134/84

## 2016-10-08 NOTE — Assessment & Plan Note (Signed)
stable overall by history and exam, and pt to continue medical treatment as before,  to f/u any worsening symptoms or concerns 

## 2016-10-11 ENCOUNTER — Other Ambulatory Visit: Payer: Self-pay | Admitting: Internal Medicine

## 2016-10-11 NOTE — Telephone Encounter (Signed)
faxed

## 2016-10-11 NOTE — Telephone Encounter (Signed)
Done hardcopy to Corinne  

## 2016-10-12 ENCOUNTER — Other Ambulatory Visit: Payer: Self-pay

## 2016-10-15 ENCOUNTER — Other Ambulatory Visit: Payer: Self-pay | Admitting: Internal Medicine

## 2016-10-22 ENCOUNTER — Encounter: Payer: Self-pay | Admitting: Internal Medicine

## 2016-10-29 ENCOUNTER — Other Ambulatory Visit: Payer: Self-pay | Admitting: Internal Medicine

## 2017-01-07 ENCOUNTER — Encounter: Payer: Self-pay | Admitting: Internal Medicine

## 2017-01-07 MED ORDER — ESOMEPRAZOLE MAGNESIUM 40 MG PO CPDR: 40.0000 mg | DELAYED_RELEASE_CAPSULE | Freq: Every day | ORAL | 2 refills | Status: DC

## 2017-01-30 ENCOUNTER — Encounter: Payer: Self-pay | Admitting: Internal Medicine

## 2017-01-30 MED ORDER — OSELTAMIVIR PHOSPHATE 75 MG PO CAPS: 75.0000 mg | ORAL_CAPSULE | Freq: Two times a day (BID) | ORAL | 0 refills | Status: DC

## 2017-01-31 ENCOUNTER — Other Ambulatory Visit (INDEPENDENT_AMBULATORY_CARE_PROVIDER_SITE_OTHER): Payer: Medicare Other

## 2017-01-31 ENCOUNTER — Ambulatory Visit (INDEPENDENT_AMBULATORY_CARE_PROVIDER_SITE_OTHER): Payer: Medicare Other | Admitting: Internal Medicine

## 2017-01-31 ENCOUNTER — Ambulatory Visit (INDEPENDENT_AMBULATORY_CARE_PROVIDER_SITE_OTHER)
Admission: RE | Admit: 2017-01-31 | Discharge: 2017-01-31 | Disposition: A | Discharge status: Home / Self Care | Payer: Medicare Other | Source: Ambulatory Visit | Attending: Internal Medicine | Admitting: Internal Medicine

## 2017-01-31 ENCOUNTER — Encounter: Payer: Self-pay | Admitting: Internal Medicine

## 2017-01-31 VITALS — BP 118/74 | HR 81 | Temp 98.9°F | Ht 74.0 in | Wt 200.0 lb

## 2017-01-31 DIAGNOSIS — R6889 Other general symptoms and signs: Secondary | ICD-10-CM

## 2017-01-31 DIAGNOSIS — I10 Essential (primary) hypertension: Secondary | ICD-10-CM

## 2017-01-31 DIAGNOSIS — R509 Fever, unspecified: Secondary | ICD-10-CM

## 2017-01-31 DIAGNOSIS — R05 Cough: Secondary | ICD-10-CM | POA: Diagnosis not present

## 2017-01-31 LAB — CBC WITH DIFFERENTIAL/PLATELET
Basophils Absolute: 0.1 10*3/uL (ref 0.0–0.1)
Basophils Relative: 0.5 % (ref 0.0–3.0)
EOS ABS: 0.2 10*3/uL (ref 0.0–0.7)
EOS PCT: 2.1 % (ref 0.0–5.0)
HCT: 45.1 % (ref 39.0–52.0)
Hemoglobin: 15.4 g/dL (ref 13.0–17.0)
LYMPHS ABS: 1.4 10*3/uL (ref 0.7–4.0)
Lymphocytes Relative: 13.9 % (ref 12.0–46.0)
MCHC: 34.2 g/dL (ref 30.0–36.0)
MCV: 87.5 fl (ref 78.0–100.0)
MONO ABS: 0.8 10*3/uL (ref 0.1–1.0)
Monocytes Relative: 8.3 % (ref 3.0–12.0)
NEUTROS PCT: 75.2 % (ref 43.0–77.0)
Neutro Abs: 7.4 10*3/uL (ref 1.4–7.7)
Platelets: 191 10*3/uL (ref 150.0–400.0)
RBC: 5.15 Mil/uL (ref 4.22–5.81)
RDW: 13 % (ref 11.5–15.5)
WBC: 9.9 10*3/uL (ref 4.0–10.5)

## 2017-01-31 LAB — BASIC METABOLIC PANEL
BUN: 16 mg/dL (ref 6–23)
CALCIUM: 9.8 mg/dL (ref 8.4–10.5)
CO2: 29 meq/L (ref 19–32)
Chloride: 100 mEq/L (ref 96–112)
Creatinine, Ser: 1.29 mg/dL (ref 0.40–1.50)
GFR: 58.81 mL/min — AB (ref >60.00)
Glucose, Bld: 137 mg/dL — ABNORMAL HIGH (ref 70–99)
POTASSIUM: 4.1 meq/L (ref 3.5–5.1)
SODIUM: 137 meq/L (ref 135–145)

## 2017-01-31 LAB — POC INFLUENZA A&B (BINAX/QUICKVUE)

## 2017-01-31 MED ORDER — LEVOFLOXACIN 500 MG PO TABS: 500.0000 mg | ORAL_TABLET | Freq: Every day | ORAL | 0 refills | Status: AC

## 2017-01-31 NOTE — Progress Notes (Signed)
Subjective:    Patient ID: Oscar Dixon, male    DOB: [birth year], 71 y.o.   MRN: [id number]  HPI  Here with acute onset 3 days fever to 103, ST, HA, non prod cough, general weakness and malaise, ? Mild wheezing and sob/doe very mild.  No chills, rash or joint pain but does ache all over.  Has been exposed to grandkids with cough but didn't seem ill otherwise.  Started tamiflu last PM Past Medical History:  Diagnosis Date  . 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  . Arthritis   . CARPAL TUNNEL SYNDROME, BILATERAL 12/15/2009  . Cataract   . DYSPNEA ON EXERTION 12/15/2009  . Erectile dysfunction 04/12/2012  . FATIGUE 03/27/2010  . GERD 05/20/2007  . GOUT 03/27/2010  . HAND PAIN 12/15/2009  . Headache(784.0) 08/25/2009  . HERNIA, UMBILICAL 5/00/9381  . HYPERLIPIDEMIA 07/09/2008  . HYPERTENSION 05/20/2007  . LIBIDO, DECREASED 03/27/2010  . Mitral regurgitation 09/12/2011  . 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 CATARACT EXTRACTION     bilateral  . COLONOSCOPY  04-24-2005  . s/p ankle surgury    . s/p basal cell cancer    . s/p right elbow surugury      reports that he has quit smoking. He has never used smokeless tobacco. He reports that he drinks alcohol. He reports that he does not use drugs. family history includes Alcohol abuse in his mother; Cancer in his brother; Hypertension in his father and mother. Allergies  Allergen Reactions  . Augmentin [Amoxicillin-Pot Clavulanate] Diarrhea    diarrhea   Current Outpatient Prescriptions on File Prior to Visit  Medication Sig Dispense Refill  . allopurinol (ZYLOPRIM) 100 MG tablet TAKE 1 TABLET DAILY 90 tablet 1  . amLODipine (NORVASC) 5 MG tablet TAKE 1 TABLET DAILY 90 tablet 1  . aspirin 81 MG EC tablet Take 81 mg by mouth daily.      . benazepril (LOTENSIN) 20 MG tablet TAKE 1 TABLET DAILY 90 tablet 3  . diclofenac sodium (VOLTAREN) 1 % GEL APPLY 4 GRAMS TOPICALLY FOUR TIMES A DAY AS NEEDED  FOR PAIN 100 g 10  . esomeprazole (NEXIUM) 40 MG capsule Take 1 capsule (40 mg total) by mouth daily at 12 noon. 90 capsule 2  . oseltamivir (TAMIFLU) 75 MG capsule Take 1 capsule (75 mg total) by mouth 2 (two) times daily. 10 capsule 0  . ramelteon (ROZEREM) 8 MG tablet Take 1 tablet (8 mg total) by mouth at bedtime as needed for sleep. 90 tablet 1  . rosuvastatin (CRESTOR) 20 MG tablet TAKE 1 TABLET DAILY 90 tablet 3  . zolpidem (AMBIEN) 5 MG tablet take 1 tablet by mouth at bedtime 90 tablet 1   No current facility-administered medications on file prior to visit.      Review of Systems .All otherwise neg per pt    Objective:   Physical Exam BP 118/74   Pulse 81   Temp 98.9 F (37.2 C) (Oral)   Ht 6\' 2"  (1.88 m)   Wt 200 lb (90.7 kg)   SpO2 98%   BMI 25.68 kg/m  VS noted, ,mild ill Constitutional: Pt appears in no apparent distress HENT: Head: NCAT.  Right Ear: External ear normal.  Left Ear: External ear normal.  Bilat tm's with mild erythema.  Max sinus areas non tender.  Pharynx with mild erythema, no exudate Eyes: . Pupils are equal, round, and reactive to light. Conjunctivae and  EOM are normal Neck: Normal range of motion. Neck supple.  Cardiovascular: Normal rate and regular rhythm.   Pulmonary/Chest: Effort normal and breath sounds decreased without rales or wheezing. , a few rhonchi noted Abd:  Soft, NT, ND, + BS Neurological: Pt is alert. Not confused , motor grossly intact Skin: Skin is warm. No rash, no LE edema Psychiatric: Pt behavior is normal. No agitation.   Flu test POCT - neg    Assessment & Plan:

## 2017-01-31 NOTE — Progress Notes (Signed)
Pre visit review using our clinic review tool, if applicable. No additional management support is needed unless otherwise documented below in the visit note. 

## 2017-01-31 NOTE — Assessment & Plan Note (Signed)
Etiology unclear, exam c/w viral vs atypical pna, for empiric levaquin, cxr, cbc,  to f/u any worsening symptoms or concerns

## 2017-01-31 NOTE — Assessment & Plan Note (Signed)
stable overall by history and exam, recent data reviewed with pt, and pt to continue medical treatment as before,  to f/u any worsening symptoms or concerns  BP Readings from Last 3 Encounters:  01/31/17 118/74  10/02/16 122/78  06/22/16 132/68

## 2017-01-31 NOTE — Patient Instructions (Signed)
Please take all new medication as prescribed - the antibiotic  Please continue all other medications as before, and refills have been done if requested.  Please have the pharmacy call with any other refills you may need.  Please keep your appointments with your specialists as you may have planned  Please go to the XRAY Department in the Basement (go straight as you get off the elevator) for the x-ray testing  Please go to the LAB in the Basement (turn left off the elevator) for the tests to be done today  You will be contacted by phone if any changes need to be made immediately.  Otherwise, you will receive a letter about your results with an explanation, but please check with MyChart first.  Please remember to sign up for MyChart if you have not done so, as this will be important to you in the future with finding out test results, communicating by private email, and scheduling acute appointments online when needed.   

## 2017-04-17 ENCOUNTER — Other Ambulatory Visit: Payer: Self-pay | Admitting: Internal Medicine

## 2017-04-17 NOTE — Telephone Encounter (Signed)
Done hardcopy to Shirron  

## 2017-04-17 NOTE — Telephone Encounter (Signed)
faxed

## 2017-04-25 ENCOUNTER — Encounter: Payer: Self-pay | Admitting: Internal Medicine

## 2017-04-25 MED ORDER — AMLODIPINE BESYLATE 10 MG PO TABS: 10.0000 mg | ORAL_TABLET | Freq: Every day | ORAL | 3 refills | Status: DC

## 2017-04-25 NOTE — Telephone Encounter (Signed)
shirron to see above note and contact pt

## 2017-05-06 ENCOUNTER — Encounter: Payer: Self-pay | Admitting: Internal Medicine

## 2017-05-06 MED ORDER — BENAZEPRIL HCL 20 MG PO TABS: 20.0000 mg | ORAL_TABLET | Freq: Every day | ORAL | 1 refills | Status: DC

## 2017-06-09 ENCOUNTER — Encounter: Payer: Self-pay | Admitting: Internal Medicine

## 2017-06-11 MED ORDER — ROSUVASTATIN CALCIUM 20 MG PO TABS: 20.0000 mg | ORAL_TABLET | Freq: Every day | ORAL | 0 refills | Status: DC

## 2017-07-08 ENCOUNTER — Encounter: Payer: Self-pay | Admitting: Internal Medicine

## 2017-07-09 NOTE — Telephone Encounter (Signed)
shirron to please helpt pt make Appt with Dr Raeford Razor for foot pain

## 2017-07-23 DIAGNOSIS — Z23 Encounter for immunization: Secondary | ICD-10-CM | POA: Diagnosis not present

## 2017-08-02 ENCOUNTER — Ambulatory Visit: Payer: Self-pay

## 2017-08-02 ENCOUNTER — Encounter: Payer: Self-pay | Admitting: Family Medicine

## 2017-08-02 ENCOUNTER — Ambulatory Visit (INDEPENDENT_AMBULATORY_CARE_PROVIDER_SITE_OTHER): Payer: Medicare Other | Admitting: Family Medicine

## 2017-08-02 VITALS — BP 116/82 | HR 49 | Ht 74.0 in | Wt 221.0 lb

## 2017-08-02 DIAGNOSIS — M722 Plantar fascial fibromatosis: Secondary | ICD-10-CM | POA: Diagnosis not present

## 2017-08-02 DIAGNOSIS — M79672 Pain in left foot: Secondary | ICD-10-CM

## 2017-08-02 MED ORDER — VITAMIN D (ERGOCALCIFEROL) 1.25 MG (50000 UNIT) PO CAPS: 50000.0000 [IU] | ORAL_CAPSULE | ORAL | 0 refills | Status: DC

## 2017-08-02 NOTE — Progress Notes (Signed)
[facility] [address] [place],  [address] Phone: [phone] Subjective:    CC: Foot pain  [id number]  Oscar Dixon is a 71 y.o. male coming in for pain in the left foot for the past 6 months. His pain is worse in the morning and with physical activity. Cushioned shoes seem to alleviate. He has tried stretching which has seem to help his pain. Patient rates the severity of pain as 5 out of 10. Does not remember any true injury. Patient denies any numbness, any tingling, any swelling. Seems to stay localized around the heel itself       Past Medical History:  Diagnosis Date  . 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  . Arthritis   . CARPAL TUNNEL SYNDROME, BILATERAL 12/15/2009  . Cataract   . DYSPNEA ON EXERTION 12/15/2009  . Erectile dysfunction 04/12/2012  . FATIGUE 03/27/2010  . GERD 05/20/2007  . GOUT 03/27/2010  . HAND PAIN 12/15/2009  . Headache(784.0) 08/25/2009  . HERNIA, UMBILICAL 4/74/2595  . HYPERLIPIDEMIA 07/09/2008  . HYPERTENSION 05/20/2007  . LIBIDO, DECREASED 03/27/2010  . Mitral regurgitation 09/12/2011  . 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 CATARACT EXTRACTION     bilateral  . COLONOSCOPY  04-24-2005  . s/p ankle surgury    . s/p basal cell cancer    . s/p right elbow surugury     Social History   Social History  . Marital status: Married    Spouse name: N/A  . Number of children: 3  . Years of education: N/A   Occupational History  . [employer]     Social History Main Topics  . Smoking status: Former Research scientist (life sciences)  . Smokeless tobacco: Never Used  . Alcohol use 0.0 oz/week     Comment: socially  . Drug use: No  . Sexual activity: Not on file   Other Topics Concern  . Not on file   Social History Narrative  . No narrative on file   Allergies  Allergen Reactions  . Augmentin [Amoxicillin-Pot Clavulanate] Diarrhea    diarrhea   Family History  Problem  Relation Age of Onset  . Hypertension Mother   . Alcohol abuse Mother        ETOH  . Hypertension Father   . Cancer Brother        Lung cancer     Past medical history, social, surgical and family history all reviewed in electronic medical record.  No pertanent information unless stated regarding to the chief complaint.   Review of Systems:Review of systems updated and as accurate as of 08/02/17  No headache, visual changes, nausea, vomiting, diarrhea, constipation, dizziness, abdominal pain, skin rash, fevers, chills, night sweats, weight loss, swollen lymph nodes, body aches, joint swelling, chest pain, shortness of breath, mood changes. Positive muscle aches  Objective  There were no vitals taken for this visit. Systems examined below as of 08/02/17   General: No apparent distress alert and oriented x3 mood and affect normal, dressed appropriately.  HEENT: Pupils equal, extraocular movements intact  Respiratory: Patient's speak in full sentences and does not appear short of breath  Cardiovascular: No lower extremity edema, non tender, no erythema  Skin: Warm dry intact with no signs of infection or rash on extremities or on axial skeleton.  Abdomen: Soft nontender  Neuro: Cranial nerves II through XII are intact, neurovascularly intact in all extremities with 2+ DTRs and 2+  pulses.  Lymph: No lymphadenopathy of posterior or anterior cervical chain or axillae bilaterally.  Gait normal with good balance and coordination.  MSK:  Non tender with full range of motion and good stability and symmetric strength and tone of shoulders, elbows, wrist, hip, knee and ankles bilaterally.  Foot exam shows Patient does have some mild breakdown longitudinal arch of the left foot. Patient is minimally tender to palpation over the medial and posterior calcaneal region. No pain over the Achilles. Full range of motion   Limited musculoskeletal ultrasound was performed and interpreted by Lyndal Pulley   Limited nausea skeletal ultrasound of patient's heel area shows the patient does have very mild enlargement of 1.2 cm of the plantar fascia. No increasing Doppler flow. No true abnormality of the calcaneal region. Achilles tendon unremarkable.    Procedure note 12458; 15 additional minutes spent for Therapeutic exercises as stated in above notes.  This included exercises focusing on stretching, strengthening, with significant focus on eccentric aspects.   Long term goals include an improvement in range of motion, strength, endurance as well as avoiding reinjury. Patient's frequency would include in 1-2 times a day, 3-5 times a week for a duration of 6-12 weeks. Plantar Fascitis: We reviewed that stretching is critically important to the treatment of PF. Reviewed footwear. Rigid soles have been shown to help with PF. Night splints can help. Reviewed rehab of stretching and calf raises. theraband given  Could benefit from a corticosteroid injection, orthotics, or other measures if conservative treatment fails.  Proper technique shown and discussed handout in great detail with ATC.  All questions were discussed and answered.   Impression and Recommendations:     This case required medical decision making of moderate complexity.      Note: This dictation was prepared with Dragon dictation along with smaller phrase technology. Any transcriptional errors that result from this process are unintentional.

## 2017-08-02 NOTE — Assessment & Plan Note (Signed)
Mild plantar fasciitis noted. No signs of any true stress fracture at this time. We discussed icing regimen and home exercises. Discussed which activities doing which was to avoid. Patient is going to increase activity slowly over the course of the next several days. Patient will wear more of a rigid shoe. Topical anti-inflammatory's prescribed, oral vitamin D prescription dose to help with muscle strength and endurance. Follow-up again in 4 weeks

## 2017-08-02 NOTE — Patient Instructions (Addendum)
Good to see you  Oscar Dixon is your friend. 10 minutes at night would be great  Stay active but biking or elliptical may be better Avoid being barefoot even in the house.  [employer] orthotics "total support" online would be great  Rigid shoes are better pennsaid pinkie amount topically 2 times daily as needed.   Once weekly vitamin D for 12 weeks.  See me again in 4-6 weeks

## 2017-08-11 ENCOUNTER — Encounter: Payer: Self-pay | Admitting: Internal Medicine

## 2017-08-12 MED ORDER — ZOSTER VAC RECOMB ADJUVANTED 50 MCG/0.5ML IM SUSR
0.5000 mL | Freq: Once | INTRAMUSCULAR | 1 refills | Status: AC
Start: 2017-08-12 — End: 2017-08-12

## 2017-09-08 ENCOUNTER — Other Ambulatory Visit: Payer: Self-pay | Admitting: Internal Medicine

## 2017-09-10 ENCOUNTER — Ambulatory Visit: Payer: Medicare Other | Admitting: Family Medicine

## 2017-09-30 ENCOUNTER — Encounter: Payer: Self-pay | Admitting: Internal Medicine

## 2017-09-30 ENCOUNTER — Other Ambulatory Visit: Payer: Self-pay | Admitting: Internal Medicine

## 2017-09-30 MED ORDER — MECLIZINE HCL 12.5 MG PO TABS: 12.5000 mg | ORAL_TABLET | Freq: Three times a day (TID) | ORAL | 1 refills | Status: AC | PRN

## 2017-10-02 ENCOUNTER — Other Ambulatory Visit: Payer: Self-pay | Admitting: Internal Medicine

## 2017-10-15 ENCOUNTER — Other Ambulatory Visit: Payer: Self-pay | Admitting: Internal Medicine

## 2017-10-16 NOTE — Telephone Encounter (Signed)
Done erx 

## 2017-10-26 ENCOUNTER — Other Ambulatory Visit: Payer: Self-pay | Admitting: Internal Medicine

## 2017-11-07 ENCOUNTER — Encounter: Payer: Self-pay | Admitting: Internal Medicine

## 2017-11-07 ENCOUNTER — Ambulatory Visit (INDEPENDENT_AMBULATORY_CARE_PROVIDER_SITE_OTHER): Payer: Medicare Other | Admitting: Internal Medicine

## 2017-11-07 ENCOUNTER — Other Ambulatory Visit (INDEPENDENT_AMBULATORY_CARE_PROVIDER_SITE_OTHER): Payer: Medicare Other

## 2017-11-07 VITALS — BP 124/86 | HR 74 | Temp 97.9°F | Ht 74.0 in | Wt 208.0 lb

## 2017-11-07 DIAGNOSIS — R739 Hyperglycemia, unspecified: Secondary | ICD-10-CM

## 2017-11-07 DIAGNOSIS — Z0000 Encounter for general adult medical examination without abnormal findings: Secondary | ICD-10-CM

## 2017-11-07 LAB — CBC WITH DIFFERENTIAL/PLATELET
BASOS ABS: 0.1 10*3/uL (ref 0.0–0.1)
Basophils Relative: 0.9 % (ref 0.0–3.0)
EOS ABS: 0.5 10*3/uL (ref 0.0–0.7)
Eosinophils Relative: 8 % — ABNORMAL HIGH (ref 0.0–5.0)
HEMATOCRIT: 44 % (ref 39.0–52.0)
HEMOGLOBIN: 14.6 g/dL (ref 13.0–17.0)
LYMPHS PCT: 27.7 % (ref 12.0–46.0)
Lymphs Abs: 1.8 10*3/uL (ref 0.7–4.0)
MCHC: 33.2 g/dL (ref 30.0–36.0)
MCV: 89.5 fl (ref 78.0–100.0)
MONO ABS: 0.5 10*3/uL (ref 0.1–1.0)
Monocytes Relative: 7.6 % (ref 3.0–12.0)
Neutro Abs: 3.6 10*3/uL (ref 1.4–7.7)
Neutrophils Relative %: 55.8 % (ref 43.0–77.0)
Platelets: 204 10*3/uL (ref 150.0–400.0)
RBC: 4.91 Mil/uL (ref 4.22–5.81)
RDW: 12.9 % (ref 11.5–15.5)
WBC: 6.4 10*3/uL (ref 4.0–10.5)

## 2017-11-07 LAB — HEPATIC FUNCTION PANEL
ALBUMIN: 4.5 g/dL (ref 3.5–5.2)
ALK PHOS: 41 U/L (ref 39–117)
ALT: 20 U/L (ref 0–53)
AST: 21 U/L (ref 0–37)
Bilirubin, Direct: 0.2 mg/dL (ref 0.0–0.3)
Total Bilirubin: 0.8 mg/dL (ref 0.2–1.2)
Total Protein: 7.1 g/dL (ref 6.0–8.3)

## 2017-11-07 LAB — BASIC METABOLIC PANEL
BUN: 22 mg/dL (ref 6–23)
CHLORIDE: 104 meq/L (ref 96–112)
CO2: 28 meq/L (ref 19–32)
Calcium: 9.4 mg/dL (ref 8.4–10.5)
Creatinine, Ser: 1.14 mg/dL (ref 0.40–1.50)
GFR: 67.68 mL/min (ref >60.00)
GLUCOSE: 102 mg/dL — AB (ref 70–99)
POTASSIUM: 4.5 meq/L (ref 3.5–5.1)
SODIUM: 140 meq/L (ref 135–145)

## 2017-11-07 LAB — URINALYSIS, ROUTINE W REFLEX MICROSCOPIC
BILIRUBIN URINE: NEGATIVE
HGB URINE DIPSTICK: NEGATIVE
KETONES UR: NEGATIVE
LEUKOCYTES UA: NEGATIVE
Nitrite: NEGATIVE
RBC / HPF: NONE SEEN (ref 0–?)
Specific Gravity, Urine: 1.02 (ref 1.000–1.030)
Total Protein, Urine: NEGATIVE
UROBILINOGEN UA: 0.2 (ref 0.0–1.0)
Urine Glucose: NEGATIVE
pH: 6.5 (ref 5.0–8.0)

## 2017-11-07 LAB — PSA: PSA: 3.07 ng/mL (ref 0.10–4.00)

## 2017-11-07 LAB — LIPID PANEL
Cholesterol: 128 mg/dL (ref 0–200)
HDL: 54.1 mg/dL (ref >39.00)
LDL Cholesterol: 60 mg/dL (ref 0–99)
NONHDL: 73.54
Total CHOL/HDL Ratio: 2
Triglycerides: 69 mg/dL (ref 0.0–149.0)
VLDL: 13.8 mg/dL (ref 0.0–40.0)

## 2017-11-07 LAB — TSH: TSH: 1.07 u[IU]/mL (ref 0.35–4.50)

## 2017-11-07 LAB — HEMOGLOBIN A1C: HEMOGLOBIN A1C: 6.1 % (ref 4.6–6.5)

## 2017-11-07 NOTE — Progress Notes (Signed)
Subjective:    Patient ID: Oscar Dixon, male    DOB: 19-Jan-1946, 72 y.o.   MRN: [id number]  HPI  Here for wellness and f/u;  Overall doing ok;  Pt denies Chest pain, worsening SOB, DOE, wheezing, orthopnea, PND, worsening LE edema, palpitations, dizziness or syncope.  Pt denies neurological change such as new headache, facial or extremity weakness.  Pt denies polydipsia, polyuria, or low sugar symptoms. Pt states overall good compliance with treatment and medications, good tolerability, and has been trying to follow appropriate diet.  Pt denies worsening depressive symptoms, suicidal ideation or panic. No fever, night sweats, wt loss, loss of appetite, or other constitutional symptoms.  Pt states good ability with ADL's, has low fall risk, home safety reviewed and adequate, no other significant changes in hearing or vision, and only occasionally active with exercise.  Walks 3-4 miles about 34 times per wk.  Retired x 4 yrs, playing with grandkids mostly now.  BP Readings from Last 3 Encounters:  11/07/17 124/86  08/02/17 116/82  01/31/17 118/74   Wt Readings from Last 3 Encounters:  11/07/17 208 lb (94.3 kg)  08/02/17 221 lb (100.2 kg)  01/31/17 200 lb (90.7 kg)  Plans to get the second shignrix soon.  Brother has prostate ca. No other interval hx or complaints Past Medical History:  Diagnosis Date  . 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  . Arthritis   . CARPAL TUNNEL SYNDROME, BILATERAL 12/15/2009  . Cataract   . DYSPNEA ON EXERTION 12/15/2009  . Erectile dysfunction 04/12/2012  . FATIGUE 03/27/2010  . GERD 05/20/2007  . GOUT 03/27/2010  . HAND PAIN 12/15/2009  . Headache(784.0) 08/25/2009  . HERNIA, UMBILICAL 3/97/6734  . HYPERLIPIDEMIA 07/09/2008  . HYPERTENSION 05/20/2007  . LIBIDO, DECREASED 03/27/2010  . Mitral regurgitation 09/12/2011  . 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 CATARACT EXTRACTION     bilateral  . COLONOSCOPY  04-24-2005    . s/p ankle surgury    . s/p basal cell cancer    . s/p right elbow surugury      reports that he has quit smoking. he has never used smokeless tobacco. He reports that he drinks alcohol. He reports that he does not use drugs. family history includes Alcohol abuse in his mother; Cancer in his brother; Hypertension in his father and mother. Allergies  Allergen Reactions  . Augmentin [Amoxicillin-Pot Clavulanate] Diarrhea    diarrhea   Current Outpatient Medications on File Prior to Visit  Medication Sig Dispense Refill  . allopurinol (ZYLOPRIM) 100 MG tablet TAKE 1 TABLET DAILY 90 tablet 1  . amLODipine (NORVASC) 10 MG tablet Take 1 tablet (10 mg total) by mouth daily. 90 tablet 3  . aspirin 81 MG EC tablet Take 81 mg by mouth daily.      . benazepril (LOTENSIN) 20 MG tablet take 1 tablet by mouth once daily 90 tablet 0  . diclofenac sodium (VOLTAREN) 1 % GEL APPLY 4 GRAMS TOPICALLY FOUR TIMES A DAY AS NEEDED FOR PAIN 100 g 10  . esomeprazole (NEXIUM) 40 MG capsule take 1 capsule by mouth once daily at noon 90 capsule 0  . meclizine (ANTIVERT) 12.5 MG tablet Take 1 tablet (12.5 mg total) by mouth 3 (three) times daily as needed for dizziness. 30 tablet 1  . rosuvastatin (CRESTOR) 20 MG tablet take 1 tablet by mouth once daily 90 tablet 0  . Vitamin D, Ergocalciferol, (DRISDOL) 50000 units CAPS  capsule Take 1 capsule (50,000 Units total) by mouth every 7 (seven) days. 12 capsule 0  . zolpidem (AMBIEN) 5 MG tablet take 1 tablet by mouth at bedtime 90 tablet 1   No current facility-administered medications on file prior to visit.    Review of Systems  Constitutional: Negative for other unusual diaphoresis or sweats HENT: Negative for ear discharge or swelling Eyes: Negative for other worsening visual disturbances Respiratory: Negative for stridor or other swelling  Gastrointestinal: Negative for worsening distension or other blood Genitourinary: Negative for retention or other urinary  change Musculoskeletal: Negative for other MSK pain or swelling Skin: Negative for color change or other new lesions Neurological: Negative for worsening tremors and other numbness  Psychiatric/Behavioral: Negative for worsening agitation or other fatigue All other system neg per pt    Objective:   Physical Exam BP 124/86   Pulse 74   Temp 97.9 F (36.6 C) (Oral)   Ht 6\' 2"  (1.88 m)   Wt 208 lb (94.3 kg)   SpO2 98%   BMI 26.71 kg/m  VS noted,  Constitutional: Pt is oriented to person, place, and time. Appears well-developed and well-nourished, in no significant distress and comfortable Head: Normocephalic and atraumatic  Eyes: Conjunctivae and EOM are normal. Pupils are equal, round, and reactive to light Right Ear: External ear normal without discharge Left Ear: External ear normal without discharge Nose: Nose without discharge or deformity Mouth/Throat: Oropharynx is without other ulcerations and moist  Neck: Normal range of motion. Neck supple. No JVD present. No tracheal deviation present or significant neck LA or mass Cardiovascular: Normal rate, regular rhythm, normal heart sounds and intact distal pulses.   Pulmonary/Chest: WOB normal and breath sounds without rales or wheezing  Abdominal: Soft. Bowel sounds are normal. NT. No HSM  Musculoskeletal: Normal range of motion. Exhibits no edema Lymphadenopathy: Has no other cervical adenopathy.  Neurological: Pt is alert and oriented to person, place, and time. Pt has normal reflexes. No cranial nerve deficit. Motor grossly intact, Gait intact Skin: Skin is warm and dry. No rash noted or new ulcerations Psychiatric:  Has normal mood and affect. Behavior is normal without agitation No other exam findings  Lab Results  Component Value Date   WBC 9.9 01/31/2017   HGB 15.4 01/31/2017   HCT 45.1 01/31/2017   PLT 191.0 01/31/2017   GLUCOSE 137 (H) 01/31/2017   CHOL 126 10/02/2016   TRIG 55.0 10/02/2016   HDL 52.50 10/02/2016    LDLCALC 62 10/02/2016   ALT 19 10/02/2016   AST 21 10/02/2016   NA 137 01/31/2017   K 4.1 01/31/2017   CL 100 01/31/2017   CREATININE 1.29 01/31/2017   BUN 16 01/31/2017   CO2 29 01/31/2017   TSH 0.87 10/02/2016   PSA 2.30 10/02/2016       Assessment & Plan:

## 2017-11-07 NOTE — Patient Instructions (Signed)

## 2017-11-09 ENCOUNTER — Encounter: Payer: Self-pay | Admitting: Internal Medicine

## 2017-11-09 DIAGNOSIS — R972 Elevated prostate specific antigen [PSA]: Secondary | ICD-10-CM

## 2017-11-09 NOTE — Assessment & Plan Note (Signed)

## 2017-11-09 NOTE — Assessment & Plan Note (Signed)
Lab Results  Component Value Date   HGBA1C 6.1 11/07/2017  stable overall by history and exam, recent data reviewed with pt, and pt to continue medical treatment as before,  to f/u any worsening symptoms or concerns

## 2017-12-02 ENCOUNTER — Other Ambulatory Visit: Payer: Self-pay | Admitting: Internal Medicine

## 2017-12-10 ENCOUNTER — Encounter: Payer: Self-pay | Admitting: Internal Medicine

## 2017-12-10 DIAGNOSIS — R059 Cough, unspecified: Secondary | ICD-10-CM

## 2017-12-10 DIAGNOSIS — R509 Fever, unspecified: Secondary | ICD-10-CM

## 2017-12-10 DIAGNOSIS — R05 Cough: Secondary | ICD-10-CM

## 2017-12-10 MED ORDER — HYDROCODONE-HOMATROPINE 5-1.5 MG/5ML PO SYRP: 5.0000 mL | ORAL_SOLUTION | Freq: Four times a day (QID) | ORAL | 0 refills | Status: AC | PRN

## 2017-12-12 NOTE — Addendum Note (Signed)
Addended by: Biagio Borg on: 12/12/2017 08:11 AM   Modules accepted: Orders

## 2017-12-12 NOTE — Telephone Encounter (Signed)
Staff to contact pt  Needs ROV, and also cxr same day if possible - I have ordered

## 2017-12-13 ENCOUNTER — Ambulatory Visit (INDEPENDENT_AMBULATORY_CARE_PROVIDER_SITE_OTHER): Payer: Medicare Other | Admitting: Internal Medicine

## 2017-12-13 ENCOUNTER — Encounter: Payer: Self-pay | Admitting: Internal Medicine

## 2017-12-13 ENCOUNTER — Ambulatory Visit (INDEPENDENT_AMBULATORY_CARE_PROVIDER_SITE_OTHER)
Admission: RE | Admit: 2017-12-13 | Discharge: 2017-12-13 | Disposition: A | Discharge status: Home / Self Care | Payer: Medicare Other | Source: Ambulatory Visit | Attending: Internal Medicine | Admitting: Internal Medicine

## 2017-12-13 VITALS — BP 124/82 | HR 81 | Temp 97.7°F | Ht 74.0 in | Wt 206.0 lb

## 2017-12-13 DIAGNOSIS — R509 Fever, unspecified: Secondary | ICD-10-CM

## 2017-12-13 DIAGNOSIS — R059 Cough, unspecified: Secondary | ICD-10-CM

## 2017-12-13 DIAGNOSIS — I10 Essential (primary) hypertension: Secondary | ICD-10-CM

## 2017-12-13 DIAGNOSIS — R05 Cough: Secondary | ICD-10-CM

## 2017-12-13 DIAGNOSIS — R739 Hyperglycemia, unspecified: Secondary | ICD-10-CM

## 2017-12-13 MED ORDER — LEVOFLOXACIN 500 MG PO TABS: 500.0000 mg | ORAL_TABLET | Freq: Every day | ORAL | 0 refills | Status: AC

## 2017-12-13 NOTE — Assessment & Plan Note (Signed)
stable overall by history and exam, recent data reviewed with pt, and pt to continue medical treatment as before,  to f/u any worsening symptoms or concerns Lab Results  Component Value Date   HGBA1C 6.1 11/07/2017

## 2017-12-13 NOTE — Assessment & Plan Note (Signed)
Mild to mod, c/w bronchitis vs pna, for antibx course pending cxr,  to f/u any worsening symptoms or concerns

## 2017-12-13 NOTE — Assessment & Plan Note (Signed)
stable overall by history and exam, and pt to continue medical treatment as before,  to f/u any worsening symptoms or concerns BP Readings from Last 3 Encounters:  12/13/17 124/82  11/07/17 124/86  08/02/17 116/82

## 2017-12-13 NOTE — Patient Instructions (Addendum)
Your cxr was done today  You will be contacted by phone if any changes need to be made immediately.  Otherwise, you will receive a letter about your results with an explanation, but please check with MyChart first.  Please take all new medication as prescribed - the antibiotic  Please continue all other medications as before  Please have the pharmacy call with any other refills you may need.  Please keep your appointments with your specialists as you may have planned

## 2017-12-13 NOTE — Progress Notes (Signed)
Subjective:    Patient ID: Oscar Dixon, male    DOB: 23-Nov-1945, 72 y.o.   MRN: [id number]  HPI  Here with acute onset mild to mod 2 wks ST, HA, general weakness and malaise, with prod cough greenish sputum, but Pt denies chest pain, increased sob or doe, wheezing, orthopnea, PND, increased LE swelling, palpitations, dizziness or syncope.   Pt denies polydipsia, polyuria, Pt denies new neurological symptoms such as new headache, or facial or extremity weakness or numbness     Past Medical History:  Diagnosis Date  . 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  . Arthritis   . CARPAL TUNNEL SYNDROME, BILATERAL 12/15/2009  . Cataract   . DYSPNEA ON EXERTION 12/15/2009  . Erectile dysfunction 04/12/2012  . FATIGUE 03/27/2010  . GERD 05/20/2007  . GOUT 03/27/2010  . HAND PAIN 12/15/2009  . Headache(784.0) 08/25/2009  . HERNIA, UMBILICAL 1/60/1093  . HYPERLIPIDEMIA 07/09/2008  . HYPERTENSION 05/20/2007  . LIBIDO, DECREASED 03/27/2010  . Mitral regurgitation 09/12/2011  . 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 CATARACT EXTRACTION     bilateral  . COLONOSCOPY  04-24-2005  . s/p ankle surgury    . s/p basal cell cancer    . s/p right elbow surugury      reports that he has quit smoking. he has never used smokeless tobacco. He reports that he drinks alcohol. He reports that he does not use drugs. family history includes Alcohol abuse in his mother; Cancer in his brother; Hypertension in his father and mother. Allergies  Allergen Reactions  . Augmentin [Amoxicillin-Pot Clavulanate] Diarrhea    diarrhea   Current Outpatient Medications on File Prior to Visit  Medication Sig Dispense Refill  . allopurinol (ZYLOPRIM) 100 MG tablet TAKE 1 TABLET DAILY 90 tablet 1  . amLODipine (NORVASC) 10 MG tablet Take 1 tablet (10 mg total) by mouth daily. 90 tablet 3  . aspirin 81 MG EC tablet Take 81 mg by mouth daily.      . benazepril (LOTENSIN) 20 MG tablet take 1  tablet by mouth once daily 90 tablet 0  . diclofenac sodium (VOLTAREN) 1 % GEL APPLY 4 GRAMS TOPICALLY FOUR TIMES A DAY AS NEEDED FOR PAIN 100 g 10  . esomeprazole (NEXIUM) 40 MG capsule take 1 capsule by mouth once daily at noon 90 capsule 0  . HYDROcodone-homatropine (HYCODAN) 5-1.5 MG/5ML syrup Take 5 mLs by mouth every 6 (six) hours as needed for up to 10 days for cough. 180 mL 0  . meclizine (ANTIVERT) 12.5 MG tablet Take 1 tablet (12.5 mg total) by mouth 3 (three) times daily as needed for dizziness. 30 tablet 1  . rosuvastatin (CRESTOR) 20 MG tablet take 1 tablet by mouth once daily 90 tablet 0  . rosuvastatin (CRESTOR) 20 MG tablet take 1 tablet by mouth once daily 90 tablet 0  . Vitamin D, Ergocalciferol, (DRISDOL) 50000 units CAPS capsule Take 1 capsule (50,000 Units total) by mouth every 7 (seven) days. 12 capsule 0  . zolpidem (AMBIEN) 5 MG tablet take 1 tablet by mouth at bedtime 90 tablet 1   No current facility-administered medications on file prior to visit.    Review of Systems  Constitutional: Negative for other unusual diaphoresis or sweats HENT: Negative for ear discharge or swelling Eyes: Negative for other worsening visual disturbances Respiratory: Negative for stridor or other swelling  Gastrointestinal: Negative for worsening distension or other blood Genitourinary: Negative for  retention or other urinary change Musculoskeletal: Negative for other MSK pain or swelling Skin: Negative for color change or other new lesions Neurological: Negative for worsening tremors and other numbness  Psychiatric/Behavioral: Negative for worsening agitation or other fatigue All other system neg per pt    Objective:   Physical Exam BP 124/82   Pulse 81   Temp 97.7 F (36.5 C) (Oral)   Ht 6\' 2"  (1.88 m)   Wt 206 lb (93.4 kg)   SpO2 96%   BMI 26.45 kg/m  VS noted, mild to mod ill Constitutional: Pt appears in NAD HENT: Head: NCAT.  Right Ear: External ear normal.  Left Ear:  External ear normal.  Eyes: . Pupils are equal, round, and reactive to light. Conjunctivae and EOM are normal Nose: without d/c or deformity Neck: Neck supple. Gross normal ROM Cardiovascular: Normal rate and regular rhythm.   Pulmonary/Chest: Effort normal and breath sounds without rales or wheezing. but with marked decreased RLL BS Neurological: Pt is alert. At baseline orientation, motor grossly intact Skin: Skin is warm. No rashes, other new lesions, no LE edema Psychiatric: Pt behavior is normal without agitation  No other exam findings    Assessment & Plan:

## 2018-01-15 ENCOUNTER — Encounter: Payer: Self-pay | Admitting: Internal Medicine

## 2018-01-17 ENCOUNTER — Encounter: Payer: Self-pay | Admitting: Physician Assistant

## 2018-01-17 ENCOUNTER — Ambulatory Visit (INDEPENDENT_AMBULATORY_CARE_PROVIDER_SITE_OTHER): Payer: Medicare Other | Admitting: Physician Assistant

## 2018-01-17 ENCOUNTER — Ambulatory Visit (INDEPENDENT_AMBULATORY_CARE_PROVIDER_SITE_OTHER): Payer: Medicare Other

## 2018-01-17 VITALS — BP 110/64 | HR 91 | Temp 98.5°F | Ht 74.0 in | Wt 204.5 lb

## 2018-01-17 DIAGNOSIS — J069 Acute upper respiratory infection, unspecified: Secondary | ICD-10-CM | POA: Diagnosis not present

## 2018-01-17 DIAGNOSIS — R0602 Shortness of breath: Secondary | ICD-10-CM | POA: Diagnosis not present

## 2018-01-17 MED ORDER — DOXYCYCLINE HYCLATE 100 MG PO TABS: 100.0000 mg | ORAL_TABLET | Freq: Two times a day (BID) | ORAL | 0 refills | Status: DC

## 2018-01-17 MED ORDER — GUAIFENESIN-CODEINE 100-10 MG/5ML PO SYRP: 5.0000 mL | ORAL_SOLUTION | Freq: Three times a day (TID) | ORAL | 0 refills | Status: DC | PRN

## 2018-01-17 MED ORDER — ALBUTEROL SULFATE HFA 108 (90 BASE) MCG/ACT IN AERS: 2.0000 | INHALATION_SPRAY | Freq: Four times a day (QID) | RESPIRATORY_TRACT | 0 refills | Status: DC | PRN

## 2018-01-17 MED ORDER — METHYLPREDNISOLONE 4 MG PO TBPK: ORAL_TABLET | ORAL | 0 refills | Status: DC

## 2018-01-17 NOTE — Progress Notes (Signed)
Oscar Dixon is a 72 y.o. male here for a new problem.  I acted as a Education administrator for [employer], PA-C Anselmo Pickler, LPN  History of Present Illness:   Chief Complaint  Patient presents with  . Cough    Cough  This is a new problem. Episode onset: started one week ago, was treated for pneumonia 3-4 weeks ago. The problem has been unchanged. The problem occurs every few minutes. Associated symptoms include ear congestion, ear pain, a fever (99.8 to 100) and shortness of breath. Pertinent negatives include no chest pain, heartburn, hemoptysis, myalgias, nasal congestion, postnasal drip or sore throat. Nothing aggravates the symptoms. Treatments tried:  Tylenol cough drops. The treatment provided moderate relief. His past medical history is significant for pneumonia. was treated on 2/8 with antibiotics   Patient was seen on 12/13/17 -- took levaquin as prescribed. CXR negative. Did feel like his symptoms improved, but cough has still lingered. Now having low-grade temps again. He is going to a concert tomorrow night and wants to feel improved to go. Appetite is reduced. Feels like he is pushing fluids well. Has tried Tylenol. Max temperature at home is 100.8.  Did receive the flu shot this year.  Past Medical History:  Diagnosis Date  . 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  . Arthritis   . CARPAL TUNNEL SYNDROME, BILATERAL 12/15/2009  . Cataract   . DYSPNEA ON EXERTION 12/15/2009  . Erectile dysfunction 04/12/2012  . FATIGUE 03/27/2010  . GERD 05/20/2007  . GOUT 03/27/2010  . HAND PAIN 12/15/2009  . Headache(784.0) 08/25/2009  . HERNIA, UMBILICAL 8/46/9629  . HYPERLIPIDEMIA 07/09/2008  . HYPERTENSION 05/20/2007  . LIBIDO, DECREASED 03/27/2010  . Mitral regurgitation 09/12/2011  . Other specified forms of hearing loss 06/30/2010     Social History   Socioeconomic History  . Marital status: Married    Spouse name: Not on file  . Number of children: 3  . Years of education: Not on file  . Highest education  level: Not on file  Social Needs  . Financial resource strain: Not on file  . Food insecurity - worry: Not on file  . Food insecurity - inability: Not on file  . Transportation needs - medical: Not on file  . Transportation needs - non-medical: Not on file  Occupational History  . Occupation: Museum/gallery curator   Tobacco Use  . Smoking status: Former Research scientist (life sciences)  . Smokeless tobacco: Never Used  Substance and Sexual Activity  . Alcohol use: Yes    Alcohol/week: 0.0 oz    Comment: socially  . Drug use: No  . Sexual activity: Not on file  Other Topics Concern  . Not on file  Social History Narrative  . Not on file    Past Surgical History:  Procedure Laterality Date  . CATARACT EXTRACTION     bilateral  . COLONOSCOPY  04-24-2005  . s/p ankle surgury    . s/p basal cell cancer    . s/p right elbow surugury      Family History  Problem Relation Age of Onset  . Hypertension Mother   . Alcohol abuse Mother        ETOH  . Hypertension Father   . Cancer Brother        Lung cancer    Allergies  Allergen Reactions  . Augmentin [Amoxicillin-Pot Clavulanate] Diarrhea    diarrhea    Current Medications:   Current Outpatient Medications:  .  amLODipine (NORVASC) 10 MG tablet, Take 1 tablet (10  mg total) by mouth daily., Disp: 90 tablet, Rfl: 3 .  aspirin 81 MG EC tablet, Take 81 mg by mouth daily.  , Disp: , Rfl:  .  benazepril (LOTENSIN) 20 MG tablet, take 1 tablet by mouth once daily, Disp: 90 tablet, Rfl: 0 .  diclofenac sodium (VOLTAREN) 1 % GEL, APPLY 4 GRAMS TOPICALLY FOUR TIMES A DAY AS NEEDED FOR PAIN, Disp: 100 g, Rfl: 10 .  esomeprazole (NEXIUM) 40 MG capsule, take 1 capsule by mouth once daily at noon, Disp: 90 capsule, Rfl: 0 .  meclizine (ANTIVERT) 12.5 MG tablet, Take 1 tablet (12.5 mg total) by mouth 3 (three) times daily as needed for dizziness., Disp: 30 tablet, Rfl: 1 .  rosuvastatin (CRESTOR) 20 MG tablet, take 1 tablet by mouth once daily, Disp: 90 tablet,  Rfl: 0 .  zolpidem (AMBIEN) 5 MG tablet, take 1 tablet by mouth at bedtime, Disp: 90 tablet, Rfl: 1 .  albuterol (PROVENTIL HFA;VENTOLIN HFA) 108 (90 Base) MCG/ACT inhaler, Inhale 2 puffs into the lungs every 6 (six) hours as needed for wheezing or shortness of breath., Disp: 1 Inhaler, Rfl: 0 .  doxycycline (VIBRA-TABS) 100 MG tablet, Take 1 tablet (100 mg total) by mouth 2 (two) times daily., Disp: 20 tablet, Rfl: 0 .  guaiFENesin-codeine (ROBITUSSIN AC) 100-10 MG/5ML syrup, Take 5 mLs by mouth 3 (three) times daily as needed for cough., Disp: 120 mL, Rfl: 0 .  methylPREDNISolone (MEDROL DOSEPAK) 4 MG TBPK tablet, 6-5-4-3-2-1, Disp: 21 tablet, Rfl: 0   Review of Systems:   Review of Systems  Constitutional: Positive for fever (99.8 to 100).  HENT: Positive for ear pain. Negative for postnasal drip and sore throat.   Respiratory: Positive for cough and shortness of breath. Negative for hemoptysis.   Cardiovascular: Negative for chest pain.  Gastrointestinal: Negative for heartburn.  Musculoskeletal: Negative for myalgias.    Vitals:   Vitals:   01/17/18 1613  BP: 110/64  Pulse: 91  Temp: 98.5 F (36.9 C)  TempSrc: Oral  SpO2: 95%  Weight: 204 lb 8 oz (92.8 kg)  Height: 6\' 2"  (1.88 m)     Body mass index is 26.26 kg/m.  Physical Exam:   Physical Exam  Constitutional: He appears well-developed. He is cooperative.  Non-toxic appearance. He does not have a sickly appearance. He does not appear ill. No distress.  HENT:  Head: Normocephalic and atraumatic.  Right Ear: Tympanic membrane, external ear and ear canal normal. Tympanic membrane is not erythematous, not retracted and not bulging.  Left Ear: Tympanic membrane, external ear and ear canal normal. Tympanic membrane is not erythematous, not retracted and not bulging.  Nose: Mucosal edema and rhinorrhea present. Right sinus exhibits no maxillary sinus tenderness and no frontal sinus tenderness. Left sinus exhibits no  maxillary sinus tenderness and no frontal sinus tenderness.  Mouth/Throat: Uvula is midline and mucous membranes are normal. No posterior oropharyngeal edema or posterior oropharyngeal erythema. Tonsils are 1+ on the right. Tonsils are 1+ on the left. No tonsillar exudate.  Eyes: Conjunctivae and lids are normal.  Neck: Trachea normal.  Cardiovascular: Normal rate, regular rhythm, S1 normal, S2 normal and normal heart sounds.  Pulmonary/Chest: Effort normal. He has decreased breath sounds in the right lower field and the left lower field. He has no wheezes. He has no rhonchi. He has no rales.  Lymphadenopathy:    He has no cervical adenopathy.  Neurological: He is alert.  Skin: Skin is warm, dry and intact.  Psychiatric: He has a normal mood and affect. His speech is normal and behavior is normal.  Nursing note and vitals reviewed.  CXR pending  Assessment and Plan:    [place] was seen today for cough.  Diagnoses and all orders for this visit:  Upper respiratory tract infection, unspecified type -     DG Chest 2 View; Future  Other orders -     methylPREDNISolone (MEDROL DOSEPAK) 4 MG TBPK tablet; 6-5-4-3-2-1 -     guaiFENesin-codeine (ROBITUSSIN AC) 100-10 MG/5ML syrup; Take 5 mLs by mouth 3 (three) times daily as needed for cough. -     albuterol (PROVENTIL HFA;VENTOLIN HFA) 108 (90 Base) MCG/ACT inhaler; Inhale 2 puffs into the lungs every 6 (six) hours as needed for wheezing or shortness of breath. -     doxycycline (VIBRA-TABS) 100 MG tablet; Take 1 tablet (100 mg total) by mouth 2 (two) times daily.   Suspect possible bronchitis. No red flags on exam.  CXR pending. Will initiate Medrol Dose Pack, Cheratussin and Inhaler prn per orders. I did provide "wait and see" antibiotic of Doxycycline for him to take if symptoms worsen or do not improve. Discussed taking medications as prescribed. Reviewed return precautions including worsening fever, SOB, worsening cough or other concerns.  Push fluids and rest. I recommend that patient follow-up if symptoms worsen or persist despite treatment x 7-10 days, sooner if needed.   . Reviewed expectations re: course of current medical issues. . Discussed self-management of symptoms. . Outlined signs and symptoms indicating need for more acute intervention. . Patient verbalized understanding and all questions were answered. . See orders for this visit as documented in the electronic medical record. . Patient received an After-Visit Summary.  CMA or LPN served as scribe during this visit. History, Physical, and Plan performed by medical provider. Documentation and orders reviewed and attested to.  Inda Coke, PA-C

## 2018-01-17 NOTE — Patient Instructions (Signed)
It was great to see you!  You have an upper respiratory infection, remember that the cough can last a few weeks to go away.  Use medication as prescribed: Medrol (steroid) dose pack, Inhaler as needed for shortness of breath episodes (can use every 4-6 hours), and Cheratussin cough syrup (only use when at home, may make you drowsy)  If symptoms continue to not improve, start the antibiotic that I have sent in (doxycycline) -- take with food and complete entire course.  Push fluids and get plenty of rest. Please return if you are not improving as expected, or if you have high fevers (>101.5) or difficulty swallowing or worsening productive cough.  Call clinic with questions.  I hope you start feeling better soon!

## 2018-01-20 ENCOUNTER — Ambulatory Visit: Payer: Medicare Other | Admitting: Family

## 2018-01-20 ENCOUNTER — Ambulatory Visit: Payer: Medicare Other | Admitting: Internal Medicine

## 2018-02-08 ENCOUNTER — Other Ambulatory Visit: Payer: Self-pay | Admitting: Physician Assistant

## 2018-02-18 ENCOUNTER — Other Ambulatory Visit: Payer: Self-pay | Admitting: Internal Medicine

## 2018-02-20 ENCOUNTER — Encounter: Payer: Self-pay | Admitting: Internal Medicine

## 2018-02-20 DIAGNOSIS — R29818 Other symptoms and signs involving the nervous system: Secondary | ICD-10-CM

## 2018-02-20 DIAGNOSIS — R42 Dizziness and giddiness: Secondary | ICD-10-CM

## 2018-02-25 ENCOUNTER — Ambulatory Visit (INDEPENDENT_AMBULATORY_CARE_PROVIDER_SITE_OTHER): Payer: Medicare Other | Admitting: Neurology

## 2018-02-25 ENCOUNTER — Other Ambulatory Visit: Payer: Self-pay

## 2018-02-25 ENCOUNTER — Encounter: Payer: Self-pay | Admitting: Neurology

## 2018-02-25 VITALS — BP 136/85 | HR 64 | Ht 74.0 in | Wt 206.5 lb

## 2018-02-25 DIAGNOSIS — R42 Dizziness and giddiness: Secondary | ICD-10-CM | POA: Diagnosis not present

## 2018-02-25 NOTE — Progress Notes (Signed)
Reason for visit: Dizziness  Referring physician: Dr. Larene Beach is a 72 y.o. male  History of present illness:  Oscar Dixon is a 72 year old right-handed white male with a history of dizziness that began in the fall 2018.  The patient has had some persistence of symptoms since that time, he may have good days and bad days with the dizziness, over the last 2 weeks the dizziness has worsened slightly.  He reports no true vertigo, he has a lightheaded sensation that may worsen with any particular head movement.  Looking up, looking down, stooping, rolling over in bed may bring on the dizziness, if he lays still the dizziness improves.  He denies any significant nausea or vomiting, occasionally he may have slight queasiness with the sensation.  He does report some gait instability when the dizziness is severe, usually his walking is normal.  He reports a full sensation in the ears bilaterally, some tinnitus and decreased hearing have been noted.  He denies any sinus drainage.  He has reported no numbness or weakness of the face, arms, or legs.  He denies any double vision or loss of vision or changes in visual field.  He reports no difficulty with speech or swallowing.  The patient denies headaches or neck discomfort.  He denies issues controlling the bowels or the bladder.  The patient has taken meclizine for the dizziness without any benefit.  MRI of the brain has been ordered, this has not yet been done.  He is sent to this office for an evaluation.  The patient denies any new medications that were added or taken away around the time of onset of dizziness.  Past Medical History:  Diagnosis Date  . 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  . Arthritis   . CARPAL TUNNEL SYNDROME, BILATERAL 12/15/2009  . Cataract   . DYSPNEA ON EXERTION 12/15/2009  . Erectile dysfunction 04/12/2012  . FATIGUE 03/27/2010  . GERD 05/20/2007  . GOUT 03/27/2010  . HAND PAIN 12/15/2009  . Headache(784.0) 08/25/2009  . HERNIA,  UMBILICAL 2/70/6237  . HYPERLIPIDEMIA 07/09/2008  . HYPERTENSION 05/20/2007  . LIBIDO, DECREASED 03/27/2010  . Mitral regurgitation 09/12/2011  . Other specified forms of hearing loss 06/30/2010    Past Surgical History:  Procedure Laterality Date  . CATARACT EXTRACTION     bilateral  . COLONOSCOPY  04-24-2005  . s/p ankle surgury    . s/p basal cell cancer    . s/p right elbow surugury      Family History  Problem Relation Age of Onset  . Hypertension Mother   . Alcohol abuse Mother        ETOH  . Hypertension Father   . Cancer Brother        Lung cancer    Social history:  reports that he has quit smoking. He has never used smokeless tobacco. He reports that he drinks alcohol. He reports that he does not use drugs.  Medications:  Prior to Admission medications   Medication Sig Start Date End Date Taking? Authorizing Provider  albuterol (PROVENTIL HFA;VENTOLIN HFA) 108 (90 Base) MCG/ACT inhaler Inhale 2 puffs into the lungs every 6 (six) hours as needed for wheezing or shortness of breath. 01/17/18  Yes Inda Coke, PA  amLODipine (NORVASC) 10 MG tablet Take 1 tablet (10 mg total) by mouth daily. 04/25/17  Yes Biagio Borg, MD  aspirin 81 MG EC tablet Take 81 mg by mouth daily.     Yes [provider]  benazepril (LOTENSIN) 20 MG tablet TAKE 1 TABLET BY MOUTH ONCE DAILY 02/18/18  Yes Biagio Borg, MD  diclofenac sodium (VOLTAREN) 1 % GEL APPLY 4 GRAMS TOPICALLY FOUR TIMES A DAY AS NEEDED FOR PAIN 12/12/15  Yes Biagio Borg, MD  doxycycline (VIBRA-TABS) 100 MG tablet Take 1 tablet (100 mg total) by mouth 2 (two) times daily. 01/17/18  Yes Morene Rankins, Aldona Bar, PA  esomeprazole (NEXIUM) 40 MG capsule TAKE 1 CAPSULE BY MOUTH ONCE DAILY AT NOON 02/18/18  Yes Biagio Borg, MD  guaiFENesin-codeine [facility]) 100-10 MG/5ML syrup Take 5 mLs by mouth 3 (three) times daily as needed for cough. 01/17/18  Yes Inda Coke, PA  meclizine (ANTIVERT) 12.5 MG tablet Take 1 tablet  (12.5 mg total) by mouth 3 (three) times daily as needed for dizziness. 09/30/17 09/30/18 Yes Biagio Borg, MD  methylPREDNISolone (MEDROL DOSEPAK) 4 MG TBPK tablet 6-5-4-3-2-1 01/17/18  Yes Inda Coke, PA  rosuvastatin (CRESTOR) 20 MG tablet take 1 tablet by mouth once daily 09/09/17  Yes Biagio Borg, MD  zolpidem Oscar Dixon) 5 MG tablet take 1 tablet by mouth at bedtime 10/16/17  Yes Biagio Borg, MD      Allergies  Allergen Reactions  . Augmentin [Amoxicillin-Pot Clavulanate] Diarrhea    diarrhea    ROS:  Out of a complete 14 system review of symptoms, the patient complains only of the following symptoms, and all other reviewed systems are negative.  Ringing in the ears, dizziness  Blood pressure 136/85, pulse 64, height 6\' 2"  (1.88 m), weight 206 lb 8 oz (93.7 kg).  Physical Exam  General: The patient is alert and cooperative at the time of the examination.  Eyes: Pupils are equal, round, and reactive to light. Discs are flat bilaterally.  Neck: The neck is supple, no carotid bruits are noted.  Respiratory: The respiratory examination is clear.  Cardiovascular: The cardiovascular examination reveals a regular rate and rhythm, no obvious murmurs or rubs are noted.  Skin: Extremities are without significant edema.  The left thumb and thenar eminence are smaller on the left than the right.  Neurologic Exam  Mental status: The patient is alert and oriented x 3 at the time of the examination. The patient has apparent normal recent and remote memory, with an apparently normal attention span and concentration ability.  Cranial nerves: Facial symmetry is present. There is good sensation of the face to pinprick and soft touch bilaterally. The strength of the facial muscles and the muscles to head turning and shoulder shrug are normal bilaterally. Speech is well enunciated, no aphasia or dysarthria is noted. Extraocular movements are full. Visual fields are full. The tongue is  midline, and the patient has symmetric elevation of the soft palate. No obvious hearing deficits are noted.  Motor: The motor testing reveals 5 over 5 strength of all 4 extremities. Good symmetric motor tone is noted throughout.  Sensory: Sensory testing is intact to pinprick, soft touch, vibration sensation, and position sense on all 4 extremities. No evidence of extinction is noted.  Coordination: Cerebellar testing reveals good finger-nose-finger and heel-to-shin bilaterally. The Nyan-Barrany procedure reveals bilaterally symmetric and gaze nystagmus associated with a horizontal and rotatory component, the phase of the nystagmus versus when looking to the left and when looking to the right.  The patient himself reports no subjective dizziness with the procedure.  Gait and station: Gait is normal. Tandem gait is normal. Romberg is negative. No drift is seen.  Reflexes: Deep tendon  reflexes are symmetric and normal bilaterally, with exception that the left biceps reflex is more brisk than the right. Toes are downgoing bilaterally.   Assessment/Plan:  1.  Persistent dizziness  The patient has a nonspecific complaint of dizziness, no true vertigo is noted.  The dizziness is not consistent with an inner ear issue.  The patient has bilateral end gaze nystagmus that reverses phase consistent with a central nervous system process.  The patient does have reflex asymmetry in the upper extremities.  MRI of the brain has been ordered, this is reasonable to perform at this time.  I will try to review the study once it is done.  If the study does not reveal an etiology for the dizziness, an ENT evaluation may be helpful.   Jill Alexanders MD 02/25/2018 9:58 AM  [facility] [address] [place], [place] [address]  Phone [phone] Fax [phone]

## 2018-02-25 NOTE — Patient Instructions (Signed)
   MRI of the brain is to be done.

## 2018-03-05 ENCOUNTER — Ambulatory Visit
Admission: RE | Admit: 2018-03-05 | Discharge: 2018-03-05 | Disposition: A | Discharge status: Home / Self Care | Payer: Medicare Other | Source: Ambulatory Visit | Attending: Internal Medicine | Admitting: Internal Medicine

## 2018-03-05 DIAGNOSIS — R42 Dizziness and giddiness: Secondary | ICD-10-CM | POA: Diagnosis not present

## 2018-03-05 DIAGNOSIS — R29818 Other symptoms and signs involving the nervous system: Secondary | ICD-10-CM

## 2018-03-06 ENCOUNTER — Telehealth: Payer: Self-pay | Admitting: Neurology

## 2018-03-06 NOTE — Telephone Encounter (Signed)
  I called the patient.  MRI of the brain does not show an etiology of his chronic dizziness.  May consider given a trial on low-dose diazepam to see if this helps the symptoms, may consider ENT consultation.  He is to call back and let me know if he is willing to try the diazepam.   MRI brain 03/05/18:  IMPRESSION: No significant intracranial abnormality.  Incidental cisterna magna  Mild mucosal edema paranasal sinuses

## 2018-03-21 ENCOUNTER — Other Ambulatory Visit: Payer: Self-pay | Admitting: Internal Medicine

## 2018-03-21 MED ORDER — ROSUVASTATIN CALCIUM 20 MG PO TABS: 20.0000 mg | ORAL_TABLET | Freq: Every day | ORAL | 1 refills | Status: DC

## 2018-04-03 ENCOUNTER — Encounter: Payer: Self-pay | Admitting: Internal Medicine

## 2018-04-04 ENCOUNTER — Other Ambulatory Visit (INDEPENDENT_AMBULATORY_CARE_PROVIDER_SITE_OTHER): Payer: Medicare Other

## 2018-04-04 DIAGNOSIS — R972 Elevated prostate specific antigen [PSA]: Secondary | ICD-10-CM | POA: Diagnosis not present

## 2018-04-04 LAB — PSA: PSA: 3.15 ng/mL (ref 0.10–4.00)

## 2018-04-14 ENCOUNTER — Other Ambulatory Visit: Payer: Self-pay

## 2018-04-14 MED ORDER — AMLODIPINE BESYLATE 10 MG PO TABS: 10.0000 mg | ORAL_TABLET | Freq: Every day | ORAL | 1 refills | Status: DC

## 2018-04-15 ENCOUNTER — Other Ambulatory Visit: Payer: Self-pay | Admitting: Physician Assistant

## 2018-04-15 ENCOUNTER — Other Ambulatory Visit: Payer: Self-pay | Admitting: Internal Medicine

## 2018-04-15 MED ORDER — ALBUTEROL SULFATE HFA 108 (90 BASE) MCG/ACT IN AERS: 2.0000 | INHALATION_SPRAY | Freq: Four times a day (QID) | RESPIRATORY_TRACT | 0 refills | Status: DC | PRN

## 2018-04-15 NOTE — Telephone Encounter (Signed)
Done erx 

## 2018-04-15 NOTE — Telephone Encounter (Signed)
01/14/2018 90# 

## 2018-05-08 ENCOUNTER — Other Ambulatory Visit: Payer: Self-pay | Admitting: Internal Medicine

## 2018-08-05 DIAGNOSIS — Z23 Encounter for immunization: Secondary | ICD-10-CM | POA: Diagnosis not present

## 2018-09-02 ENCOUNTER — Encounter: Payer: Self-pay | Admitting: Internal Medicine

## 2018-09-02 DIAGNOSIS — R42 Dizziness and giddiness: Secondary | ICD-10-CM

## 2018-09-03 NOTE — Addendum Note (Signed)
Addended by: Biagio Borg on: 09/03/2018 04:06 AM   Modules accepted: Orders

## 2018-09-09 ENCOUNTER — Ambulatory Visit (INDEPENDENT_AMBULATORY_CARE_PROVIDER_SITE_OTHER): Payer: Medicare Other | Admitting: Family

## 2018-09-09 ENCOUNTER — Encounter: Payer: Self-pay | Admitting: Family

## 2018-09-09 VITALS — BP 124/78 | HR 92 | Temp 98.3°F | Ht 74.0 in | Wt 205.6 lb

## 2018-09-09 DIAGNOSIS — J019 Acute sinusitis, unspecified: Secondary | ICD-10-CM | POA: Diagnosis not present

## 2018-09-09 MED ORDER — DOXYCYCLINE HYCLATE 100 MG PO TABS: 100.0000 mg | ORAL_TABLET | Freq: Two times a day (BID) | ORAL | 0 refills | Status: DC

## 2018-09-09 MED ORDER — FLUTICASONE PROPIONATE 50 MCG/ACT NA SUSP: 2.0000 | Freq: Every day | NASAL | 6 refills | Status: DC

## 2018-09-09 MED ORDER — GUAIFENESIN-CODEINE 100-10 MG/5ML PO SYRP: 5.0000 mL | ORAL_SOLUTION | Freq: Three times a day (TID) | ORAL | 0 refills | Status: DC | PRN

## 2018-09-09 NOTE — Progress Notes (Signed)
Oscar Dixon is a 72 y.o. male with the following history as recorded in [employer]:  Patient Active Problem List   Diagnosis Date Noted  . Cough 12/13/2017  . Plantar fasciitis of left foot 08/02/2017  . Fever 01/31/2017  . Allergic conjunctivitis 10/02/2016  . Hyperglycemia 10/02/2016  . Scapular dyskinesis 06/08/2016  . Nonallopathic lesion of thoracic region 06/08/2016  . Nonallopathic lesion of rib cage 06/08/2016  . Nonallopathic lesion of lumbosacral region 06/08/2016  . 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 Insomnia 09/22/2014  . Bilateral hearing loss 09/22/2014  . Eustachian tube dysfunction 10/27/2013  . Acute sinus infection 10/07/2013  . Erectile dysfunction 04/12/2012  . 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 Left thyroid nodule 11/28/2011  . Mitral regurgitation 09/12/2011  . Preventative health care 09/07/2011  . 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 GOUT 03/27/2010  . FATIGUE 03/27/2010  . LIBIDO, DECREASED 03/27/2010  . CARPAL TUNNEL SYNDROME, BILATERAL 12/15/2009  . HERNIA, UMBILICAL 85/12/7739  . HAND PAIN 12/15/2009  . Headache(784.0) 08/25/2009  . Hyperlipidemia 07/09/2008  . 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  . Essential hypertension 05/20/2007  . GERD 05/20/2007    Current Outpatient Medications  Medication Sig Dispense Refill  . albuterol (PROVENTIL HFA;VENTOLIN HFA) 108 (90 Base) MCG/ACT inhaler INHALE 2 PUFFS INTO THE LUNGS EVERY 6 HOURS AS NEEDED FOR WHEEZING OR SHORTNESS OF BREATH 6.7 g 2  . amLODipine (NORVASC) 10 MG tablet Take 1 tablet (10 mg total) by mouth daily. 90 tablet 1  . aspirin 81 MG EC tablet Take 81 mg by mouth daily.      . benazepril (LOTENSIN) 20 MG tablet TAKE 1 TABLET BY MOUTH ONCE DAILY 90 tablet 3  . diclofenac sodium (VOLTAREN) 1 % GEL APPLY 4 GRAMS TOPICALLY FOUR TIMES A DAY AS NEEDED FOR PAIN 100 g 10  . esomeprazole (NEXIUM) 40 MG capsule TAKE 1 CAPSULE BY MOUTH ONCE DAILY AT NOON 90 capsule 3  . FLUAD 0.5 ML  SUSY ADM 0.5ML IM UTD  0  . meclizine (ANTIVERT) 12.5 MG tablet Take 1 tablet (12.5 mg total) by mouth 3 (three) times daily as needed for dizziness. 30 tablet 1  . rosuvastatin (CRESTOR) 20 MG tablet Take 1 tablet (20 mg total) by mouth daily. 90 tablet 1  . zolpidem (AMBIEN) 5 MG tablet TAKE 1 TABLET BY MOUTH AT BEDTIME FOR SLEEP 90 tablet 1  . doxycycline (VIBRA-TABS) 100 MG tablet Take 1 tablet (100 mg total) by mouth 2 (two) times daily. 20 tablet 0  . fluticasone (FLONASE) 50 MCG/ACT nasal spray Place 2 sprays into both nostrils daily. 16 g 6  . guaiFENesin-codeine (ROBITUSSIN AC) 100-10 MG/5ML syrup Take 5 mLs by mouth 3 (three) times daily as needed for cough. 120 mL 0   No current facility-administered medications for this visit.     Allergies: Augmentin [amoxicillin-pot clavulanate]  Past Medical History:  Diagnosis Date  . 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  . Arthritis   . CARPAL TUNNEL SYNDROME, BILATERAL 12/15/2009  . Cataract   . DYSPNEA ON EXERTION 12/15/2009  . Erectile dysfunction 04/12/2012  . FATIGUE 03/27/2010  . GERD 05/20/2007  . GOUT 03/27/2010  . HAND PAIN 12/15/2009  . Headache(784.0) 08/25/2009  . HERNIA, UMBILICAL 2/87/8676  . HYPERLIPIDEMIA 07/09/2008  . HYPERTENSION 05/20/2007  . LIBIDO, DECREASED 03/27/2010  . Mitral regurgitation 09/12/2011  . Other specified forms of hearing loss 06/30/2010    Past Surgical History:  Procedure Laterality Date  . CATARACT EXTRACTION  bilateral  . COLONOSCOPY  04-24-2005  . s/p ankle surgury    . s/p basal cell cancer    . s/p right elbow surugury      Family History  Problem Relation Age of Onset  . Hypertension Mother   . Alcohol abuse Mother        ETOH  . Hypertension Father   . Cancer Brother        Lung cancer    Social History   Tobacco Use  . Smoking status: Former Research scientist (life sciences)  . Smokeless tobacco: Never Used  Substance Use Topics  . Alcohol use: Yes    Alcohol/week: 0.0 standard drinks    Comment: socially    Subjective:   Patient presents with concerns for 1 week history of cough/ congestion;+ fever; no chest pain, no shortness of breath; painful with cough; using OTC Tylenol to help with fever and OTC Mucinex; + ears feel full; + cough keeping awake at night;      Objective:  Vitals:   09/09/18 0820  BP: 124/78  Pulse: 92  Temp: 98.3 F (36.8 C)  TempSrc: Oral  SpO2: 96%  Weight: 205 lb 9.6 oz (93.3 kg)  Height: 6\' 2"  (1.88 m)    General: Well developed, well nourished, in no acute distress  Skin : Warm and dry.  Head: Normocephalic and atraumatic  Eyes: Sclera and conjunctiva clear; pupils round and reactive to light; extraocular movements intact  Ears: External normal; canals clear; tympanic membranes congested Oropharynx: Pink, supple. No suspicious lesions  Neck: Supple without thyromegaly, adenopathy  Lungs: Respirations unlabored; clear to auscultation bilaterally without wheeze, rales, rhonchi  CVS exam: normal rate and regular rhythm.  Neurologic: Alert and oriented; speech intact; face symmetrical; moves all extremities well; CNII-XII intact without focal deficit   Assessment:  1. Acute sinusitis, recurrence not specified, unspecified location     Plan:  Rx for Doxycycline 100 mg bid x 10 days, Flonase and Robitussin AC; increase fluids, rest and follow-up worse, no better.   No follow-ups on file.  No orders of the defined types were placed in this encounter.   Requested Prescriptions   Signed Prescriptions Disp Refills  . doxycycline (VIBRA-TABS) 100 MG tablet 20 tablet 0    Sig: Take 1 tablet (100 mg total) by mouth 2 (two) times daily.  . fluticasone (FLONASE) 50 MCG/ACT nasal spray 16 g 6    Sig: Place 2 sprays into both nostrils daily.  Marland Kitchen guaiFENesin-codeine (ROBITUSSIN AC) 100-10 MG/5ML syrup 120 mL 0    Sig: Take 5 mLs by mouth 3 (three) times daily as needed for cough.

## 2018-09-16 ENCOUNTER — Other Ambulatory Visit: Payer: Self-pay | Admitting: Internal Medicine

## 2018-09-24 DIAGNOSIS — R42 Dizziness and giddiness: Secondary | ICD-10-CM | POA: Diagnosis not present

## 2018-09-24 DIAGNOSIS — H9042 Sensorineural hearing loss, unilateral, left ear, with unrestricted hearing on the contralateral side: Secondary | ICD-10-CM | POA: Diagnosis not present

## 2018-09-24 DIAGNOSIS — H8110 Benign paroxysmal vertigo, unspecified ear: Secondary | ICD-10-CM | POA: Diagnosis not present

## 2018-09-24 DIAGNOSIS — H8112 Benign paroxysmal vertigo, left ear: Secondary | ICD-10-CM | POA: Diagnosis not present

## 2018-10-06 ENCOUNTER — Other Ambulatory Visit: Payer: Self-pay | Admitting: Internal Medicine

## 2018-10-06 NOTE — Telephone Encounter (Signed)
Copied from [place] [id number]. Topic: Quick Communication - Rx Refill/Question >> Oct 06, 2018  2:57 PM Burchel, Abbi R wrote: Medication: rosuvastatin (CRESTOR) 20 MG tablet  Has the patient contacted their pharmacy? Yes.   (Agent: If no, request that the patient contact the pharmacy for the refill.) (Agent: If yes, when and what did the pharmacy advise?)  Preferred Pharmacy: [facility] [place], [address] [place] [address] Phone: [phone] Fax: [phone]    Agent: Please be advised that RX refills may take up to 3 business days. We ask that you follow-up with your pharmacy.

## 2018-10-07 ENCOUNTER — Encounter: Payer: Self-pay | Admitting: Internal Medicine

## 2018-10-07 ENCOUNTER — Other Ambulatory Visit: Payer: Self-pay | Admitting: Internal Medicine

## 2018-10-07 MED ORDER — ROSUVASTATIN CALCIUM 20 MG PO TABS: 20.0000 mg | ORAL_TABLET | Freq: Every day | ORAL | 0 refills | Status: DC

## 2018-10-07 NOTE — Telephone Encounter (Signed)
Pt sent mychart msg concerning refill. Per office policy sent 30 day to local pharmacy until appt...Oscar Dixon

## 2018-10-09 ENCOUNTER — Other Ambulatory Visit: Payer: Self-pay

## 2018-10-09 MED ORDER — ALBUTEROL SULFATE HFA 108 (90 BASE) MCG/ACT IN AERS: INHALATION_SPRAY | RESPIRATORY_TRACT | 0 refills | Status: DC

## 2018-10-13 ENCOUNTER — Encounter: Payer: Self-pay | Admitting: Internal Medicine

## 2018-10-13 ENCOUNTER — Other Ambulatory Visit: Payer: Self-pay | Admitting: Internal Medicine

## 2018-10-13 MED ORDER — ZOLPIDEM TARTRATE 5 MG PO TABS: ORAL_TABLET | ORAL | 1 refills | Status: DC

## 2018-10-21 ENCOUNTER — Encounter: Payer: Self-pay | Admitting: Internal Medicine

## 2018-10-21 MED ORDER — AMLODIPINE BESYLATE 10 MG PO TABS: 10.0000 mg | ORAL_TABLET | Freq: Every day | ORAL | 1 refills | Status: DC

## 2018-10-22 ENCOUNTER — Other Ambulatory Visit: Payer: Self-pay | Admitting: Family

## 2018-10-22 MED ORDER — GUAIFENESIN-CODEINE 100-10 MG/5ML PO SYRP: 5.0000 mL | ORAL_SOLUTION | Freq: Three times a day (TID) | ORAL | 0 refills | Status: DC | PRN

## 2018-11-05 ENCOUNTER — Other Ambulatory Visit: Payer: Self-pay | Admitting: Internal Medicine

## 2018-11-08 ENCOUNTER — Other Ambulatory Visit: Payer: Self-pay | Admitting: Internal Medicine

## 2018-11-12 ENCOUNTER — Other Ambulatory Visit (INDEPENDENT_AMBULATORY_CARE_PROVIDER_SITE_OTHER): Payer: Medicare Other

## 2018-11-12 ENCOUNTER — Encounter: Payer: Self-pay | Admitting: Internal Medicine

## 2018-11-12 ENCOUNTER — Other Ambulatory Visit: Payer: Self-pay | Admitting: Internal Medicine

## 2018-11-12 ENCOUNTER — Ambulatory Visit (INDEPENDENT_AMBULATORY_CARE_PROVIDER_SITE_OTHER): Payer: Medicare Other | Admitting: Internal Medicine

## 2018-11-12 ENCOUNTER — Telehealth: Payer: Self-pay

## 2018-11-12 VITALS — BP 124/76 | HR 79 | Temp 97.8°F | Ht 74.0 in | Wt 201.0 lb

## 2018-11-12 DIAGNOSIS — E785 Hyperlipidemia, unspecified: Secondary | ICD-10-CM

## 2018-11-12 DIAGNOSIS — R739 Hyperglycemia, unspecified: Secondary | ICD-10-CM

## 2018-11-12 DIAGNOSIS — I10 Essential (primary) hypertension: Secondary | ICD-10-CM

## 2018-11-12 DIAGNOSIS — R972 Elevated prostate specific antigen [PSA]: Secondary | ICD-10-CM

## 2018-11-12 DIAGNOSIS — Z0000 Encounter for general adult medical examination without abnormal findings: Secondary | ICD-10-CM | POA: Diagnosis not present

## 2018-11-12 LAB — HEMOGLOBIN A1C: Hgb A1c MFr Bld: 6.1 % (ref 4.6–6.5)

## 2018-11-12 LAB — URINALYSIS, ROUTINE W REFLEX MICROSCOPIC
Bilirubin Urine: NEGATIVE
Hgb urine dipstick: NEGATIVE
Ketones, ur: NEGATIVE
Leukocytes, UA: NEGATIVE
Nitrite: NEGATIVE
RBC / HPF: NONE SEEN (ref 0–?)
SPECIFIC GRAVITY, URINE: 1.015 (ref 1.000–1.030)
TOTAL PROTEIN, URINE-UPE24: NEGATIVE
URINE GLUCOSE: NEGATIVE
Urobilinogen, UA: 0.2 (ref 0.0–1.0)
pH: 7 (ref 5.0–8.0)

## 2018-11-12 LAB — LIPID PANEL
CHOLESTEROL: 133 mg/dL (ref 0–200)
HDL: 59.6 mg/dL (ref >39.00)
LDL Cholesterol: 62 mg/dL (ref 0–99)
NonHDL: 73.03
TRIGLYCERIDES: 56 mg/dL (ref 0.0–149.0)
Total CHOL/HDL Ratio: 2
VLDL: 11.2 mg/dL (ref 0.0–40.0)

## 2018-11-12 LAB — CBC WITH DIFFERENTIAL/PLATELET
BASOS ABS: 0.1 10*3/uL (ref 0.0–0.1)
Basophils Relative: 0.9 % (ref 0.0–3.0)
EOS PCT: 5.9 % — AB (ref 0.0–5.0)
Eosinophils Absolute: 0.4 10*3/uL (ref 0.0–0.7)
HEMATOCRIT: 42.2 % (ref 39.0–52.0)
Hemoglobin: 14 g/dL (ref 13.0–17.0)
LYMPHS PCT: 28.1 % (ref 12.0–46.0)
Lymphs Abs: 1.7 10*3/uL (ref 0.7–4.0)
MCHC: 33.2 g/dL (ref 30.0–36.0)
MCV: 86.1 fl (ref 78.0–100.0)
MONOS PCT: 7.2 % (ref 3.0–12.0)
Monocytes Absolute: 0.4 10*3/uL (ref 0.1–1.0)
NEUTROS ABS: 3.6 10*3/uL (ref 1.4–7.7)
Neutrophils Relative %: 57.9 % (ref 43.0–77.0)
Platelets: 203 10*3/uL (ref 150.0–400.0)
RBC: 4.91 Mil/uL (ref 4.22–5.81)
RDW: 14.3 % (ref 11.5–15.5)
WBC: 6.2 10*3/uL (ref 4.0–10.5)

## 2018-11-12 LAB — BASIC METABOLIC PANEL
BUN: 20 mg/dL (ref 6–23)
CALCIUM: 9.7 mg/dL (ref 8.4–10.5)
CO2: 30 mEq/L (ref 19–32)
CREATININE: 1.16 mg/dL (ref 0.40–1.50)
Chloride: 104 mEq/L (ref 96–112)
GFR: 66.13 mL/min (ref >60.00)
Glucose, Bld: 123 mg/dL — ABNORMAL HIGH (ref 70–99)
Potassium: 4.4 mEq/L (ref 3.5–5.1)
SODIUM: 141 meq/L (ref 135–145)

## 2018-11-12 LAB — HEPATIC FUNCTION PANEL
ALK PHOS: 36 U/L — AB (ref 39–117)
ALT: 17 U/L (ref 0–53)
AST: 21 U/L (ref 0–37)
Albumin: 4.3 g/dL (ref 3.5–5.2)
Bilirubin, Direct: 0.2 mg/dL (ref 0.0–0.3)
TOTAL PROTEIN: 6.9 g/dL (ref 6.0–8.3)
Total Bilirubin: 0.8 mg/dL (ref 0.2–1.2)

## 2018-11-12 LAB — TSH: TSH: 1.13 u[IU]/mL (ref 0.35–4.50)

## 2018-11-12 LAB — PSA: PSA: 5.04 ng/mL — AB (ref 0.10–4.00)

## 2018-11-12 NOTE — Progress Notes (Signed)
Subjective:    Patient ID: Oscar Dixon, male    DOB: [birth year], 73 y.o.   MRN: [id number]  HPI Here for yearly f/u;  Overall doing ok;  Pt denies Chest pain, worsening SOB, DOE, wheezing, orthopnea, PND, worsening LE edema, palpitations, dizziness or syncope.  Pt denies neurological change such as new headache, facial or extremity weakness.  Pt denies polydipsia, polyuria, or low sugar symptoms. Pt states overall good compliance with treatment and medications, good tolerability, and has been trying to follow appropriate diet.  Pt denies worsening depressive symptoms, suicidal ideation or panic. No fever, night sweats, wt loss, loss of appetite, or other constitutional symptoms.  Pt states good ability with ADL's, has low fall risk, home safety reviewed and adequate, no other significant changes in hearing or vision, and only occasionally active with exercise.  Son in law is ETOH and causing some issues with the family.      Past Medical History:  Diagnosis Date  . 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  . Arthritis   . CARPAL TUNNEL SYNDROME, BILATERAL 12/15/2009  . Cataract   . DYSPNEA ON EXERTION 12/15/2009  . Erectile dysfunction 04/12/2012  . FATIGUE 03/27/2010  . GERD 05/20/2007  . GOUT 03/27/2010  . HAND PAIN 12/15/2009  . Headache(784.0) 08/25/2009  . HERNIA, UMBILICAL 02/11/8118  . HYPERLIPIDEMIA 07/09/2008  . HYPERTENSION 05/20/2007  . LIBIDO, DECREASED 03/27/2010  . Mitral regurgitation 09/12/2011  . 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 CATARACT EXTRACTION     bilateral  . COLONOSCOPY  04-24-2005  . s/p ankle surgury    . s/p basal cell cancer    . s/p right elbow surugury      reports that he has quit smoking. He has never used smokeless tobacco. He reports current alcohol use. He reports that he does not use drugs. family history includes Alcohol abuse in his mother; Cancer in his brother; Hypertension in his father and  mother. Allergies  Allergen Reactions  . Augmentin [Amoxicillin-Pot Clavulanate] Diarrhea    diarrhea   Current Outpatient Medications on File Prior to Visit  Medication Sig Dispense Refill  . albuterol (PROVENTIL HFA;VENTOLIN HFA) 108 (90 Base) MCG/ACT inhaler INHALE 2 PUFFS INTO THE LUNGS EVERY 6 HOURS AS NEEDED FOR WHEEZING OR SHORTNESS OF BREATH 6.7 g 0  . amLODipine (NORVASC) 10 MG tablet Take 1 tablet (10 mg total) by mouth daily. 90 tablet 1  . aspirin 81 MG EC tablet Take 81 mg by mouth daily.      . benazepril (LOTENSIN) 20 MG tablet TAKE 1 TABLET BY MOUTH ONCE DAILY 90 tablet 0  . diclofenac sodium (VOLTAREN) 1 % GEL APPLY 4 GRAMS TOPICALLY FOUR TIMES A DAY AS NEEDED FOR PAIN 100 g 10  . doxycycline (VIBRA-TABS) 100 MG tablet Take 1 tablet (100 mg total) by mouth 2 (two) times daily. 20 tablet 0  . esomeprazole (NEXIUM) 40 MG capsule TAKE 1 CAPSULE BY MOUTH ONCE DAILY AT NOON 90 capsule 3  . FLUAD 0.5 ML SUSY ADM 0.5ML IM UTD  0  . fluticasone (FLONASE) 50 MCG/ACT nasal spray Place 2 sprays into both nostrils daily. 16 g 6  . guaiFENesin-codeine (ROBITUSSIN AC) 100-10 MG/5ML syrup Take 5 mLs by mouth 3 (three) times daily as needed for cough. 120 mL 0  . rosuvastatin (CRESTOR) 20 MG tablet TAKE 1 TABLET(20 MG) BY MOUTH DAILY 90 tablet 0  . zolpidem (AMBIEN) 5 MG tablet TAKE  1 TABLET BY MOUTH AT BEDTIME FOR SLEEP 90 tablet 1   No current facility-administered medications on file prior to visit.    Review of Systems Constitutional: Negative for other unusual diaphoresis, sweats, appetite or weight changes HENT: Negative for other worsening hearing loss, ear pain, facial swelling, mouth sores or neck stiffness.   Eyes: Negative for other worsening pain, redness or other visual disturbance.  Respiratory: Negative for other stridor or swelling Cardiovascular: Negative for other palpitations or other chest pain  Gastrointestinal: Negative for worsening diarrhea or loose stools, blood  in stool, distention or other pain Genitourinary: Negative for hematuria, flank pain or other change in urine volume.  Musculoskeletal: Negative for myalgias or other joint swelling.  Skin: Negative for other color change, or other wound or worsening drainage.  Neurological: Negative for other syncope or numbness. Hematological: Negative for other adenopathy or swelling Psychiatric/Behavioral: Negative for hallucinations, other worsening agitation, SI, self-injury, or new decreased concentration All other system neg per pt     Objective:   Physical Exam BP 124/76   Pulse 79   Temp 97.8 F (36.6 C) (Oral)   Ht 6\' 2"  (1.88 m)   Wt 201 lb (91.2 kg)   SpO2 94%   BMI 25.81 kg/m  VS noted,  Constitutional: Pt is oriented to person, place, and time. Appears well-developed and well-nourished, in no significant distress and comfortable Head: Normocephalic and atraumatic  Eyes: Conjunctivae and EOM are normal. Pupils are equal, round, and reactive to light Right Ear: External ear normal without discharge Left Ear: External ear normal without discharge Nose: Nose without discharge or deformity Mouth/Throat: Oropharynx is without other ulcerations and moist  Neck: Normal range of motion. Neck supple. No JVD present. No tracheal deviation present or significant neck LA or mass Cardiovascular: Normal rate, regular rhythm, normal heart sounds and intact distal pulses.   Pulmonary/Chest: WOB normal and breath sounds without rales or wheezing  Abdominal: Soft. Bowel sounds are normal. NT. No HSM  Musculoskeletal: Normal range of motion. Exhibits no edema Lymphadenopathy: Has no other cervical adenopathy.  Neurological: Pt is alert and oriented to person, place, and time. Pt has normal reflexes. No cranial nerve deficit. Motor grossly intact, Gait intact Skin: Skin is warm and dry. No rash noted or new ulcerations Psychiatric:  Has normal mood and affect. Behavior is normal without agitation No  other exam findings Lab Results  Component Value Date   WBC 6.4 11/07/2017   HGB 14.6 11/07/2017   HCT 44.0 11/07/2017   PLT 204.0 11/07/2017   GLUCOSE 102 (H) 11/07/2017   CHOL 128 11/07/2017   TRIG 69.0 11/07/2017   HDL 54.10 11/07/2017   LDLCALC 60 11/07/2017   ALT 20 11/07/2017   AST 21 11/07/2017   NA 140 11/07/2017   K 4.5 11/07/2017   CL 104 11/07/2017   CREATININE 1.14 11/07/2017   BUN 22 11/07/2017   CO2 28 11/07/2017   TSH 1.07 11/07/2017   PSA 3.15 04/04/2018   HGBA1C 6.1 11/07/2017       Assessment & Plan:

## 2018-11-12 NOTE — Assessment & Plan Note (Signed)
stable overall by history and exam, recent data reviewed with pt, and pt to continue medical treatment as before,  to f/u any worsening symptoms or concerns, for a1c with labs today 

## 2018-11-12 NOTE — Telephone Encounter (Signed)
Pt has been informed of results and expressed understanding.  °

## 2018-11-12 NOTE — Patient Instructions (Signed)
Please continue all other medications as before, and refills have been done if requested.  Please have the pharmacy call with any other refills you may need.  Please continue your efforts at being more active, low cholesterol diet, and weight control.  You are otherwise up to date with prevention measures today.  Please keep your appointments with your specialists as you may have planned  Your lab work was done this AM  You will be contacted by phone if any changes need to be made immediately.  Otherwise, you will receive a letter about your results with an explanation, but please check with MyChart first.  Please remember to sign up for MyChart if you have not done so, as this will be important to you in the future with finding out test results, communicating by private email, and scheduling acute appointments online when needed.  Please return in 1 year for your yearly visit, or sooner if needed

## 2018-11-12 NOTE — Assessment & Plan Note (Signed)
stable overall by history and exam, recent data reviewed with pt, and pt to continue medical treatment as before,  to f/u any worsening symptoms or concerns  

## 2018-11-12 NOTE — Assessment & Plan Note (Signed)
stable overall by history and exam, recent data reviewed with pt, and pt to continue medical treatment as before,  to f/u any worsening symptoms or concerns, for f/u lab today 

## 2018-11-12 NOTE — Telephone Encounter (Signed)
-----   Message from Biagio Borg, MD sent at 11/12/2018 12:39 PM EST ----- Left message on MyChart, pt to cont same tx except  The test results show that your current treatment is OK, except the PSA is increased as we discussed might happen at your visit.  We should refer you to Urology for further evaluation, to make sure prostate cancer is not an issue.  You should hear hopefully soon.  Shirron to please inform pt, I will do referral

## 2018-11-13 ENCOUNTER — Other Ambulatory Visit: Payer: Self-pay | Admitting: Internal Medicine

## 2018-11-23 ENCOUNTER — Encounter: Payer: Self-pay | Admitting: Internal Medicine

## 2018-12-08 ENCOUNTER — Other Ambulatory Visit: Payer: Self-pay

## 2018-12-08 MED ORDER — ALBUTEROL SULFATE HFA 108 (90 BASE) MCG/ACT IN AERS: INHALATION_SPRAY | RESPIRATORY_TRACT | 11 refills | Status: DC

## 2018-12-29 DIAGNOSIS — R972 Elevated prostate specific antigen [PSA]: Secondary | ICD-10-CM | POA: Diagnosis not present

## 2019-01-02 ENCOUNTER — Other Ambulatory Visit: Payer: Self-pay | Admitting: Internal Medicine

## 2019-01-05 ENCOUNTER — Encounter: Payer: Self-pay | Admitting: Internal Medicine

## 2019-02-12 ENCOUNTER — Encounter: Payer: Self-pay | Admitting: Internal Medicine

## 2019-02-12 MED ORDER — BENAZEPRIL HCL 20 MG PO TABS: 20.0000 mg | ORAL_TABLET | Freq: Every day | ORAL | 0 refills | Status: DC

## 2019-04-09 ENCOUNTER — Other Ambulatory Visit: Payer: Self-pay | Admitting: Internal Medicine

## 2019-04-09 NOTE — Telephone Encounter (Signed)
Done erx 

## 2019-04-13 ENCOUNTER — Other Ambulatory Visit: Payer: Self-pay | Admitting: Internal Medicine

## 2019-04-13 MED ORDER — AMLODIPINE BESYLATE 10 MG PO TABS: 10.0000 mg | ORAL_TABLET | Freq: Every day | ORAL | 1 refills | Status: DC

## 2019-04-13 NOTE — Telephone Encounter (Signed)
Copied from [place] [id number]. Topic: Quick Communication - Rx Refill/Question >> Apr 13, 2019  1:43 PM Rotan, [place] D wrote: Medication: amLODipine (NORVASC) 10 MG tablet / Pharmacy stated pt normally receives 90 day refills  Has the patient contacted their pharmacy? Yes.   (Agent: If no, request that the patient contact the pharmacy for the refill.) (Agent: If yes, when and what did the pharmacy advise?)  Preferred Pharmacy (with phone number or street name): [id number] - [place], [place] [address] AT [place] [phone] (Phone) [phone] (Fax)    Agent: Please be advised that RX refills may take up to 3 business days. We ask that you follow-up with your pharmacy.

## 2019-04-13 NOTE — Telephone Encounter (Signed)
Refill sent. See meds.  

## 2019-04-14 ENCOUNTER — Other Ambulatory Visit: Payer: Self-pay

## 2019-04-14 MED ORDER — AMLODIPINE BESYLATE 10 MG PO TABS: 10.0000 mg | ORAL_TABLET | Freq: Every day | ORAL | 1 refills | Status: DC

## 2019-05-14 ENCOUNTER — Other Ambulatory Visit: Payer: Self-pay | Admitting: Internal Medicine

## 2019-05-14 ENCOUNTER — Encounter: Payer: Self-pay | Admitting: Internal Medicine

## 2019-05-14 MED ORDER — BENAZEPRIL HCL 20 MG PO TABS: 20.0000 mg | ORAL_TABLET | Freq: Every day | ORAL | 1 refills | Status: DC

## 2019-05-19 ENCOUNTER — Telehealth: Payer: Self-pay | Admitting: Internal Medicine

## 2019-05-19 NOTE — Telephone Encounter (Signed)
Left message for patient to call office to schedule Annual Wellness Visit. Patient has never received an AWV before; please schedule anytime with Health Coach Nurse Emelia Loron, RN at [facility].

## 2019-06-05 ENCOUNTER — Other Ambulatory Visit: Payer: Self-pay

## 2019-06-18 DIAGNOSIS — Z23 Encounter for immunization: Secondary | ICD-10-CM | POA: Diagnosis not present

## 2019-06-30 ENCOUNTER — Encounter: Payer: Self-pay | Admitting: Internal Medicine

## 2019-07-08 ENCOUNTER — Encounter: Payer: Self-pay | Admitting: Internal Medicine

## 2019-07-08 ENCOUNTER — Encounter: Payer: Self-pay | Admitting: Gastroenterology

## 2019-07-20 ENCOUNTER — Ambulatory Visit: Payer: Medicare Other | Admitting: Gastroenterology

## 2019-10-09 ENCOUNTER — Telehealth: Payer: Self-pay | Admitting: Internal Medicine

## 2019-10-09 MED ORDER — ZOLPIDEM TARTRATE 5 MG PO TABS: ORAL_TABLET | ORAL | 0 refills | Status: DC

## 2019-10-09 NOTE — Telephone Encounter (Signed)
ambien done erx x 1 mo  Please to call pt - due for ROV jan 2021 for further refills

## 2019-10-20 ENCOUNTER — Other Ambulatory Visit: Payer: Self-pay | Admitting: *Deleted

## 2019-10-20 MED ORDER — AMLODIPINE BESYLATE 10 MG PO TABS: 10.0000 mg | ORAL_TABLET | Freq: Every day | ORAL | 0 refills | Status: DC

## 2019-11-04 ENCOUNTER — Encounter: Payer: Self-pay | Admitting: Internal Medicine

## 2019-11-04 ENCOUNTER — Other Ambulatory Visit: Payer: Self-pay | Admitting: Internal Medicine

## 2019-11-04 MED ORDER — ESOMEPRAZOLE MAGNESIUM 40 MG PO CPDR: DELAYED_RELEASE_CAPSULE | ORAL | 0 refills | Status: DC

## 2019-11-04 NOTE — Telephone Encounter (Signed)
Copied from [place] [phone] [phone]. Topic: Quick Communication - Rx Refill/Question >> Nov 04, 2019  2:00 PM Mcneil, Ja-Kwan wrote: Medication: esomeprazole (NEXIUM) 40 MG capsule  Has the patient contacted their pharmacy? yes   Preferred Pharmacy (with phone number or street name): [id number] - [place], [address] AT [address]  Phone: [phone]  Fax: [phone]  Agent: Please be advised that RX refills may take up to 3 business days. We ask that you follow-up with your pharmacy.

## 2019-11-05 ENCOUNTER — Ambulatory Visit: Payer: Medicare Other | Attending: Internal Medicine

## 2019-11-05 ENCOUNTER — Other Ambulatory Visit: Payer: Self-pay

## 2019-11-05 ENCOUNTER — Encounter: Payer: Self-pay | Admitting: Internal Medicine

## 2019-11-05 DIAGNOSIS — Z20822 Contact with and (suspected) exposure to covid-19: Secondary | ICD-10-CM

## 2019-11-05 MED ORDER — GUAIFENESIN-CODEINE 100-10 MG/5ML PO SYRP: 5.0000 mL | ORAL_SOLUTION | Freq: Three times a day (TID) | ORAL | 0 refills | Status: DC | PRN

## 2019-11-06 LAB — NOVEL CORONAVIRUS, NAA: SARS-CoV-2, NAA: DETECTED — AB

## 2019-11-09 ENCOUNTER — Encounter: Payer: Self-pay | Admitting: Internal Medicine

## 2019-11-09 MED ORDER — ZOLPIDEM TARTRATE 5 MG PO TABS: ORAL_TABLET | ORAL | 0 refills | Status: DC

## 2019-11-09 MED ORDER — GUAIFENESIN-CODEINE 100-10 MG/5ML PO SYRP: 5.0000 mL | ORAL_SOLUTION | Freq: Three times a day (TID) | ORAL | 0 refills | Status: DC | PRN

## 2019-11-09 NOTE — Addendum Note (Signed)
Addended by: Biagio Borg on: 11/09/2019 07:49 PM   Modules accepted: Orders

## 2019-11-13 ENCOUNTER — Encounter: Payer: Self-pay | Admitting: Internal Medicine

## 2019-11-16 ENCOUNTER — Other Ambulatory Visit: Payer: Self-pay

## 2019-11-16 MED ORDER — BENAZEPRIL HCL 20 MG PO TABS: 20.0000 mg | ORAL_TABLET | Freq: Every day | ORAL | 1 refills | Status: DC

## 2019-11-18 ENCOUNTER — Ambulatory Visit: Payer: Medicare Other | Admitting: Internal Medicine

## 2019-12-02 ENCOUNTER — Ambulatory Visit: Payer: Medicare Other | Admitting: Internal Medicine

## 2019-12-03 ENCOUNTER — Ambulatory Visit: Payer: Medicare Other

## 2019-12-08 ENCOUNTER — Encounter: Payer: Self-pay | Admitting: Internal Medicine

## 2019-12-09 ENCOUNTER — Encounter: Payer: Self-pay | Admitting: Internal Medicine

## 2019-12-11 ENCOUNTER — Ambulatory Visit: Payer: Medicare Other | Attending: Internal Medicine

## 2019-12-11 ENCOUNTER — Other Ambulatory Visit: Payer: Self-pay

## 2019-12-11 DIAGNOSIS — Z23 Encounter for immunization: Secondary | ICD-10-CM | POA: Insufficient documentation

## 2019-12-11 MED ORDER — ZOLPIDEM TARTRATE 5 MG PO TABS: ORAL_TABLET | ORAL | 0 refills | Status: DC

## 2019-12-11 NOTE — Progress Notes (Signed)
   Covid-19 Vaccination Clinic  Name:  MAREK NABORS    MRN: [id number] DOB: 1946-01-25  12/11/2019  Mr. Oneil was observed post Covid-19 immunization for 15 minutes without incidence. He was provided with Vaccine Information Sheet and instruction to access the V-Safe system.   Mr. Farleigh was instructed to call 911 with any severe reactions post vaccine: Marland Kitchen Difficulty breathing  . Swelling of your face and throat  . A fast heartbeat  . A bad rash all over your body  . Dizziness and weakness    Immunizations Administered    Name Date Dose VIS Date Route   Pfizer COVID-19 Vaccine 12/11/2019  5:19 PM 0.3 mL 10/16/2019 Intramuscular   Manufacturer: [place]   Lot: [id number]   [place]: [id number]

## 2019-12-20 ENCOUNTER — Other Ambulatory Visit: Payer: Self-pay | Admitting: Internal Medicine

## 2019-12-20 NOTE — Telephone Encounter (Signed)
Please refill as per office routine med refill policy (all routine meds refilled for 3 mo or monthly per pt preference up to one year from last visit, then month to month grace period for 3 mo, then further med refills will have to be denied)  

## 2019-12-21 ENCOUNTER — Encounter: Payer: Self-pay | Admitting: Internal Medicine

## 2019-12-21 ENCOUNTER — Ambulatory Visit (INDEPENDENT_AMBULATORY_CARE_PROVIDER_SITE_OTHER): Payer: Medicare Other | Admitting: Internal Medicine

## 2019-12-21 ENCOUNTER — Other Ambulatory Visit: Payer: Self-pay

## 2019-12-21 VITALS — BP 144/80 | HR 61 | Temp 97.8°F | Ht 74.0 in | Wt 202.0 lb

## 2019-12-21 DIAGNOSIS — E559 Vitamin D deficiency, unspecified: Secondary | ICD-10-CM

## 2019-12-21 DIAGNOSIS — E611 Iron deficiency: Secondary | ICD-10-CM | POA: Diagnosis not present

## 2019-12-21 DIAGNOSIS — E785 Hyperlipidemia, unspecified: Secondary | ICD-10-CM | POA: Diagnosis not present

## 2019-12-21 DIAGNOSIS — E538 Deficiency of other specified B group vitamins: Secondary | ICD-10-CM

## 2019-12-21 DIAGNOSIS — R972 Elevated prostate specific antigen [PSA]: Secondary | ICD-10-CM

## 2019-12-21 DIAGNOSIS — I700 Atherosclerosis of aorta: Secondary | ICD-10-CM | POA: Diagnosis not present

## 2019-12-21 DIAGNOSIS — I10 Essential (primary) hypertension: Secondary | ICD-10-CM | POA: Diagnosis not present

## 2019-12-21 DIAGNOSIS — R739 Hyperglycemia, unspecified: Secondary | ICD-10-CM

## 2019-12-21 LAB — BASIC METABOLIC PANEL
BUN: 23 mg/dL (ref 6–23)
CO2: 28 mEq/L (ref 19–32)
Calcium: 9.7 mg/dL (ref 8.4–10.5)
Chloride: 102 mEq/L (ref 96–112)
Creatinine, Ser: 1.29 mg/dL (ref 0.40–1.50)
GFR: 54.87 mL/min — ABNORMAL LOW (ref >60.00)
Glucose, Bld: 94 mg/dL (ref 70–99)
Potassium: 4.5 mEq/L (ref 3.5–5.1)
Sodium: 139 mEq/L (ref 135–145)

## 2019-12-21 LAB — IBC PANEL
Iron: 100 ug/dL (ref 42–165)
Saturation Ratios: 26.3 % (ref 20.0–50.0)
Transferrin: 272 mg/dL (ref 212.0–360.0)

## 2019-12-21 LAB — HEPATIC FUNCTION PANEL
ALT: 17 U/L (ref 0–53)
AST: 19 U/L (ref 0–37)
Albumin: 4.6 g/dL (ref 3.5–5.2)
Alkaline Phosphatase: 39 U/L (ref 39–117)
Bilirubin, Direct: 0.2 mg/dL (ref 0.0–0.3)
Total Bilirubin: 0.7 mg/dL (ref 0.2–1.2)
Total Protein: 7.5 g/dL (ref 6.0–8.3)

## 2019-12-21 LAB — CBC WITH DIFFERENTIAL/PLATELET
Basophils Absolute: 0.1 10*3/uL (ref 0.0–0.1)
Basophils Relative: 0.9 % (ref 0.0–3.0)
Eosinophils Absolute: 0.4 10*3/uL (ref 0.0–0.7)
Eosinophils Relative: 4.2 % (ref 0.0–5.0)
HCT: 43.4 % (ref 39.0–52.0)
Hemoglobin: 14.3 g/dL (ref 13.0–17.0)
Lymphocytes Relative: 25.4 % (ref 12.0–46.0)
Lymphs Abs: 2.3 10*3/uL (ref 0.7–4.0)
MCHC: 32.9 g/dL (ref 30.0–36.0)
MCV: 89.6 fl (ref 78.0–100.0)
Monocytes Absolute: 0.6 10*3/uL (ref 0.1–1.0)
Monocytes Relative: 6.3 % (ref 3.0–12.0)
Neutro Abs: 5.6 10*3/uL (ref 1.4–7.7)
Neutrophils Relative %: 63.2 % (ref 43.0–77.0)
Platelets: 230 10*3/uL (ref 150.0–400.0)
RBC: 4.85 Mil/uL (ref 4.22–5.81)
RDW: 13.5 % (ref 11.5–15.5)
WBC: 8.9 10*3/uL (ref 4.0–10.5)

## 2019-12-21 LAB — LIPID PANEL
Cholesterol: 139 mg/dL (ref 0–200)
HDL: 57.4 mg/dL (ref >39.00)
LDL Cholesterol: 65 mg/dL (ref 0–99)
NonHDL: 81.81
Total CHOL/HDL Ratio: 2
Triglycerides: 82 mg/dL (ref 0.0–149.0)
VLDL: 16.4 mg/dL (ref 0.0–40.0)

## 2019-12-21 LAB — VITAMIN B12: Vitamin B-12: 289 pg/mL (ref 211–911)

## 2019-12-21 LAB — VITAMIN D 25 HYDROXY (VIT D DEFICIENCY, FRACTURES): VITD: 28.44 ng/mL — ABNORMAL LOW (ref 30.00–100.00)

## 2019-12-21 LAB — PSA: PSA: 2.29 ng/mL (ref 0.10–4.00)

## 2019-12-21 LAB — TSH: TSH: 1.22 u[IU]/mL (ref 0.35–4.50)

## 2019-12-21 LAB — HEMOGLOBIN A1C: Hgb A1c MFr Bld: 6.2 % (ref 4.6–6.5)

## 2019-12-21 MED ORDER — ZOLPIDEM TARTRATE 5 MG PO TABS: ORAL_TABLET | ORAL | 1 refills | Status: DC

## 2019-12-21 NOTE — Patient Instructions (Addendum)
Your right ear was irrigated today  OK to use the swimmers ear drops  Please call for ENT referral if your discomfort persists  We have discussed the Cardiac CT Score test to measure the calcification level (if any) in your heart arteries.  This test has been ordered in our [facility], so please call [facility] directly, as they prefer this, at [phone] to be scheduled.  Please check your BP at least once per day for 2 weeks and call with your average results ; the goal is to be at least less than 140/90, and even better would be 130/80  Please continue all other medications as before, and refills have been done if requested.  Please have the pharmacy call with any other refills you may need.  Please continue your efforts at being more active, low cholesterol diet, and weight control.  You are otherwise up to date with prevention measures today.  Please keep your appointments with your specialists as you may have planned  Please go to the LAB at the blood drawing area for the tests to be done  You will be contacted by phone if any changes need to be made immediately.  Otherwise, you will receive a letter about your results with an explanation, but please check with MyChart first.  Please remember to sign up for MyChart if you have not done so, as this will be important to you in the future with finding out test results, communicating by private email, and scheduling acute appointments online when needed.  Please make an Appointment to return for your 1 year visit, or sooner if needed

## 2019-12-21 NOTE — Assessment & Plan Note (Signed)
stable overall by history and exam, recent data reviewed with pt, and pt to continue medical treatment as before,  to f/u any worsening symptoms or concerns  

## 2019-12-21 NOTE — Assessment & Plan Note (Addendum)
stable overall by history and exam, recent data reviewed with pt, and pt to continue medical treatment as before,  to f/u any worsening symptoms or concerns  I spent 30 minutes in addition to time for wellness examination in preparing to see the patient by review of recent labs, imaging and procedures, obtaining and reviewing separately obtained history, communicating with the patient and family or caregiver, ordering medications, tests or procedures, and documenting clinical information in the EHR including the differential Dx, treatment, and any further evaluation and other management of hyperglycemia, HLD, HTN

## 2019-12-21 NOTE — Progress Notes (Signed)
Subjective:    Patient ID: Oscar Dixon, male    DOB: 26-Mar-1946, 74 y.o.   MRN: [id number]  HPI  Here for yearly f/u;  Overall doing ok;  Pt denies Chest pain, worsening SOB, DOE, wheezing, orthopnea, PND, worsening LE edema, palpitations, dizziness or syncope.  Pt denies neurological change such as new headache, facial or extremity weakness.  Pt denies polydipsia, polyuria, or low sugar symptoms. Pt states overall good compliance with treatment and medications, good tolerability, and has been trying to follow appropriate diet.  Pt denies worsening depressive symptoms, suicidal ideation or panic. No fever, night sweats, wt loss, loss of appetite, or other constitutional symptoms.  Pt states good ability with ADL's, has low fall risk, home safety reviewed and adequate, no other significant changes in hearing or vision, and only occasionally active with exercise. Recent COVID resolved Wt Readings from Last 3 Encounters:  12/21/19 202 lb (91.6 kg)  11/12/18 201 lb (91.2 kg)  09/09/18 205 lb 9.6 oz (93.3 kg)   BP Readings from Last 3 Encounters:  12/21/19 (!) 144/80  11/12/18 124/76  09/09/18 124/78   Past Medical History:  Diagnosis Date  . 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  . Arthritis   . CARPAL TUNNEL SYNDROME, BILATERAL 12/15/2009  . Cataract   . DYSPNEA ON EXERTION 12/15/2009  . Erectile dysfunction 04/12/2012  . FATIGUE 03/27/2010  . GERD 05/20/2007  . GOUT 03/27/2010  . HAND PAIN 12/15/2009  . Headache(784.0) 08/25/2009  . HERNIA, UMBILICAL [id number]  . HYPERLIPIDEMIA 07/09/2008  . HYPERTENSION 05/20/2007  . LIBIDO, DECREASED 03/27/2010  . Mitral regurgitation 09/12/2011  . 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 CATARACT EXTRACTION     bilateral  . COLONOSCOPY  04-24-2005  . s/p ankle surgury    . s/p basal cell cancer    . s/p right elbow surugury      reports that he has quit smoking. He has never used smokeless tobacco. He  reports current alcohol use. He reports that he does not use drugs. family history includes Alcohol abuse in his mother; Cancer in his brother; Hypertension in his father and mother. Allergies  Allergen Reactions  . Augmentin [Amoxicillin-Pot Clavulanate] Diarrhea    diarrhea   Current Outpatient Medications on File Prior to Visit  Medication Sig Dispense Refill  . amLODipine (NORVASC) 10 MG tablet Take 1 tablet (10 mg total) by mouth daily. Must keep annual appt that's due in January must see provider for future refills 30 tablet 0  . aspirin 81 MG EC tablet Take 81 mg by mouth daily.      . benazepril (LOTENSIN) 20 MG tablet Take 1 tablet (20 mg total) by mouth daily. 90 tablet 1  . diclofenac sodium (VOLTAREN) 1 % GEL APPLY 4 GRAMS TOPICALLY FOUR TIMES A DAY AS NEEDED FOR PAIN 100 g 10  . esomeprazole (NEXIUM) 40 MG capsule TAKE 1 CAPSULE BY MOUTH ONCE DAILY AT NOON 90 capsule 0  . rosuvastatin (CRESTOR) 20 MG tablet TAKE 1 TABLET(20 MG) BY MOUTH DAILY 90 tablet 3   No current facility-administered medications on file prior to visit.   Review of Systems All otherwise neg per pt     Objective:   Physical Exam BP (!) 144/80   Pulse 61   Temp 97.8 F (36.6 C)   Ht 6\' 2"  (1.88 m)   Wt 202 lb (91.6 kg)   SpO2 98%   BMI 25.94 kg/m  VS noted,  Constitutional: Pt appears in NAD HENT: Head: NCAT.  Right Ear: External ear normal.  Left Ear: External ear normal.  Eyes: . Pupils are equal, round, and reactive to light. Conjunctivae and EOM are normal Nose: without d/c or deformity Neck: Neck supple. Gross normal ROM Cardiovascular: Normal rate and regular rhythm.   Pulmonary/Chest: Effort normal and breath sounds without rales or wheezing.  Abd:  Soft, NT, ND, + BS, no organomegaly Neurological: Pt is alert. At baseline orientation, motor grossly intact Skin: Skin is warm. No rashes, other new lesions, no LE edema Psychiatric: Pt behavior is normal without agitation  All  otherwise neg per pt  Lab Results  Component Value Date   WBC 6.2 11/12/2018   HGB 14.0 11/12/2018   HCT 42.2 11/12/2018   PLT 203.0 11/12/2018   GLUCOSE 123 (H) 11/12/2018   CHOL 133 11/12/2018   TRIG 56.0 11/12/2018   HDL 59.60 11/12/2018   LDLCALC 62 11/12/2018   ALT 17 11/12/2018   AST 21 11/12/2018   NA 141 11/12/2018   K 4.4 11/12/2018   CL 104 11/12/2018   CREATININE 1.16 11/12/2018   BUN 20 11/12/2018   CO2 30 11/12/2018   TSH 1.13 11/12/2018   PSA 5.04 (H) 11/12/2018   HGBA1C 6.1 11/12/2018       Assessment & Plan:

## 2019-12-22 ENCOUNTER — Other Ambulatory Visit: Payer: Self-pay | Admitting: Internal Medicine

## 2019-12-22 LAB — URINALYSIS, ROUTINE W REFLEX MICROSCOPIC
Bilirubin Urine: NEGATIVE
Hgb urine dipstick: NEGATIVE
Leukocytes,Ua: NEGATIVE
Nitrite: NEGATIVE
RBC / HPF: NONE SEEN (ref 0–?)
Specific Gravity, Urine: >=1.03 — AB (ref 1.000–1.030)
Total Protein, Urine: NEGATIVE
Urine Glucose: NEGATIVE
Urobilinogen, UA: 0.2 (ref 0.0–1.0)
WBC, UA: NONE SEEN (ref 0–?)
pH: 5.5 (ref 5.0–8.0)

## 2019-12-22 MED ORDER — VITAMIN D (ERGOCALCIFEROL) 1.25 MG (50000 UNIT) PO CAPS: 50000.0000 [IU] | ORAL_CAPSULE | ORAL | 0 refills | Status: DC

## 2019-12-24 ENCOUNTER — Ambulatory Visit: Payer: Medicare Other

## 2019-12-28 ENCOUNTER — Encounter: Payer: Self-pay | Admitting: Internal Medicine

## 2019-12-28 MED ORDER — BENAZEPRIL HCL 40 MG PO TABS: 40.0000 mg | ORAL_TABLET | Freq: Every day | ORAL | 3 refills | Status: DC

## 2020-01-05 ENCOUNTER — Encounter: Payer: Self-pay | Admitting: Internal Medicine

## 2020-01-05 ENCOUNTER — Ambulatory Visit: Payer: Medicare Other

## 2020-01-05 ENCOUNTER — Ambulatory Visit: Payer: Medicare Other | Attending: Internal Medicine

## 2020-01-05 DIAGNOSIS — Z23 Encounter for immunization: Secondary | ICD-10-CM | POA: Insufficient documentation

## 2020-01-05 NOTE — Progress Notes (Signed)
   Covid-19 Vaccination Clinic  Name:  Oscar Dixon    MRN: [id number] DOB: 04/20/1946  01/05/2020  [name] was observed post Covid-19 immunization for 15 minutes without incident. He was provided with Vaccine Information Sheet and instruction to access the V-Safe system.   [name] was instructed to call 911 with any severe reactions post vaccine: Marland Kitchen Difficulty breathing  . Swelling of face and throat  . A fast heartbeat  . A bad rash all over body  . Dizziness and weakness   Immunizations Administered    Name Date Dose VIS Date Route   Pfizer COVID-19 Vaccine 01/05/2020 12:02 PM 0.3 mL 10/16/2019 Intramuscular   Manufacturer: [place]   Lot: [id number]   [place]: [id number]

## 2020-01-12 ENCOUNTER — Other Ambulatory Visit: Payer: Self-pay | Admitting: Internal Medicine

## 2020-01-12 NOTE — Telephone Encounter (Signed)
Done erx 

## 2020-01-26 ENCOUNTER — Ambulatory Visit (INDEPENDENT_AMBULATORY_CARE_PROVIDER_SITE_OTHER)
Admission: RE | Admit: 2020-01-26 | Discharge: 2020-01-26 | Disposition: A | Discharge status: Home / Self Care | Payer: Self-pay | Source: Ambulatory Visit | Attending: Internal Medicine | Admitting: Internal Medicine

## 2020-01-26 ENCOUNTER — Other Ambulatory Visit: Payer: Self-pay

## 2020-01-26 ENCOUNTER — Other Ambulatory Visit: Payer: Self-pay | Admitting: Internal Medicine

## 2020-01-26 DIAGNOSIS — I10 Essential (primary) hypertension: Secondary | ICD-10-CM

## 2020-01-26 DIAGNOSIS — I700 Atherosclerosis of aorta: Secondary | ICD-10-CM

## 2020-01-26 DIAGNOSIS — R931 Abnormal findings on diagnostic imaging of heart and coronary circulation: Secondary | ICD-10-CM

## 2020-01-26 DIAGNOSIS — R739 Hyperglycemia, unspecified: Secondary | ICD-10-CM

## 2020-01-26 DIAGNOSIS — E785 Hyperlipidemia, unspecified: Secondary | ICD-10-CM

## 2020-01-31 ENCOUNTER — Other Ambulatory Visit: Payer: Self-pay | Admitting: Internal Medicine

## 2020-01-31 NOTE — Telephone Encounter (Signed)
Please refill as per office routine med refill policy (all routine meds refilled for 3 mo or monthly per pt preference up to one year from last visit, then month to month grace period for 3 mo, then further med refills will have to be denied)  

## 2020-02-01 ENCOUNTER — Encounter: Payer: Self-pay | Admitting: Internal Medicine

## 2020-02-24 ENCOUNTER — Ambulatory Visit (INDEPENDENT_AMBULATORY_CARE_PROVIDER_SITE_OTHER): Payer: Medicare Other | Admitting: Cardiovascular Disease

## 2020-02-24 ENCOUNTER — Encounter: Payer: Self-pay | Admitting: Cardiovascular Disease

## 2020-02-24 ENCOUNTER — Other Ambulatory Visit: Payer: Self-pay

## 2020-02-24 VITALS — BP 136/70 | HR 68 | Ht 74.0 in | Wt 204.4 lb

## 2020-02-24 DIAGNOSIS — R931 Abnormal findings on diagnostic imaging of heart and coronary circulation: Secondary | ICD-10-CM | POA: Diagnosis not present

## 2020-02-24 DIAGNOSIS — I10 Essential (primary) hypertension: Secondary | ICD-10-CM | POA: Diagnosis not present

## 2020-02-24 DIAGNOSIS — E782 Mixed hyperlipidemia: Secondary | ICD-10-CM

## 2020-02-24 DIAGNOSIS — R0600 Dyspnea, unspecified: Secondary | ICD-10-CM

## 2020-02-24 DIAGNOSIS — R0609 Other forms of dyspnea: Secondary | ICD-10-CM

## 2020-02-24 NOTE — Progress Notes (Signed)
02/24/2020 Oscar Dixon   [birth year]  PX:[id number]  Primary Physician Biagio Borg, MD Primary Cardiologist: Lorretta Harp MD Lupe Carney, [place]  HPI:  Oscar Dixon is a 74 y.o. mildly overweight married Caucasian male father of 3 daughters, grandfather 7 grandchildren who retired at age 81 from working at [employer] where he was for 40 years.  He was referred by Dr. Cathlean Cower, his PCP, for evaluation of a elevated coronary calcium score.  He smoked less than 1 pack/day having quit 25 years ago.  History of hypertension hyperlipidemia.  There is no family history of heart disease.  Never had heart attack or stroke.  Does complain of some dyspnea on exertion.  Does drink of scotch daily (Johnny Walker black and Durs).  He is on Crestor 20 with an excellent lipid profile.  He recently had a coronary calcium score performed 01/26/2020 which resulted in 1759.   Current Meds  Medication Sig  . albuterol (VENTOLIN HFA) 108 (90 Base) MCG/ACT inhaler INHALE 2 PUFFS INTO THE LUNGS EVERY 6 HOURS AS NEEDED FOR WHEEZING OR SHORTNESS OF BREATH  . amLODipine (NORVASC) 10 MG tablet Take 1 tablet (10 mg total) by mouth daily. Must keep annual appt that's due in January must see provider for future refills  . aspirin 81 MG EC tablet Take 81 mg by mouth daily.    . benazepril (LOTENSIN) 40 MG tablet Take 1 tablet (40 mg total) by mouth daily.  . diclofenac sodium (VOLTAREN) 1 % GEL APPLY 4 GRAMS TOPICALLY FOUR TIMES A DAY AS NEEDED FOR PAIN  . esomeprazole (NEXIUM) 40 MG capsule TAKE 1 CAPSULE BY MOUTH EVERY DAY AT NOON  . rosuvastatin (CRESTOR) 20 MG tablet TAKE 1 TABLET(20 MG) BY MOUTH DAILY  . Vitamin D, Ergocalciferol, (DRISDOL) 1.25 MG (50000 UNIT) CAPS capsule Take 1 capsule (50,000 Units total) by mouth every 7 (seven) days.  Marland Kitchen zolpidem (AMBIEN) 5 MG tablet TAKE 1 TABLET BY MOUTH AT BEDTIME AS NEEDED FOR SLEEP     Allergies  Allergen Reactions  . Augmentin  [Amoxicillin-Pot Clavulanate] Diarrhea    diarrhea    Social History   Socioeconomic History  . Marital status: Married    Spouse name: Not on file  . Number of children: 3  . Years of education: college  . Highest education level: Not on file  Occupational History  . Occupation: Museum/gallery curator   Tobacco Use  . Smoking status: Former Research scientist (life sciences)  . Smokeless tobacco: Never Used  Substance and Sexual Activity  . Alcohol use: Yes    Alcohol/week: 0.0 standard drinks    Comment: socially  . Drug use: No  . Sexual activity: Not on file  Other Topics Concern  . Not on file  Social History Narrative   Lives w/ wife   Caffeine use: 2 cups coffee every am   Right handed    Social Determinants of Health   Financial Resource Strain:   . Difficulty of Paying Living Expenses:   Food Insecurity:   . Worried About Charity fundraiser in the Last Year:   . Arboriculturist in the Last Year:   Transportation Needs:   . Film/video editor (Medical):   Marland Kitchen Lack of Transportation (Non-Medical):   Physical Activity:   . Days of Exercise per Week:   . Minutes of Exercise per Session:   Stress:   . Feeling of Stress :   Social Connections:   .  Frequency of Communication with Friends and Family:   . Frequency of Social Gatherings with Friends and Family:   . Attends Religious Services:   . Active Member of Clubs or Organizations:   . Attends Archivist Meetings:   Marland Kitchen Marital Status:   Intimate Partner Violence:   . Fear of Current or Ex-Partner:   . Emotionally Abused:   Marland Kitchen Physically Abused:   . Sexually Abused:      Review of Systems: General: negative for chills, fever, night sweats or weight changes.  Cardiovascular: negative for chest pain, dyspnea on exertion, edema, orthopnea, palpitations, paroxysmal nocturnal dyspnea or shortness of breath Dermatological: negative for rash Respiratory: negative for cough or wheezing Urologic: negative for hematuria Abdominal:  negative for nausea, vomiting, diarrhea, bright red blood per rectum, melena, or hematemesis Neurologic: negative for visual changes, syncope, or dizziness All other systems reviewed and are otherwise negative except as noted above.    Blood pressure 136/70, pulse 68, height 6\' 2"  (1.88 m), weight 204 lb 6.4 oz (92.7 kg), SpO2 98 %.  General appearance: alert and no distress Neck: no adenopathy, no carotid bruit, no JVD, supple, symmetrical, trachea midline and thyroid not enlarged, symmetric, no tenderness/mass/nodules Lungs: clear to auscultation bilaterally Heart: regular rate and rhythm, S1, S2 normal, no murmur, click, rub or gallop Extremities: extremities normal, atraumatic, no cyanosis or edema Pulses: 2+ and symmetric Skin: Skin color, texture, turgor normal. No rashes or lesions Neurologic: Alert and oriented X 3, normal strength and tone. Normal symmetric reflexes. Normal coordination and gait  EKG sinus rhythm at 62 without ST or T wave changes.  I personally reviewed this EKG  ASSESSMENT AND PLAN:   Hyperlipidemia History of hyperlipidemia on Crestor 20 mg a day with lipid profile performed 12/21/2019 revealing total cholesterol 139, LDL of 65 and HDL 57.  Essential hypertension History of essential hypertension blood pressure measured today 136/70.  He is on amlodipine and Lotensin.  Elevated coronary artery calcium score Recent coronary calcium score performed 01/26/2020 was 1759, significantly elevated.  He does notice some increasing dyspnea on exertion on activity.  In plan to get a Lexiscan Myoview stress test to rule out ischemia.      Lorretta Harp MD FACP,FACC,FAHA, [facility] 02/24/2020 4:36 PM

## 2020-02-24 NOTE — Assessment & Plan Note (Signed)
History of hyperlipidemia on Crestor 20 mg a day with lipid profile performed 12/21/2019 revealing total cholesterol 139, LDL of 65 and HDL 57.

## 2020-02-24 NOTE — Assessment & Plan Note (Signed)
Recent coronary calcium score performed 01/26/2020 was 1759, significantly elevated.  He does notice some increasing dyspnea on exertion on activity.  In plan to get a Lexiscan Myoview stress test to rule out ischemia.

## 2020-02-24 NOTE — Assessment & Plan Note (Signed)
History of essential hypertension blood pressure measured today 136/70.  He is on amlodipine and Lotensin.

## 2020-02-24 NOTE — Patient Instructions (Signed)
Medication Instructions:  Your physician recommends that you continue on your current medications as directed. Please refer to the Current Medication list given to you today.  *If you need a refill on your cardiac medications before your next appointment, please call your pharmacy*  Testing/Procedures: Your physician has requested that you have a lexiscan myoview. For further information please visit [email or web address]. Please follow instruction sheet, as given.  Follow-Up: At [facility], you and your health needs are our priority.  As part of our continuing mission to provide you with exceptional heart care, we have created designated Provider Care Teams.  These Care Teams include your primary Cardiologist (physician) and Advanced Practice Providers (APPs -  Physician Assistants and Nurse Practitioners) who all work together to provide you with the care you need, when you need it.  We recommend signing up for the patient portal called "MyChart".  Sign up information is provided on this After Visit Summary.  MyChart is used to connect with patients for Virtual Visits (Telemedicine).  Patients are able to view lab/test results, encounter notes, upcoming appointments, etc.  Non-urgent messages can be sent to your provider as well.   To learn more about what you can do with MyChart, go to [email or web address].    Your next appointment:   6 month(s)  The format for your next appointment:   In Person  Provider:   Quay Burow, MD

## 2020-03-03 ENCOUNTER — Telehealth (HOSPITAL_COMMUNITY): Payer: Self-pay

## 2020-03-03 NOTE — Telephone Encounter (Signed)
Encounter complete. 

## 2020-03-08 ENCOUNTER — Ambulatory Visit (HOSPITAL_COMMUNITY)
Admission: RE | Admit: 2020-03-08 | Discharge: 2020-03-08 | Disposition: A | Discharge status: Home / Self Care | Payer: Medicare Other | Source: Ambulatory Visit | Attending: Cardiovascular Disease | Admitting: Cardiovascular Disease

## 2020-03-08 ENCOUNTER — Other Ambulatory Visit: Payer: Self-pay

## 2020-03-08 DIAGNOSIS — R0609 Other forms of dyspnea: Secondary | ICD-10-CM

## 2020-03-08 DIAGNOSIS — R931 Abnormal findings on diagnostic imaging of heart and coronary circulation: Secondary | ICD-10-CM | POA: Diagnosis not present

## 2020-03-08 DIAGNOSIS — R0600 Dyspnea, unspecified: Secondary | ICD-10-CM | POA: Insufficient documentation

## 2020-03-08 LAB — MYOCARDIAL PERFUSION IMAGING
LV dias vol: 128 mL (ref 62–150)
LV sys vol: 64 mL
Peak HR: 95 {beats}/min
Rest HR: 54 {beats}/min
SDS: 1
SRS: 6
SSS: 7
TID: 0.99

## 2020-03-08 MED ORDER — TECHNETIUM TC 99M TETROFOSMIN IV KIT
10.4000 | PACK | Freq: Once | INTRAVENOUS | Status: AC | PRN
  Administered 2020-03-08: 10.4 via INTRAVENOUS
  Filled 2020-03-08: qty 11

## 2020-03-08 MED ORDER — TECHNETIUM TC 99M TETROFOSMIN IV KIT
31.2000 | PACK | Freq: Once | INTRAVENOUS | Status: AC | PRN
  Administered 2020-03-08: 31.2 via INTRAVENOUS
  Filled 2020-03-08: qty 32

## 2020-03-08 MED ORDER — REGADENOSON 0.4 MG/5ML IV SOLN
0.4000 mg | Freq: Once | INTRAVENOUS | Status: AC
  Administered 2020-03-08: 0.4 mg via INTRAVENOUS

## 2020-03-08 NOTE — Addendum Note (Signed)
Addended by: Wonda Horner on: 03/08/2020 03:02 PM   Modules accepted: Orders

## 2020-03-09 ENCOUNTER — Encounter: Payer: Self-pay | Admitting: Gastroenterology

## 2020-03-14 ENCOUNTER — Telehealth: Payer: Self-pay

## 2020-03-14 DIAGNOSIS — I340 Nonrheumatic mitral (valve) insufficiency: Secondary | ICD-10-CM

## 2020-03-14 NOTE — Telephone Encounter (Signed)
Spoke to patient Dr.Berry has ordered a echo.Scheduler will call back to schedule.Stated he cannot have done until the last week in May.

## 2020-03-15 DIAGNOSIS — M79645 Pain in left finger(s): Secondary | ICD-10-CM | POA: Insufficient documentation

## 2020-03-16 DIAGNOSIS — M79645 Pain in left finger(s): Secondary | ICD-10-CM | POA: Diagnosis not present

## 2020-03-16 DIAGNOSIS — M1812 Unilateral primary osteoarthritis of first carpometacarpal joint, left hand: Secondary | ICD-10-CM | POA: Diagnosis not present

## 2020-03-18 ENCOUNTER — Other Ambulatory Visit: Payer: Self-pay | Admitting: Internal Medicine

## 2020-03-18 NOTE — Telephone Encounter (Signed)
Please change to OTC Vitamin D3 at 2000 units per day, indefinitely.  

## 2020-03-28 ENCOUNTER — Ambulatory Visit (HOSPITAL_COMMUNITY): Payer: Medicare Other | Attending: Cardiology

## 2020-03-28 ENCOUNTER — Other Ambulatory Visit: Payer: Self-pay

## 2020-03-28 DIAGNOSIS — I340 Nonrheumatic mitral (valve) insufficiency: Secondary | ICD-10-CM

## 2020-04-20 ENCOUNTER — Other Ambulatory Visit: Payer: Self-pay

## 2020-04-20 MED ORDER — AMLODIPINE BESYLATE 10 MG PO TABS: 10.0000 mg | ORAL_TABLET | Freq: Every day | ORAL | 3 refills | Status: DC

## 2020-04-28 ENCOUNTER — Other Ambulatory Visit: Payer: Self-pay | Admitting: Internal Medicine

## 2020-04-28 NOTE — Telephone Encounter (Signed)
Please refill as per office routine med refill policy (all routine meds refilled for 3 mo or monthly per pt preference up to one year from last visit, then month to month grace period for 3 mo, then further med refills will have to be denied)  

## 2020-06-01 ENCOUNTER — Other Ambulatory Visit: Payer: Self-pay | Admitting: Internal Medicine

## 2020-06-01 NOTE — Telephone Encounter (Signed)
Please refill as per office routine med refill policy (all routine meds refilled for 3 mo or monthly per pt preference up to one year from last visit, then month to month grace period for 3 mo, then further med refills will have to be denied)  

## 2020-07-12 ENCOUNTER — Other Ambulatory Visit: Payer: Self-pay | Admitting: Internal Medicine

## 2020-07-18 DIAGNOSIS — Z23 Encounter for immunization: Secondary | ICD-10-CM | POA: Diagnosis not present

## 2020-07-25 ENCOUNTER — Other Ambulatory Visit: Payer: Self-pay | Admitting: Internal Medicine

## 2020-07-27 ENCOUNTER — Other Ambulatory Visit: Payer: Self-pay | Admitting: Internal Medicine

## 2020-08-04 ENCOUNTER — Ambulatory Visit: Payer: Medicare Other | Attending: Internal Medicine

## 2020-08-04 DIAGNOSIS — Z23 Encounter for immunization: Secondary | ICD-10-CM

## 2020-08-04 NOTE — Progress Notes (Signed)
   Covid-19 Vaccination Clinic  Name:  DEVLIN BRINK    MRN: [id number] DOB: 1946/01/07  08/04/2020  Mr. Kirn was observed post Covid-19 immunization for 15 minutes without incident. He was provided with Vaccine Information Sheet and instruction to access the V-Safe system.   Mr. Becraft was instructed to call 911 with any severe reactions post vaccine: Marland Kitchen Difficulty breathing  . Swelling of face and throat  . A fast heartbeat  . A bad rash all over body  . Dizziness and weakness

## 2020-08-11 ENCOUNTER — Telehealth: Payer: Self-pay | Admitting: Cardiovascular Disease

## 2020-08-11 NOTE — Telephone Encounter (Signed)
lvm for patient to return call to get follow up scheduled with Berry from recall list °

## 2020-08-16 ENCOUNTER — Encounter: Payer: Self-pay | Admitting: Internal Medicine

## 2020-08-17 ENCOUNTER — Encounter: Payer: Self-pay | Admitting: Gastroenterology

## 2020-10-04 ENCOUNTER — Ambulatory Visit (AMBULATORY_SURGERY_CENTER): Payer: Self-pay

## 2020-10-04 ENCOUNTER — Other Ambulatory Visit: Payer: Self-pay

## 2020-10-04 VITALS — Ht 74.0 in | Wt 204.0 lb

## 2020-10-04 DIAGNOSIS — Z86010 Personal history of colonic polyps: Secondary | ICD-10-CM

## 2020-10-04 NOTE — Progress Notes (Signed)
No egg or soy allergy known to patient  No issues with past sedation with any surgeries or procedures No intubation problems in the past  No FH of Malignant Hyperthermia No diet pills per patient No home 02 use per patient  No blood thinners per patient  Pt denies issues with constipation  No A fib or A flutter  EMMI video via MyChart  COVID 19 guidelines implemented in PV today with Pt and RN  COVID vaccines completed on 01/2020 per pt;  Due to the COVID-19 pandemic we are asking patients to follow these guidelines. Please only bring one care partner. Please be aware that your care partner may wait in the car in the parking lot or if they feel like they will be too hot to wait in the car, they may wait in the lobby on the 4th floor. All care partners are required to wear a mask the entire time (we do not have any that we can provide them), they need to practice social distancing, and we will do a Covid check for all patient's and care partners when you arrive. Also we will check their temperature and your temperature. If the care partner waits in their car they need to stay in the parking lot the entire time and we will call them on their cell phone when the patient is ready for discharge so they can bring the car to the front of the building. Also all patient's will need to wear a mask into building. 

## 2020-10-15 ENCOUNTER — Encounter: Payer: Self-pay | Admitting: Internal Medicine

## 2020-10-18 ENCOUNTER — Encounter: Payer: Medicare Other | Admitting: Gastroenterology

## 2020-10-18 ENCOUNTER — Other Ambulatory Visit: Payer: Self-pay

## 2020-10-18 MED ORDER — ROSUVASTATIN CALCIUM 20 MG PO TABS: ORAL_TABLET | ORAL | 3 refills | Status: DC

## 2020-10-29 ENCOUNTER — Other Ambulatory Visit: Payer: Self-pay | Admitting: Internal Medicine

## 2020-10-30 NOTE — Telephone Encounter (Signed)
Please refill as per office routine med refill policy (all routine meds refilled for 3 mo or monthly per pt preference up to one year from last visit, then month to month grace period for 3 mo, then further med refills will have to be denied)  

## 2020-11-08 ENCOUNTER — Telehealth: Payer: Self-pay | Admitting: Gastroenterology

## 2020-11-08 ENCOUNTER — Encounter: Payer: Self-pay | Admitting: Internal Medicine

## 2020-11-08 DIAGNOSIS — R509 Fever, unspecified: Secondary | ICD-10-CM

## 2020-11-08 NOTE — Telephone Encounter (Signed)
Thank you for the update. Hopefully all goes well and he does not have Covid. Rovonda, put a recall for 3 months from now in case he does not call back as a reminder to get him scheduled. Thanks. GM

## 2020-11-08 NOTE — Telephone Encounter (Signed)
Pt stated he had a terrible cold and was going to get covid tested.  He cancelled appt and will call back to reschedule.

## 2020-11-10 MED ORDER — GUAIFENESIN-CODEINE 100-10 MG/5ML PO SYRP: 5.0000 mL | ORAL_SOLUTION | Freq: Three times a day (TID) | ORAL | 0 refills | Status: DC | PRN

## 2020-11-11 ENCOUNTER — Encounter: Payer: Medicare Other | Admitting: Gastroenterology

## 2020-11-13 DIAGNOSIS — Z1152 Encounter for screening for COVID-19: Secondary | ICD-10-CM | POA: Diagnosis not present

## 2020-11-14 ENCOUNTER — Other Ambulatory Visit: Payer: Self-pay

## 2020-11-14 ENCOUNTER — Ambulatory Visit (INDEPENDENT_AMBULATORY_CARE_PROVIDER_SITE_OTHER)
Admission: RE | Admit: 2020-11-14 | Discharge: 2020-11-14 | Disposition: A | Discharge status: Home / Self Care | Payer: Medicare Other | Source: Ambulatory Visit | Attending: Internal Medicine | Admitting: Internal Medicine

## 2020-11-14 ENCOUNTER — Other Ambulatory Visit (INDEPENDENT_AMBULATORY_CARE_PROVIDER_SITE_OTHER): Payer: Medicare Other

## 2020-11-14 DIAGNOSIS — R509 Fever, unspecified: Secondary | ICD-10-CM

## 2020-11-14 LAB — BASIC METABOLIC PANEL
BUN: 16 mg/dL (ref 6–23)
CO2: 28 mEq/L (ref 19–32)
Calcium: 9.6 mg/dL (ref 8.4–10.5)
Chloride: 103 mEq/L (ref 96–112)
Creatinine, Ser: 1.06 mg/dL (ref 0.40–1.50)
GFR: 70.31 mL/min (ref >60.00)
Glucose, Bld: 112 mg/dL — ABNORMAL HIGH (ref 70–99)
Potassium: 3.8 mEq/L (ref 3.5–5.1)
Sodium: 139 mEq/L (ref 135–145)

## 2020-11-14 LAB — URINALYSIS, ROUTINE W REFLEX MICROSCOPIC
Bilirubin Urine: NEGATIVE
Hgb urine dipstick: NEGATIVE
Ketones, ur: NEGATIVE
Leukocytes,Ua: NEGATIVE
Nitrite: NEGATIVE
RBC / HPF: NONE SEEN (ref 0–?)
Specific Gravity, Urine: 1.015 (ref 1.000–1.030)
Total Protein, Urine: NEGATIVE
Urine Glucose: NEGATIVE
Urobilinogen, UA: 0.2 (ref 0.0–1.0)
WBC, UA: NONE SEEN (ref 0–?)
pH: 7 (ref 5.0–8.0)

## 2020-11-14 LAB — CBC WITH DIFFERENTIAL/PLATELET
Basophils Absolute: 0.1 10*3/uL (ref 0.0–0.1)
Basophils Relative: 1 % (ref 0.0–3.0)
Eosinophils Absolute: 0.2 10*3/uL (ref 0.0–0.7)
Eosinophils Relative: 1.8 % (ref 0.0–5.0)
HCT: 44.2 % (ref 39.0–52.0)
Hemoglobin: 14.9 g/dL (ref 13.0–17.0)
Lymphocytes Relative: 20.6 % (ref 12.0–46.0)
Lymphs Abs: 1.7 10*3/uL (ref 0.7–4.0)
MCHC: 33.8 g/dL (ref 30.0–36.0)
MCV: 87.7 fl (ref 78.0–100.0)
Monocytes Absolute: 0.5 10*3/uL (ref 0.1–1.0)
Monocytes Relative: 5.5 % (ref 3.0–12.0)
Neutro Abs: 6 10*3/uL (ref 1.4–7.7)
Neutrophils Relative %: 71.1 % (ref 43.0–77.0)
Platelets: 226 10*3/uL (ref 150.0–400.0)
RBC: 5.04 Mil/uL (ref 4.22–5.81)
RDW: 12.8 % (ref 11.5–15.5)
WBC: 8.4 10*3/uL (ref 4.0–10.5)

## 2020-11-14 LAB — HEPATIC FUNCTION PANEL
ALT: 16 U/L (ref 0–53)
AST: 18 U/L (ref 0–37)
Albumin: 4.7 g/dL (ref 3.5–5.2)
Alkaline Phosphatase: 40 U/L (ref 39–117)
Bilirubin, Direct: 0.2 mg/dL (ref 0.0–0.3)
Total Bilirubin: 0.7 mg/dL (ref 0.2–1.2)
Total Protein: 8 g/dL (ref 6.0–8.3)

## 2020-11-14 NOTE — Addendum Note (Signed)
Addended by: Biagio Borg on: 11/14/2020 01:24 PM   Modules accepted: Orders

## 2020-11-15 ENCOUNTER — Telehealth (INDEPENDENT_AMBULATORY_CARE_PROVIDER_SITE_OTHER): Payer: Medicare Other | Admitting: Internal Medicine

## 2020-11-15 DIAGNOSIS — J309 Allergic rhinitis, unspecified: Secondary | ICD-10-CM

## 2020-11-15 DIAGNOSIS — R739 Hyperglycemia, unspecified: Secondary | ICD-10-CM | POA: Diagnosis not present

## 2020-11-15 DIAGNOSIS — B349 Viral infection, unspecified: Secondary | ICD-10-CM | POA: Diagnosis not present

## 2020-11-15 LAB — URINE CULTURE
MICRO NUMBER:: 11399447
Result:: NO GROWTH
SPECIMEN QUALITY:: ADEQUATE

## 2020-11-15 NOTE — Progress Notes (Signed)
Patient ID: Oscar Dixon, male   DOB: 02/25/1946, 75 y.o.   MRN: [id number]  Virtual Visit via Video Note  I connected with Oscar Dixon on 11/20/20 at  3:20 PM EST by a video enabled telemedicine application and verified that I am speaking with the correct person using two identifiers.  Location of all participants today Patient: at home Provider: at office   I discussed the limitations of evaluation and management by telemedicine and the availability of in person appointments. The patient expressed understanding and agreed to proceed.  History of Present Illness:  Here with 2 wks acute onset fever, facial pain, pressure, headache, general weakness and malaise, and clear d/c, with mild ST and cough, but pt denies chest pain, wheezing, increased sob or doe, orthopnea, PND, increased LE swelling, palpitations, dizziness or syncope.  COVID neg on 2 home testing,   CXR and WBC normal yesterday.  Also with fatigue, but appetite ok, and n/v/d.  Tongue is a bit white.  Pt denies chest pain, increased sob or doe, wheezing, orthopnea, PND, increased LE swelling, palpitations, dizziness or syncope.   Pt denies polydipsia, polyuria  Allergy symptoms have been well controlled recently on current tx Past Medical History:  Diagnosis Date  . 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  . Arthritis    LEFT hand  . CARPAL TUNNEL SYNDROME, BILATERAL 12/15/2009  . Cataract    bilateral sx  . DYSPNEA ON EXERTION 12/15/2009  . Erectile dysfunction 04/12/2012  . FATIGUE 03/27/2010  . GERD 05/20/2007   on meds  . GOUT 03/27/2010  . HAND PAIN 12/15/2009  . Headache(784.0) 08/25/2009  . HERNIA, UMBILICAL 6/43/3295  . HYPERLIPIDEMIA 07/09/2008   on meds  . HYPERTENSION 05/20/2007   on meds  . LIBIDO, DECREASED 03/27/2010  . Mitral regurgitation 09/12/2011  . 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 ANKLE SURGERY Left 1967  . CATARACT EXTRACTION Bilateral   . COLONOSCOPY   2016   TA's -suprep (exc)  . ELBOW SURGERY Right 1970  . POLYPECTOMY  2016   TA's   . s/p basal cell cancer    . UMBILICAL HERNIA REPAIR      reports that he has quit smoking. He has never used smokeless tobacco. He reports current alcohol use of about 7.0 standard drinks of alcohol per week. He reports that he does not use drugs. family history includes Alcohol abuse in his mother; Hypertension in his father and mother; Lung cancer (age of onset: 62) in his brother; Stroke (age of onset: 92) in his father. Allergies  Allergen Reactions  . Augmentin [Amoxicillin-Pot Clavulanate] Diarrhea    diarrhea   Current Outpatient Medications on File Prior to Visit  Medication Sig Dispense Refill  . albuterol (VENTOLIN HFA) 108 (90 Base) MCG/ACT inhaler INHALE 2 PUFFS INTO THE LUNGS EVERY 6 HOURS AS NEEDED FOR WHEEZING OR SHORTNESS OF BREATH 54 g 3  . amLODipine (NORVASC) 10 MG tablet TAKE 1 TABLET(10 MG) BY MOUTH DAILY 30 tablet 3  . aspirin 81 MG EC tablet Take 81 mg by mouth daily.      . benazepril (LOTENSIN) 40 MG tablet Take 1 tablet (40 mg total) by mouth daily. 90 tablet 3  . Cholecalciferol (VITAMIN D3 PO) Take 2,000 Units by mouth daily.    . diclofenac sodium (VOLTAREN) 1 % GEL APPLY 4 GRAMS TOPICALLY FOUR TIMES A DAY AS NEEDED FOR PAIN 100 g 10  . esomeprazole (NEXIUM) 40  MG capsule TAKE 1 CAPSULE BY MOUTH EVERY DAY AT NOON 90 capsule 0  . guaiFENesin-codeine (ROBITUSSIN AC) 100-10 MG/5ML syrup Take 5 mLs by mouth 3 (three) times daily as needed for cough. 180 mL 0  . rosuvastatin (CRESTOR) 20 MG tablet TAKE 1 TABLET(20 MG) BY MOUTH DAILY 90 tablet 3  . zolpidem (AMBIEN) 5 MG tablet TAKE 1 TABLET BY MOUTH AT BEDTIME AS NEEDED FOR SLEEP 90 tablet 1   No current facility-administered medications on file prior to visit.    Observations/Objective: Alert, NAD, appropriate mood and affect, resps normal, cn 2-12 intact, moves all 4s, no visible rash or swelling Lab Results  Component Value  Date   WBC 8.4 11/14/2020   HGB 14.9 11/14/2020   HCT 44.2 11/14/2020   PLT 226.0 11/14/2020   GLUCOSE 112 (H) 11/14/2020   CHOL 139 12/21/2019   TRIG 82.0 12/21/2019   HDL 57.40 12/21/2019   LDLCALC 65 12/21/2019   ALT 16 11/14/2020   AST 18 11/14/2020   NA 139 11/14/2020   K 3.8 11/14/2020   CL 103 11/14/2020   CREATININE 1.06 11/14/2020   BUN 16 11/14/2020   CO2 28 11/14/2020   TSH 1.22 12/21/2019   PSA 2.29 12/21/2019   HGBA1C 6.2 12/21/2019   CXR - 11/14/20 - summary IMPRESSION: Negative chest x-ray.  Assessment and Plan: See notes  Follow Up Instructions: See notes   I discussed the assessment and treatment plan with the patient. The patient was provided an opportunity to ask questions and all were answered. The patient agreed with the plan and demonstrated an understanding of the instructions.   The patient was advised to call back or seek an in-person evaluation if the symptoms worsen or if the condition fails to improve as anticipated.   Cathlean Cower, MD

## 2020-11-17 NOTE — Telephone Encounter (Signed)
New recall has been placed as requested. Placed for 01-03-21

## 2020-11-20 ENCOUNTER — Encounter: Payer: Self-pay | Admitting: Internal Medicine

## 2020-11-20 DIAGNOSIS — B349 Viral infection, unspecified: Secondary | ICD-10-CM | POA: Insufficient documentation

## 2020-11-20 DIAGNOSIS — J309 Allergic rhinitis, unspecified: Secondary | ICD-10-CM | POA: Insufficient documentation

## 2020-11-20 NOTE — Patient Instructions (Signed)
Please take all new medication as prescribed 

## 2020-11-20 NOTE — Assessment & Plan Note (Signed)
Mild, well controlled, cont otc antihistamine and/or nasacort

## 2020-11-20 NOTE — Assessment & Plan Note (Signed)
Lab Results  Component Value Date   HGBA1C 6.2 12/21/2019   Stable, pt to continue current medical treatment  - diet

## 2020-11-20 NOTE — Assessment & Plan Note (Signed)
C/w acute viral illness, cxr and wbc neg, afeb and symptoms not worsening, for otc symptomatic meds, rest, fluids, tylenol prn

## 2020-11-29 ENCOUNTER — Other Ambulatory Visit: Payer: Self-pay | Admitting: Internal Medicine

## 2020-11-29 NOTE — Telephone Encounter (Signed)
Please refill as per office routine med refill policy (all routine meds refilled for 3 mo or monthly per pt preference up to one year from last visit, then month to month grace period for 3 mo, then further med refills will have to be denied)  

## 2020-12-09 DIAGNOSIS — H52203 Unspecified astigmatism, bilateral: Secondary | ICD-10-CM | POA: Diagnosis not present

## 2020-12-09 DIAGNOSIS — Z961 Presence of intraocular lens: Secondary | ICD-10-CM | POA: Diagnosis not present

## 2020-12-20 ENCOUNTER — Other Ambulatory Visit: Payer: Self-pay

## 2020-12-21 ENCOUNTER — Encounter: Payer: Self-pay | Admitting: Internal Medicine

## 2020-12-21 ENCOUNTER — Ambulatory Visit (INDEPENDENT_AMBULATORY_CARE_PROVIDER_SITE_OTHER): Payer: Medicare Other | Admitting: Internal Medicine

## 2020-12-21 ENCOUNTER — Other Ambulatory Visit: Payer: Self-pay

## 2020-12-21 VITALS — BP 132/74 | HR 65 | Temp 98.0°F | Ht 74.0 in | Wt 203.0 lb

## 2020-12-21 DIAGNOSIS — E538 Deficiency of other specified B group vitamins: Secondary | ICD-10-CM

## 2020-12-21 DIAGNOSIS — I10 Essential (primary) hypertension: Secondary | ICD-10-CM

## 2020-12-21 DIAGNOSIS — R739 Hyperglycemia, unspecified: Secondary | ICD-10-CM | POA: Diagnosis not present

## 2020-12-21 DIAGNOSIS — I700 Atherosclerosis of aorta: Secondary | ICD-10-CM

## 2020-12-21 DIAGNOSIS — R972 Elevated prostate specific antigen [PSA]: Secondary | ICD-10-CM | POA: Diagnosis not present

## 2020-12-21 DIAGNOSIS — E782 Mixed hyperlipidemia: Secondary | ICD-10-CM | POA: Diagnosis not present

## 2020-12-21 DIAGNOSIS — M109 Gout, unspecified: Secondary | ICD-10-CM

## 2020-12-21 DIAGNOSIS — E559 Vitamin D deficiency, unspecified: Secondary | ICD-10-CM | POA: Diagnosis not present

## 2020-12-21 LAB — URIC ACID: Uric Acid, Serum: 6.8 mg/dL (ref 4.0–7.8)

## 2020-12-21 LAB — LIPID PANEL
Cholesterol: 132 mg/dL (ref 0–200)
HDL: 63.5 mg/dL (ref >39.00)
LDL Cholesterol: 56 mg/dL (ref 0–99)
NonHDL: 68.12
Total CHOL/HDL Ratio: 2
Triglycerides: 59 mg/dL (ref 0.0–149.0)
VLDL: 11.8 mg/dL (ref 0.0–40.0)

## 2020-12-21 LAB — PSA: PSA: 2.61 ng/mL (ref 0.10–4.00)

## 2020-12-21 LAB — VITAMIN B12: Vitamin B-12: 284 pg/mL (ref 211–911)

## 2020-12-21 LAB — VITAMIN D 25 HYDROXY (VIT D DEFICIENCY, FRACTURES): VITD: 40.35 ng/mL (ref 30.00–100.00)

## 2020-12-21 LAB — HEMOGLOBIN A1C: Hgb A1c MFr Bld: 5.9 % (ref 4.6–6.5)

## 2020-12-21 LAB — TSH: TSH: 1.17 u[IU]/mL (ref 0.35–4.50)

## 2020-12-21 NOTE — Patient Instructions (Signed)

## 2020-12-21 NOTE — Progress Notes (Signed)
Patient ID: Oscar Dixon, male   DOB: Jul 12, 1946, 75 y.o.   MRN: [id number]         Chief Complaint:: yearly exam       HPI:  Oscar Dixon is a 75 y.o. male here for yearly exam; due for colnoscopy feb 2023; no new complaints.   Pt denies polydipsia, polyuria, .  Pt states overall good compliance with meds, trying to follow lower cholesterol diet, wt overall stable, and trying to stay more active.  BP at home < 140/90.  Last seen at video visit approx 1 mo ago; covid neg x 2, all resolved 2 days post visit.  Pt has colonoscopy planned for that time, but had to cancel, and will call to reschedule.  .Now taking Vit d.  Did already have some labs done earlier this month.  Pt denies chest pain, increased sob or doe, wheezing, orthopnea, PND, increased LE swelling, palpitations, dizziness or syncope.  Denies focal neuro s/s.    Wt Readings from Last 3 Encounters:  12/21/20 203 lb (92.1 kg)  10/04/20 204 lb (92.5 kg)  03/08/20 204 lb (92.5 kg)   BP Readings from Last 3 Encounters:  12/21/20 132/74  02/24/20 136/70  12/21/19 (!) 144/80   Immunization History  Administered Date(s) Administered  . Influenza Split 09/18/2012  . Influenza Whole 06/30/2010  . Influenza, High Dose Seasonal PF 07/26/2014, 07/19/2015, 07/20/2015, 06/15/2019  . Influenza,inj,Quad PF,6+ Mos 09/18/2013  . Influenza-Unspecified 08/10/2016, 07/23/2017, 08/05/2018  . PFIZER(Purple Top)SARS-COV-2 Vaccination 12/11/2019, 01/05/2020, 08/04/2020  . Pneumococcal Conjugate-13 10/27/2013  . Pneumococcal Polysaccharide-23 09/22/2014  . Td 05/06/2006  . Tdap 10/02/2016  . Zoster 06/24/2009  . Zoster Recombinat (Shingrix) 08/13/2017, 12/26/2017   There are no preventive care reminders to display for this patient.    Past Medical History:  Diagnosis Date  . 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  . Arthritis    LEFT hand  . CARPAL TUNNEL SYNDROME, BILATERAL 12/15/2009  . Cataract    bilateral sx  . DYSPNEA ON EXERTION 12/15/2009  .  Erectile dysfunction 04/12/2012  . FATIGUE 03/27/2010  . GERD 05/20/2007   on meds  . GOUT 03/27/2010  . HAND PAIN 12/15/2009  . Headache(784.0) 08/25/2009  . HERNIA, UMBILICAL 02/21/6221  . HYPERLIPIDEMIA 07/09/2008   on meds  . HYPERTENSION 05/20/2007   on meds  . LIBIDO, DECREASED 03/27/2010  . Mitral regurgitation 09/12/2011  . 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 ANKLE SURGERY Left 1967  . CATARACT EXTRACTION Bilateral   . COLONOSCOPY  2016   TA's -suprep (exc)  . ELBOW SURGERY Right 1970  . POLYPECTOMY  2016   TA's   . s/p basal cell cancer    . UMBILICAL HERNIA REPAIR      reports that he has quit smoking. He has never used smokeless tobacco. He reports current alcohol use of about 7.0 standard drinks of alcohol per week. He reports that he does not use drugs. family history includes Alcohol abuse in his mother; Hypertension in his father and mother; Lung cancer (age of onset: 74) in his brother; Stroke (age of onset: 52) in his father. Allergies  Allergen Reactions  . Augmentin [Amoxicillin-Pot Clavulanate] Diarrhea    diarrhea   Current Outpatient Medications on File Prior to Visit  Medication Sig Dispense Refill  . albuterol (VENTOLIN HFA) 108 (90 Base) MCG/ACT inhaler INHALE 2 PUFFS INTO THE LUNGS EVERY 6 HOURS AS NEEDED FOR WHEEZING OR SHORTNESS  OF BREATH 54 g 3  . amLODipine (NORVASC) 10 MG tablet TAKE 1 TABLET(10 MG) BY MOUTH DAILY 30 tablet 3  . aspirin 81 MG EC tablet Take 81 mg by mouth daily.    . Cholecalciferol (VITAMIN D3 PO) Take 2,000 Units by mouth daily.    . diclofenac sodium (VOLTAREN) 1 % GEL APPLY 4 GRAMS TOPICALLY FOUR TIMES A DAY AS NEEDED FOR PAIN 100 g 10  . esomeprazole (NEXIUM) 40 MG capsule TAKE 1 CAPSULE BY MOUTH EVERY DAY AT NOON 90 capsule 0  . rosuvastatin (CRESTOR) 20 MG tablet TAKE 1 TABLET(20 MG) BY MOUTH DAILY 90 tablet 3  . zolpidem (AMBIEN) 5 MG tablet TAKE 1 TABLET BY  MOUTH AT BEDTIME AS NEEDED FOR SLEEP 90 tablet 1  . guaiFENesin-codeine (ROBITUSSIN AC) 100-10 MG/5ML syrup Take 5 mLs by mouth 3 (three) times daily as needed for cough. (Patient not taking: Reported on 12/21/2020) 180 mL 0   No current facility-administered medications on file prior to visit.        ROS:  All others reviewed and negative.  Objective        PE:  BP 132/74   Pulse 65   Temp 98 F (36.7 C) (Oral)   Ht 6\' 2"  (1.88 m)   Wt 203 lb (92.1 kg)   SpO2 96%   BMI 26.06 kg/m                 Constitutional: Pt appears in NAD               HENT: Head: NCAT.                Right Ear: External ear normal.                 Left Ear: External ear normal.                Eyes: . Pupils are equal, round, and reactive to light. Conjunctivae and EOM are normal               Nose: without d/c or deformity               Neck: Neck supple. Gross normal ROM               Cardiovascular: Normal rate and regular rhythm.                 Pulmonary/Chest: Effort normal and breath sounds without rales or wheezing.                Abd:  Soft, NT, ND, + BS, no organomegaly               Neurological: Pt is alert. At baseline orientation, motor grossly intact               Skin: Skin is warm. No rashes, no other new lesions, LE edema - none               Psychiatric: Pt behavior is normal without agitation   Micro: none  Cardiac tracings I have personally interpreted today:  none  Pertinent Radiological findings (summarize): none   Lab Results  Component Value Date   WBC 8.4 11/14/2020   HGB 14.9 11/14/2020   HCT 44.2 11/14/2020   PLT 226.0 11/14/2020   GLUCOSE 112 (H) 11/14/2020   CHOL 132 12/21/2020   TRIG 59.0 12/21/2020   HDL 63.50 12/21/2020   LDLCALC 56 12/21/2020  ALT 16 11/14/2020   AST 18 11/14/2020   NA 139 11/14/2020   K 3.8 11/14/2020   CL 103 11/14/2020   CREATININE 1.06 11/14/2020   BUN 16 11/14/2020   CO2 28 11/14/2020   TSH 1.17 12/21/2020   PSA 2.61 12/21/2020    HGBA1C 5.9 12/21/2020   Assessment/Plan:  Oscar Dixon is a 75 y.o. White or Caucasian [1] male with  has a past medical history of ADD (07/09/2008), Arthritis, CARPAL TUNNEL SYNDROME, BILATERAL (12/15/2009), Cataract, DYSPNEA ON EXERTION (12/15/2009), Erectile dysfunction (04/12/2012), FATIGUE (03/27/2010), GERD (05/20/2007), GOUT (03/27/2010), HAND PAIN (12/15/2009), Headache(784.0) (53/61/4431), HERNIA, UMBILICAL (5/40/0867), HYPERLIPIDEMIA (07/09/2008), HYPERTENSION (05/20/2007), LIBIDO, DECREASED (03/27/2010), Mitral regurgitation (09/12/2011), and Other specified forms of hearing loss (06/30/2010).  Aortic atherosclerosis ([place]) To contine low chol diet, and crestor 20 mg   Essential hypertension BP Readings from Last 3 Encounters:  12/21/20 132/74  02/24/20 136/70  12/21/19 (!) 144/80   Stable, pt to continue medical treatment - norvasc, lotensin   GOUT No recent symptos, for uric acid with labs  Hyperglycemia Lab Results  Component Value Date   HGBA1C 5.9 12/21/2020   Stable, pt to continue current medical treatment  - diet   Hyperlipidemia Lab Results  Component Value Date   LDLCALC 56 12/21/2020   Stable, pt to continue current statin crestor 20   Current Outpatient Medications (Cardiovascular):  .  amLODipine (NORVASC) 10 MG tablet, TAKE 1 TABLET(10 MG) BY MOUTH DAILY .  rosuvastatin (CRESTOR) 20 MG tablet, TAKE 1 TABLET(20 MG) BY MOUTH DAILY .  benazepril (LOTENSIN) 40 MG tablet, Take 1 tablet (40 mg total) by mouth daily.  Current Outpatient Medications (Respiratory):  .  albuterol (VENTOLIN HFA) 108 (90 Base) MCG/ACT inhaler, INHALE 2 PUFFS INTO THE LUNGS EVERY 6 HOURS AS NEEDED FOR WHEEZING OR SHORTNESS OF BREATH .  guaiFENesin-codeine (ROBITUSSIN AC) 100-10 MG/5ML syrup, Take 5 mLs by mouth 3 (three) times daily as needed for cough. (Patient not taking: Reported on 12/21/2020)  Current Outpatient Medications (Analgesics):  .  aspirin 81 MG EC tablet, Take 81 mg  by mouth daily.   Current Outpatient Medications (Other):  Marland Kitchen  Cholecalciferol (VITAMIN D3 PO), Take 2,000 Units by mouth daily. .  diclofenac sodium (VOLTAREN) 1 % GEL, APPLY 4 GRAMS TOPICALLY FOUR TIMES A DAY AS NEEDED FOR PAIN .  esomeprazole (NEXIUM) 40 MG capsule, TAKE 1 CAPSULE BY MOUTH EVERY DAY AT NOON .  zolpidem (AMBIEN) 5 MG tablet, TAKE 1 TABLET BY MOUTH AT BEDTIME AS NEEDED FOR SLEEP   Vitamin D deficiency Last vitamin D Lab Results  Component Value Date   VD25OH 40.35 12/21/2020   Stable, cont oral replacement  Followup: Return in about 1 year (around 12/21/2021).  Cathlean Cower, MD 12/25/2020 7:20 PM [facility]

## 2020-12-23 ENCOUNTER — Encounter: Payer: Self-pay | Admitting: Internal Medicine

## 2020-12-23 MED ORDER — BENAZEPRIL HCL 40 MG PO TABS: 40.0000 mg | ORAL_TABLET | Freq: Every day | ORAL | 3 refills | Status: DC

## 2020-12-25 ENCOUNTER — Encounter: Payer: Self-pay | Admitting: Internal Medicine

## 2020-12-25 NOTE — Assessment & Plan Note (Signed)
Last vitamin D Lab Results  Component Value Date   VD25OH 40.35 12/21/2020   Stable, cont oral replacement  

## 2020-12-25 NOTE — Assessment & Plan Note (Signed)
Lab Results  Component Value Date   LDLCALC 56 12/21/2020   Stable, pt to continue current statin crestor 20   Current Outpatient Medications (Cardiovascular):  .  amLODipine (NORVASC) 10 MG tablet, TAKE 1 TABLET(10 MG) BY MOUTH DAILY .  rosuvastatin (CRESTOR) 20 MG tablet, TAKE 1 TABLET(20 MG) BY MOUTH DAILY .  benazepril (LOTENSIN) 40 MG tablet, Take 1 tablet (40 mg total) by mouth daily.  Current Outpatient Medications (Respiratory):  .  albuterol (VENTOLIN HFA) 108 (90 Base) MCG/ACT inhaler, INHALE 2 PUFFS INTO THE LUNGS EVERY 6 HOURS AS NEEDED FOR WHEEZING OR SHORTNESS OF BREATH .  guaiFENesin-codeine (ROBITUSSIN AC) 100-10 MG/5ML syrup, Take 5 mLs by mouth 3 (three) times daily as needed for cough. (Patient not taking: Reported on 12/21/2020)  Current Outpatient Medications (Analgesics):  .  aspirin 81 MG EC tablet, Take 81 mg by mouth daily.   Current Outpatient Medications (Other):  Marland Kitchen  Cholecalciferol (VITAMIN D3 PO), Take 2,000 Units by mouth daily. .  diclofenac sodium (VOLTAREN) 1 % GEL, APPLY 4 GRAMS TOPICALLY FOUR TIMES A DAY AS NEEDED FOR PAIN .  esomeprazole (NEXIUM) 40 MG capsule, TAKE 1 CAPSULE BY MOUTH EVERY DAY AT NOON .  zolpidem (AMBIEN) 5 MG tablet, TAKE 1 TABLET BY MOUTH AT BEDTIME AS NEEDED FOR SLEEP

## 2020-12-25 NOTE — Assessment & Plan Note (Signed)
No recent symptos, for uric acid with labs

## 2020-12-25 NOTE — Assessment & Plan Note (Signed)
To contine low chol diet, and crestor 20 mg

## 2020-12-25 NOTE — Assessment & Plan Note (Signed)
BP Readings from Last 3 Encounters:  12/21/20 132/74  02/24/20 136/70  12/21/19 (!) 144/80   Stable, pt to continue medical treatment - norvasc, lotensin

## 2020-12-25 NOTE — Assessment & Plan Note (Signed)
Lab Results  Component Value Date   HGBA1C 5.9 12/21/2020   Stable, pt to continue current medical treatment  - diet

## 2021-01-09 ENCOUNTER — Other Ambulatory Visit: Payer: Self-pay | Admitting: Internal Medicine

## 2021-01-09 ENCOUNTER — Encounter: Payer: Self-pay | Admitting: Internal Medicine

## 2021-01-09 MED ORDER — ZOLPIDEM TARTRATE 5 MG PO TABS: 5.0000 mg | ORAL_TABLET | Freq: Every evening | ORAL | 1 refills | Status: DC | PRN

## 2021-01-12 ENCOUNTER — Encounter: Payer: Self-pay | Admitting: Gastroenterology

## 2021-03-07 ENCOUNTER — Telehealth: Payer: Self-pay

## 2021-03-07 NOTE — Telephone Encounter (Signed)
Left pt a voicemail to call office back to schedule a AWV

## 2021-03-14 ENCOUNTER — Other Ambulatory Visit: Payer: Self-pay

## 2021-03-14 ENCOUNTER — Ambulatory Visit (AMBULATORY_SURGERY_CENTER): Payer: Self-pay | Admitting: *Deleted

## 2021-03-14 VITALS — Ht 74.0 in | Wt 205.0 lb

## 2021-03-14 DIAGNOSIS — Z86010 Personal history of colonic polyps: Secondary | ICD-10-CM

## 2021-03-14 NOTE — Progress Notes (Signed)

## 2021-03-28 ENCOUNTER — Other Ambulatory Visit: Payer: Self-pay

## 2021-03-28 ENCOUNTER — Encounter: Payer: Self-pay | Admitting: Gastroenterology

## 2021-03-28 ENCOUNTER — Ambulatory Visit (AMBULATORY_SURGERY_CENTER): Payer: Medicare Other | Admitting: Gastroenterology

## 2021-03-28 VITALS — BP 108/75 | HR 63 | Temp 98.4°F | Resp 20 | Ht 74.0 in | Wt 205.0 lb

## 2021-03-28 DIAGNOSIS — D120 Benign neoplasm of cecum: Secondary | ICD-10-CM

## 2021-03-28 DIAGNOSIS — D124 Benign neoplasm of descending colon: Secondary | ICD-10-CM

## 2021-03-28 DIAGNOSIS — Z86010 Personal history of colonic polyps: Secondary | ICD-10-CM

## 2021-03-28 DIAGNOSIS — D123 Benign neoplasm of transverse colon: Secondary | ICD-10-CM

## 2021-03-28 MED ORDER — SODIUM CHLORIDE 0.9 % IV SOLN: 500.0000 mL | Freq: Once | INTRAVENOUS | Status: DC

## 2021-03-28 NOTE — Op Note (Signed)
[facility] Patient Name: Oscar Dixon Procedure Date: 03/28/2021 1:37 PM MRN: [id number] Endoscopist: Justice Britain , MD Age: 75 Referring MD:  Date of Birth: 08-14-1946 Gender: Male Account #: [id number] Procedure:                Colonoscopy Indications:              Surveillance: Personal history of adenomatous                            polyps on last colonoscopy > 5 years ago Medicines:                Monitored Anesthesia Care Procedure:                Pre-Anesthesia Assessment:                           - Prior to the procedure, a History and Physical                            was performed, and patient medications and                            allergies were reviewed. The patient's tolerance of                            previous anesthesia was also reviewed. The risks                            and benefits of the procedure and the sedation                            options and risks were discussed with the patient.                            All questions were answered, and informed consent                            was obtained. Prior Anticoagulants: The patient has                            taken no previous anticoagulant or antiplatelet                            agents except for aspirin. ASA Grade Assessment: II                            - A patient with mild systemic disease. After                            reviewing the risks and benefits, the patient was                            deemed in satisfactory condition to undergo the  procedure.                           After obtaining informed consent, the colonoscope                            was passed under direct vision. Throughout the                            procedure, the patient's blood pressure, pulse, and                            oxygen saturations were monitored continuously. The                            Olympus CF-[id number] (#[id number]) Colonoscope was                             introduced through the anus and advanced to the 5                            cm into the ileum. The colonoscopy was performed                            without difficulty. The patient tolerated the                            procedure. The quality of the bowel preparation was                            good. The terminal ileum, ileocecal valve,                            appendiceal orifice, and rectum were photographed. Scope In: 1:43:47 PM Scope Out: 1:58:31 PM Scope Withdrawal Time: 0 hours 12 minutes 31 seconds  Total Procedure Duration: 0 hours 14 minutes 44 seconds  Findings:                 The digital rectal exam findings include                            hemorrhoids. Pertinent negatives include no                            palpable rectal lesions.                           The terminal ileum and ileocecal valve appeared                            normal.                           Four sessile polyps were found in the descending  colon (1), transverse colon (2) and cecum (1). The                            polyps were 4 to 6 mm in size. These polyps were                            removed with a cold snare. Resection and retrieval                            were complete.                           A few small-mouthed diverticula were found in the                            sigmoid colon.                           Normal mucosa was found in the entire colon                            otherwise.                           Non-bleeding non-thrombosed internal hemorrhoids                            were found during retroflexion, during perianal                            exam and during digital exam. The hemorrhoids were                            Grade II (internal hemorrhoids that prolapse but                            reduce spontaneously). Complications:            No immediate complications. Estimated Blood Loss:     Estimated  blood loss was minimal. Impression:               - Hemorrhoids found on digital rectal exam.                           - The examined portion of the ileum was normal.                           - Four 4 to 6 mm polyps in the descending colon, in                            the transverse colon and in the cecum, removed with                            a cold snare. Resected and retrieved.                           -  A few diverticula in the sigmoid colon.                           - Normal mucosa in the entire examined colon                            otherwise.                           - Non-bleeding non-thrombosed internal hemorrhoids. Recommendation:           - The patient will be observed post-procedure,                            until all discharge criteria are met.                           - Discharge patient to home.                           - Patient has a contact number available for                            emergencies. The signs and symptoms of potential                            delayed complications were discussed with the                            patient. Return to normal activities tomorrow.                            Written discharge instructions were provided to the                            patient.                           - High fiber diet.                           - Use FiberCon 1-2 tablets PO daily.                           - Continue present medications.                           - Await pathology results.                           - Repeat colonoscopy in likely 3 years for                            surveillance based on pathology results.                           - The findings and recommendations were discussed  with the patient.                           - The findings and recommendations were discussed                            with the patient's family. Justice Britain, MD 03/28/2021 2:09:03 PM

## 2021-03-28 NOTE — Patient Instructions (Signed)
Handouts given on polyps, diverticulosis, and hemorrhoids  YOU HAD AN ENDOSCOPIC PROCEDURE TODAY AT [facility]:   Refer to the procedure report that was given to you for any specific questions about what was found during the examination.  If the procedure report does not answer your questions, please call your gastroenterologist to clarify.  If you requested that your care partner not be given the details of your procedure findings, then the procedure report has been included in a sealed envelope for you to review at your convenience later.  YOU SHOULD EXPECT: Some feelings of bloating in the abdomen. Passage of more gas than usual.  Walking can help get rid of the air that was put into your GI tract during the procedure and reduce the bloating. If you had a lower endoscopy (such as a colonoscopy or flexible sigmoidoscopy) you may notice spotting of blood in your stool or on the toilet paper. If you underwent a bowel prep for your procedure, you may not have a normal bowel movement for a few days.  Please Note:  You might notice some irritation and congestion in your nose or some drainage.  This is from the oxygen used during your procedure.  There is no need for concern and it should clear up in a day or so.  SYMPTOMS TO REPORT IMMEDIATELY:   Following lower endoscopy (colonoscopy or flexible sigmoidoscopy):  Excessive amounts of blood in the stool  Significant tenderness or worsening of abdominal pains  Swelling of the abdomen that is new, acute  Fever of 100F or higher   For urgent or emergent issues, a gastroenterologist can be reached at any hour by calling [phone]. Do not use MyChart messaging for urgent concerns.    DIET:  We do recommend a small meal at first, but then you may proceed to your regular diet.  Drink plenty of fluids but you should avoid alcoholic beverages for 24 hours.  ACTIVITY:  You should plan to take it easy for the rest of today and you  should NOT DRIVE or use heavy machinery until tomorrow (because of the sedation medicines used during the test).    FOLLOW UP: Our staff will call the number listed on your records 48-72 hours following your procedure to check on you and address any questions or concerns that you may have regarding the information given to you following your procedure. If we do not reach you, we will leave a message.  We will attempt to reach you two times.  During this call, we will ask if you have developed any symptoms of COVID 19. If you develop any symptoms (ie: fever, flu-like symptoms, shortness of breath, cough etc.) before then, please call [phone].  If you test positive for Covid 19 in the 2 weeks post procedure, please call and report this information to [place].    If any biopsies were taken you will be contacted by phone or by letter within the next 1-3 weeks.  Please call us at [phone] if you have not heard about the biopsies in 3 weeks.    SIGNATURES/CONFIDENTIALITY: You and/or your care partner have signed paperwork which will be entered into your electronic medical record.  These signatures attest to the fact that that the information above on your After Visit Summary has been reviewed and is understood.  Full responsibility of the confidentiality of this discharge information lies with you and/or your care-partner.

## 2021-03-28 NOTE — Progress Notes (Signed)
Called to room to assist during endoscopic procedure.  Patient ID and intended procedure confirmed with present staff. Received instructions for my participation in the procedure from the performing physician.  

## 2021-03-28 NOTE — Progress Notes (Signed)
PT taken to PACU. Monitors in place. VSS. Report given to RN. 

## 2021-03-28 NOTE — Progress Notes (Signed)
VS-CW  Pt's states no medical or surgical changes since previsit or office visit.  

## 2021-03-30 ENCOUNTER — Telehealth: Payer: Self-pay

## 2021-03-30 NOTE — Telephone Encounter (Signed)
  Follow up Call-  Call back number 03/28/2021  Post procedure Call Back phone  # [phone]  Permission to leave phone message Yes  Some recent data might be hidden     Patient questions:  Do you have a fever, pain , or abdominal swelling? No. Pain Score  0 *  Have you tolerated food without any problems? Yes.    Have you been able to return to your normal activities? Yes.    Do you have any questions about your discharge instructions: Diet   No. Medications  No. Follow up visit  No.  Do you have questions or concerns about your Care? No.  Actions: * If pain score is 4 or above: No action needed, pain <4.  1. Have you developed a fever since your procedure? No   2.   Have you had an respiratory symptoms (SOB or cough) since your procedure? No   3.   Have you tested positive for COVID 19 since your procedure no   4.   Have you had any family members/close contacts diagnosed with the COVID 19 since your procedure?  No    If yes to any of these questions please route to Joylene John, RN and Joella Prince, RN

## 2021-04-05 ENCOUNTER — Encounter: Payer: Self-pay | Admitting: Gastroenterology

## 2021-04-13 NOTE — Telephone Encounter (Signed)
Pt declines AWV b/c he believes he is healthy & "will keep staying the course"

## 2021-05-24 ENCOUNTER — Other Ambulatory Visit: Payer: Self-pay

## 2021-05-24 ENCOUNTER — Encounter: Payer: Self-pay | Admitting: Internal Medicine

## 2021-05-24 MED ORDER — AMLODIPINE BESYLATE 10 MG PO TABS: ORAL_TABLET | ORAL | 3 refills | Status: DC

## 2021-05-26 ENCOUNTER — Encounter: Payer: Self-pay | Admitting: Internal Medicine

## 2021-05-27 ENCOUNTER — Other Ambulatory Visit: Payer: Self-pay | Admitting: Internal Medicine

## 2021-06-18 ENCOUNTER — Encounter: Payer: Self-pay | Admitting: Gastroenterology

## 2021-06-29 ENCOUNTER — Encounter: Payer: Self-pay | Admitting: Internal Medicine

## 2021-06-29 ENCOUNTER — Ambulatory Visit (INDEPENDENT_AMBULATORY_CARE_PROVIDER_SITE_OTHER): Payer: Medicare Other | Admitting: Internal Medicine

## 2021-06-29 ENCOUNTER — Other Ambulatory Visit: Payer: Self-pay

## 2021-06-29 ENCOUNTER — Ambulatory Visit (INDEPENDENT_AMBULATORY_CARE_PROVIDER_SITE_OTHER): Payer: Medicare Other

## 2021-06-29 VITALS — BP 112/70 | HR 73 | Temp 98.2°F | Ht 74.0 in | Wt 196.0 lb

## 2021-06-29 DIAGNOSIS — E559 Vitamin D deficiency, unspecified: Secondary | ICD-10-CM | POA: Diagnosis not present

## 2021-06-29 DIAGNOSIS — I10 Essential (primary) hypertension: Secondary | ICD-10-CM | POA: Diagnosis not present

## 2021-06-29 DIAGNOSIS — K5900 Constipation, unspecified: Secondary | ICD-10-CM | POA: Diagnosis not present

## 2021-06-29 DIAGNOSIS — D649 Anemia, unspecified: Secondary | ICD-10-CM | POA: Insufficient documentation

## 2021-06-29 DIAGNOSIS — R109 Unspecified abdominal pain: Secondary | ICD-10-CM

## 2021-06-29 DIAGNOSIS — R739 Hyperglycemia, unspecified: Secondary | ICD-10-CM

## 2021-06-29 LAB — HEPATIC FUNCTION PANEL
ALT: 18 U/L (ref 0–53)
AST: 23 U/L (ref 0–37)
Albumin: 4.5 g/dL (ref 3.5–5.2)
Alkaline Phosphatase: 34 U/L — ABNORMAL LOW (ref 39–117)
Bilirubin, Direct: 0.1 mg/dL (ref 0.0–0.3)
Total Bilirubin: 0.5 mg/dL (ref 0.2–1.2)
Total Protein: 7.6 g/dL (ref 6.0–8.3)

## 2021-06-29 LAB — BASIC METABOLIC PANEL
BUN: 17 mg/dL (ref 6–23)
CO2: 28 mEq/L (ref 19–32)
Calcium: 9.7 mg/dL (ref 8.4–10.5)
Chloride: 103 mEq/L (ref 96–112)
Creatinine, Ser: 1.15 mg/dL (ref 0.40–1.50)
GFR: 63.48 mL/min (ref >60.00)
Glucose, Bld: 86 mg/dL (ref 70–99)
Potassium: 4.1 mEq/L (ref 3.5–5.1)
Sodium: 140 mEq/L (ref 135–145)

## 2021-06-29 LAB — CBC WITH DIFFERENTIAL/PLATELET
Basophils Absolute: 0.1 10*3/uL (ref 0.0–0.1)
Basophils Relative: 1.2 % (ref 0.0–3.0)
Eosinophils Absolute: 0.4 10*3/uL (ref 0.0–0.7)
Eosinophils Relative: 5.5 % — ABNORMAL HIGH (ref 0.0–5.0)
HCT: 38.2 % — ABNORMAL LOW (ref 39.0–52.0)
Hemoglobin: 12.3 g/dL — ABNORMAL LOW (ref 13.0–17.0)
Lymphocytes Relative: 25.2 % (ref 12.0–46.0)
Lymphs Abs: 1.8 10*3/uL (ref 0.7–4.0)
MCHC: 32.2 g/dL (ref 30.0–36.0)
MCV: 85.1 fl (ref 78.0–100.0)
Monocytes Absolute: 0.6 10*3/uL (ref 0.1–1.0)
Monocytes Relative: 7.9 % (ref 3.0–12.0)
Neutro Abs: 4.4 10*3/uL (ref 1.4–7.7)
Neutrophils Relative %: 60.2 % (ref 43.0–77.0)
Platelets: 244 10*3/uL (ref 150.0–400.0)
RBC: 4.49 Mil/uL (ref 4.22–5.81)
RDW: 12.9 % (ref 11.5–15.5)
WBC: 7.3 10*3/uL (ref 4.0–10.5)

## 2021-06-29 LAB — URINALYSIS, ROUTINE W REFLEX MICROSCOPIC
Bilirubin Urine: NEGATIVE
Hgb urine dipstick: NEGATIVE
Leukocytes,Ua: NEGATIVE
Nitrite: NEGATIVE
RBC / HPF: NONE SEEN (ref 0–?)
Specific Gravity, Urine: >=1.03 — AB (ref 1.000–1.030)
Total Protein, Urine: NEGATIVE
Urine Glucose: NEGATIVE
Urobilinogen, UA: 0.2 (ref 0.0–1.0)
pH: 6 (ref 5.0–8.0)

## 2021-06-29 LAB — LIPASE: Lipase: 45 U/L (ref 11.0–59.0)

## 2021-06-29 NOTE — Progress Notes (Signed)
Patient ID: Oscar Dixon, male   DOB: 05-May-1946, 75 y.o.   MRN: [id number]        Chief Complaint: follow up abd pain, low vit d, hyperglycemia, htn       HPI:  Oscar Dixon is a 75 y.o. male here with c/o 10 days onset diffuse crampy abd pains, moderate, more or less constant, mid abdomen primarily, no radiation, no n/v, fever and Denies worsening reflux, dysphagia, or blood.  Has been constipated but otc dulcolox not helping.  Discomfort somewhat better to lie down and walking.  No other new complaints, leaving for the beach later today.  Pt denies chest pain, increased sob or doe, wheezing, orthopnea, PND, increased LE swelling, palpitations, dizziness or syncope.   Pt denies polydipsia, polyuria, or new focal neuro s/s.   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   Arthritis    LEFT hand   CARPAL TUNNEL SYNDROME, BILATERAL 12/15/2009   Cataract    bilateral sx   DYSPNEA ON EXERTION 12/15/2009   Erectile dysfunction 04/12/2012   FATIGUE 03/27/2010   GERD 05/20/2007   on meds   GOUT 03/27/2010   HAND PAIN 12/15/2009   Headache(784.0) XX123456   HERNIA, UMBILICAL 123456   HYPERLIPIDEMIA 07/09/2008   on meds   HYPERTENSION 05/20/2007   on meds   LIBIDO, DECREASED 03/27/2010   Mitral regurgitation 09/12/2011   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  He has never used smokeless tobacco. He reports current alcohol use of about 7.0 standard drinks per week. He reports that he does not use drugs. family history includes Alcohol abuse in his mother; Hypertension in his father and mother; Lung cancer (age of onset: 34) in his brother; Stroke (age of onset: 53) in his father. Allergies  Allergen Reactions   Augmentin [Amoxicillin-Pot Clavulanate] Diarrhea    diarrhea   Current Outpatient Medications on File Prior to Visit  Medication Sig Dispense Refill   albuterol (VENTOLIN HFA) 108 (90 Base) MCG/ACT inhaler INHALE 2 PUFFS INTO THE LUNGS EVERY 6 HOURS AS NEEDED FOR WHEEZING OR SHORTNESS OF BREATH 54 g 3   amLODipine (NORVASC) 10 MG tablet TAKE 1 TABLET(10 MG) BY MOUTH DAILY 30 tablet 3   aspirin 81 MG EC tablet Take 81 mg by mouth daily.     benazepril (LOTENSIN) 40 MG tablet Take 1 tablet (40 mg total) by mouth daily. 90 tablet 3   Cholecalciferol (VITAMIN D3 PO) Take 2,000 Units by mouth daily.     diclofenac sodium (VOLTAREN) 1 % GEL APPLY 4 GRAMS TOPICALLY FOUR TIMES A DAY AS NEEDED FOR PAIN 100 g 10   esomeprazole (NEXIUM) 40 MG capsule TAKE 1  CAPSULE BY MOUTH EVERY DAY AT NOON 90 capsule 0   rosuvastatin (CRESTOR) 20 MG tablet TAKE 1 TABLET(20 MG) BY MOUTH DAILY 90 tablet 3   zolpidem (AMBIEN) 5 MG tablet Take 1 tablet (5 mg total) by mouth at bedtime as needed. for sleep 90 tablet 1   No current facility-administered medications on file prior to visit.        ROS:  All others reviewed and negative.  Objective        PE:  BP 112/70 (BP Location: Left Arm, Patient Position: Sitting, Cuff Size: Normal)   Pulse 73   Temp 98.2 F (36.8 C) (Oral)   Ht '6\' 2"'$  (1.88 m)   Wt 196 lb (88.9 kg)   SpO2 97%   BMI 25.16 kg/m                 Constitutional: Pt appears in NAD               HENT: Head: NCAT.                Right Ear: External ear normal.                 Left Ear: External ear normal.                Eyes: . Pupils are equal,  round, and reactive to light. Conjunctivae and EOM are normal               Nose: without d/c or deformity               Neck: Neck supple. Gross normal ROM               Cardiovascular: Normal rate and regular rhythm.                 Pulmonary/Chest: Effort normal and breath sounds without rales or wheezing.                Abd:  Soft, diffuse mild tender, no mass, ND, + BS, no organomegaly               Neurological: Pt is alert. At baseline orientation, motor grossly intact               Skin: Skin is warm. No rashes, no other new lesions, LE edema - none               Psychiatric: Pt behavior is normal without agitation   Micro: none  Cardiac tracings I have personally interpreted today:  none  Pertinent Radiological findings (summarize): none   Lab Results  Component Value Date   WBC 7.3 06/29/2021   HGB 12.3 (L) 06/29/2021   HCT 38.2 (L) 06/29/2021   PLT 244.0 06/29/2021   GLUCOSE 86 06/29/2021   CHOL 132 12/21/2020   TRIG 59.0 12/21/2020   HDL 63.50 12/21/2020   LDLCALC 56 12/21/2020   ALT 18 06/29/2021   AST 23 06/29/2021   NA 140 06/29/2021   K 4.1 06/29/2021   CL 103 06/29/2021   CREATININE 1.15 06/29/2021   BUN 17 06/29/2021   CO2 28 06/29/2021   TSH 1.17 12/21/2020   PSA 2.61 12/21/2020   HGBA1C 5.9 12/21/2020   Assessment/Plan:  Oscar Dixon is a 75 y.o. White or Caucasian [1] male with  has a past medical history of ADD (07/09/2008), Arthritis, CARPAL TUNNEL SYNDROME, BILATERAL (12/15/2009), Cataract, DYSPNEA ON EXERTION (12/15/2009), Erectile dysfunction (04/12/2012),  FATIGUE (03/27/2010), GERD (05/20/2007), GOUT (03/27/2010), HAND PAIN (12/15/2009), Headache(784.0) ([id number]), HERNIA, UMBILICAL (123456), HYPERLIPIDEMIA (07/09/2008), HYPERTENSION (05/20/2007), LIBIDO, DECREASED (03/27/2010), Mitral regurgitation (09/12/2011), and Other specified forms of hearing loss (06/30/2010).  Vitamin D deficiency Last vitamin D Lab Results  Component Value Date   VD25OH  40.35 12/21/2020   Stable, cont oral replacement   Hyperglycemia Lab Results  Component Value Date   HGBA1C 5.9 12/21/2020   Stable, pt to continue current medical treatment  - diet   Essential hypertension BP Readings from Last 3 Encounters:  06/29/21 112/70  03/28/21 108/75  12/21/20 132/74   Stable, pt to continue medical treatment norvasc, lotensin   Abdominal pain Exam most c/w constipation without obstruction, for lab today and xray r/o obstruction, but for miralax daily prn, senakot prn or even mag citrate once weekly prn, and f/u any worsening s/s  Followup: Return if symptoms worsen or fail to improve.  Cathlean Cower, MD 07/02/2021 6:13 PM [facility]

## 2021-06-29 NOTE — Patient Instructions (Signed)
Ok to continue the dulcolox as needed, or consider daily miralax (17 gm per day), or senakot S at bedtime, or even magnesium citrate once per week if needed  We could consider Linzess low dose if needed as well  Please continue all other medications as before, and refills have been done if requested.  Please have the pharmacy call with any other refills you may need.  Please continue your efforts at being more active, low cholesterol diet, and weight control.  Please keep your appointments with your specialists as you may have planned  Please go to the XRAY Department in the first floor for the x-ray testing  Please go to the LAB at the blood drawing area for the tests to be done  You will be contacted by phone if any changes need to be made immediately.  Otherwise, you will receive a letter about your results with an explanation, but please check with MyChart first.  Please remember to sign up for MyChart if you have not done so, as this will be important to you in the future with finding out test results, communicating by private email, and scheduling acute appointments online when needed.  Have Fun at [facility]!

## 2021-07-02 ENCOUNTER — Encounter: Payer: Self-pay | Admitting: Internal Medicine

## 2021-07-02 NOTE — Assessment & Plan Note (Addendum)
Exam most c/w constipation without obstruction, for lab today and xray r/o obstruction, but for miralax daily prn, senakot prn or even mag citrate once weekly prn, and f/u any worsening s/s

## 2021-07-02 NOTE — Assessment & Plan Note (Signed)
BP Readings from Last 3 Encounters:  06/29/21 112/70  03/28/21 108/75  12/21/20 132/74   Stable, pt to continue medical treatment norvasc, lotensin

## 2021-07-02 NOTE — Assessment & Plan Note (Signed)
Last vitamin D Lab Results  Component Value Date   VD25OH 40.35 12/21/2020   Stable, cont oral replacement

## 2021-07-02 NOTE — Assessment & Plan Note (Signed)
Lab Results  Component Value Date   HGBA1C 5.9 12/21/2020   Stable, pt to continue current medical treatment  - diet

## 2021-07-04 ENCOUNTER — Encounter: Payer: Self-pay | Admitting: Internal Medicine

## 2021-07-04 ENCOUNTER — Other Ambulatory Visit: Payer: Self-pay | Admitting: Internal Medicine

## 2021-07-11 ENCOUNTER — Encounter: Payer: Self-pay | Admitting: Internal Medicine

## 2021-07-11 DIAGNOSIS — Z23 Encounter for immunization: Secondary | ICD-10-CM | POA: Diagnosis not present

## 2021-08-01 ENCOUNTER — Encounter: Payer: Self-pay | Admitting: Internal Medicine

## 2021-08-01 DIAGNOSIS — Z23 Encounter for immunization: Secondary | ICD-10-CM | POA: Diagnosis not present

## 2021-08-22 ENCOUNTER — Telehealth: Payer: Self-pay | Admitting: Internal Medicine

## 2021-08-22 NOTE — Telephone Encounter (Signed)
Left message for patient to call me back at [phone] to schedule Medicare Annual Wellness Visit   No hx of AWV eligible as of 09/05/14  Please schedule at anytime with [facility] Advisor if patient calls the office back.    40 Minutes appointment   Any questions, please call me at [phone]

## 2021-08-26 ENCOUNTER — Other Ambulatory Visit: Payer: Self-pay | Admitting: Internal Medicine

## 2021-08-26 NOTE — Telephone Encounter (Signed)
Please refill as per office routine med refill policy (all routine meds to be refilled for 3 mo or monthly (per pt preference) up to one year from last visit, then month to month grace period for 3 mo, then further med refills will have to be denied) ? ?

## 2021-08-29 ENCOUNTER — Other Ambulatory Visit: Payer: Self-pay | Admitting: Internal Medicine

## 2021-08-30 ENCOUNTER — Other Ambulatory Visit: Payer: Self-pay | Admitting: Internal Medicine

## 2021-08-30 ENCOUNTER — Encounter: Payer: Self-pay | Admitting: Internal Medicine

## 2021-08-30 NOTE — Telephone Encounter (Signed)
Please refill as per office routine med refill policy (all routine meds to be refilled for 3 mo or monthly (per pt preference) up to one year from last visit, then month to month grace period for 3 mo, then further med refills will have to be denied) ? ?

## 2021-09-12 ENCOUNTER — Other Ambulatory Visit: Payer: Self-pay | Admitting: Internal Medicine

## 2021-09-12 NOTE — Telephone Encounter (Signed)
Please refill as per office routine med refill policy (all routine meds to be refilled for 3 mo or monthly (per pt preference) up to one year from last visit, then month to month grace period for 3 mo, then further med refills will have to be denied) ? ?

## 2021-10-04 ENCOUNTER — Encounter: Payer: Self-pay | Admitting: Internal Medicine

## 2021-10-04 DIAGNOSIS — H9209 Otalgia, unspecified ear: Secondary | ICD-10-CM

## 2021-10-04 DIAGNOSIS — H6120 Impacted cerumen, unspecified ear: Secondary | ICD-10-CM

## 2021-10-04 MED ORDER — ZOLPIDEM TARTRATE 5 MG PO TABS
5.0000 mg | ORAL_TABLET | Freq: Every evening | ORAL | 1 refills | Status: DC | PRN
Start: 2021-10-04 — End: 2022-04-03

## 2021-10-06 ENCOUNTER — Other Ambulatory Visit: Payer: Self-pay | Admitting: Internal Medicine

## 2021-10-06 NOTE — Telephone Encounter (Signed)
Please refill as per office routine med refill policy (all routine meds to be refilled for 3 mo or monthly (per pt preference) up to one year from last visit, then month to month grace period for 3 mo, then further med refills will have to be denied) ? ?

## 2021-10-10 ENCOUNTER — Encounter: Payer: Self-pay | Admitting: Family Medicine

## 2021-10-10 ENCOUNTER — Other Ambulatory Visit: Payer: Self-pay

## 2021-10-10 ENCOUNTER — Telehealth: Payer: Self-pay | Admitting: Internal Medicine

## 2021-10-10 ENCOUNTER — Ambulatory Visit (INDEPENDENT_AMBULATORY_CARE_PROVIDER_SITE_OTHER): Payer: Medicare Other | Admitting: Family Medicine

## 2021-10-10 VITALS — BP 122/66 | HR 101 | Temp 98.2°F | Ht 74.0 in | Wt 200.0 lb

## 2021-10-10 DIAGNOSIS — H6090 Unspecified otitis externa, unspecified ear: Secondary | ICD-10-CM | POA: Diagnosis not present

## 2021-10-10 DIAGNOSIS — H60509 Unspecified acute noninfective otitis externa, unspecified ear: Secondary | ICD-10-CM

## 2021-10-10 MED ORDER — NEOMYCIN-POLYMYXIN-HC 3.5-10000-1 OT SOLN: 4.0000 [drp] | Freq: Four times a day (QID) | OTIC | 0 refills | Status: DC

## 2021-10-10 MED ORDER — HYDROCORTISONE-ACETIC ACID 1-2 % OT SOLN: 4.0000 [drp] | Freq: Four times a day (QID) | OTIC | 0 refills | Status: DC

## 2021-10-10 MED ORDER — ACETIC ACID 2 % OT SOLN: 4.0000 [drp] | Freq: Four times a day (QID) | OTIC | 0 refills | Status: DC

## 2021-10-10 NOTE — Telephone Encounter (Signed)
Pt saw Dr Damita Dunnings today, and says his pharmacy is out of the Rx called in today.

## 2021-10-10 NOTE — Addendum Note (Signed)
Addended by: Tonia Ghent on: 10/10/2021 02:43 PM   Modules accepted: Orders

## 2021-10-10 NOTE — Patient Instructions (Signed)
Use the drop 4 times a day and update [place] as needed.  Take care.  Glad to see you.

## 2021-10-10 NOTE — Telephone Encounter (Signed)
Can try cortisporin otic.  Rx sent.  Thanks.

## 2021-10-10 NOTE — Progress Notes (Signed)
This visit occurred during the SARS-CoV-2 public health emergency.  Safety protocols were in place, including screening questions prior to the visit, additional usage of staff PPE, and extensive cleaning of exam room while observing appropriate contact time as indicated for disinfecting solutions.  Ear sx.  Right ear.  H/o wax impaction in the past.  He tried home irrigation kit, tried that about 1 week ago.  He has ENT eval pending but not until January.  He tried otc drop, now with stinging locally, dec in hearing, sore.  No L sided sx.  No FCNAVD.  No ST.  No ear drainage.    Meds, vitals, and allergies reviewed.   ROS: Per HPI unless specifically indicated in ROS section   Nad ncat L ear wax removed with curette after consent.  No complications.  Tolerated well.  Recheck wnl L TM and canal.  R canal and TM with purulent discharge in the canal.   B pinna wnl.  No pre/post auricular LA.   Hearing intact but muffled on R ear compared to L but Weber louder on the R ear, d/w pt.

## 2021-10-10 NOTE — Telephone Encounter (Signed)
Patient aware rx was sent.

## 2021-10-10 NOTE — Telephone Encounter (Signed)
Patient wants to see if an alternative can be sent in?

## 2021-10-11 DIAGNOSIS — H60509 Unspecified acute noninfective otitis externa, unspecified ear: Secondary | ICD-10-CM | POA: Insufficient documentation

## 2021-10-11 NOTE — Assessment & Plan Note (Signed)
Tympanic membrane visible, does not need a wick.  Reasonable to use VoSol HCT but this was not available so reasonable to use Cortisporin otic.  Routine use discussed with patient.  Okay for outpatient follow-up.  Cerumen impaction removed with curette from left canal without complication.

## 2021-10-12 ENCOUNTER — Ambulatory Visit: Payer: Medicare Other | Admitting: Internal Medicine

## 2021-10-12 ENCOUNTER — Ambulatory Visit: Payer: Medicare Other | Admitting: Nurse Practitioner

## 2021-10-27 DIAGNOSIS — L570 Actinic keratosis: Secondary | ICD-10-CM | POA: Insufficient documentation

## 2021-10-27 DIAGNOSIS — Z85828 Personal history of other malignant neoplasm of skin: Secondary | ICD-10-CM | POA: Insufficient documentation

## 2021-10-28 ENCOUNTER — Encounter: Payer: Self-pay | Admitting: Internal Medicine

## 2021-11-17 DIAGNOSIS — H903 Sensorineural hearing loss, bilateral: Secondary | ICD-10-CM | POA: Diagnosis not present

## 2021-11-17 DIAGNOSIS — H60331 Swimmer's ear, right ear: Secondary | ICD-10-CM | POA: Diagnosis not present

## 2021-11-17 DIAGNOSIS — H6123 Impacted cerumen, bilateral: Secondary | ICD-10-CM | POA: Diagnosis not present

## 2021-11-17 DIAGNOSIS — R42 Dizziness and giddiness: Secondary | ICD-10-CM | POA: Diagnosis not present

## 2021-11-22 DIAGNOSIS — R42 Dizziness and giddiness: Secondary | ICD-10-CM | POA: Diagnosis not present

## 2021-11-25 ENCOUNTER — Other Ambulatory Visit: Payer: Self-pay | Admitting: Internal Medicine

## 2021-11-25 NOTE — Telephone Encounter (Signed)
Please refill as per office routine med refill policy (all routine meds to be refilled for 3 mo or monthly (per pt preference) up to one year from last visit, then month to month grace period for 3 mo, then further med refills will have to be denied) ? ?

## 2021-11-30 DIAGNOSIS — R42 Dizziness and giddiness: Secondary | ICD-10-CM | POA: Diagnosis not present

## 2021-11-30 DIAGNOSIS — H903 Sensorineural hearing loss, bilateral: Secondary | ICD-10-CM | POA: Diagnosis not present

## 2021-12-10 ENCOUNTER — Encounter: Payer: Self-pay | Admitting: Internal Medicine

## 2021-12-10 ENCOUNTER — Other Ambulatory Visit: Payer: Self-pay | Admitting: Internal Medicine

## 2021-12-11 ENCOUNTER — Other Ambulatory Visit: Payer: Self-pay

## 2021-12-11 MED ORDER — ALBUTEROL SULFATE HFA 108 (90 BASE) MCG/ACT IN AERS: 2.0000 | INHALATION_SPRAY | Freq: Four times a day (QID) | RESPIRATORY_TRACT | 3 refills | Status: DC | PRN

## 2021-12-15 DIAGNOSIS — H60331 Swimmer's ear, right ear: Secondary | ICD-10-CM | POA: Diagnosis not present

## 2021-12-21 ENCOUNTER — Other Ambulatory Visit: Payer: Self-pay | Admitting: Internal Medicine

## 2021-12-21 ENCOUNTER — Telehealth: Payer: Self-pay | Admitting: Internal Medicine

## 2021-12-21 MED ORDER — ALBUTEROL SULFATE HFA 108 (90 BASE) MCG/ACT IN AERS: 2.0000 | INHALATION_SPRAY | Freq: Four times a day (QID) | RESPIRATORY_TRACT | 5 refills | Status: DC | PRN

## 2021-12-21 NOTE — Telephone Encounter (Signed)
Very sorry pt has what looks like medicare as primary insurance, which is a "sick person" insurance   This does not pay for labs prior to visit, so we are not able to do this

## 2021-12-21 NOTE — Telephone Encounter (Signed)
Pt requesting order for labs prior to his annual visit on 02-26-2022

## 2021-12-22 NOTE — Telephone Encounter (Signed)
Called patient regarding lab appt not being done before appt due to insurance.  Patient verbalized understanding

## 2021-12-24 DIAGNOSIS — R197 Diarrhea, unspecified: Secondary | ICD-10-CM | POA: Diagnosis not present

## 2021-12-24 DIAGNOSIS — K5289 Other specified noninfective gastroenteritis and colitis: Secondary | ICD-10-CM | POA: Diagnosis not present

## 2021-12-24 DIAGNOSIS — R5383 Other fatigue: Secondary | ICD-10-CM | POA: Diagnosis not present

## 2021-12-25 ENCOUNTER — Encounter: Payer: Self-pay | Admitting: Internal Medicine

## 2021-12-27 ENCOUNTER — Ambulatory Visit (INDEPENDENT_AMBULATORY_CARE_PROVIDER_SITE_OTHER): Payer: Medicare Other | Admitting: Internal Medicine

## 2021-12-27 ENCOUNTER — Encounter: Payer: Self-pay | Admitting: Internal Medicine

## 2021-12-27 ENCOUNTER — Other Ambulatory Visit: Payer: Self-pay

## 2021-12-27 ENCOUNTER — Other Ambulatory Visit: Payer: Self-pay | Admitting: Internal Medicine

## 2021-12-27 VITALS — BP 112/78 | HR 72 | Temp 97.8°F | Ht 74.0 in | Wt 187.0 lb

## 2021-12-27 DIAGNOSIS — I10 Essential (primary) hypertension: Secondary | ICD-10-CM | POA: Diagnosis not present

## 2021-12-27 DIAGNOSIS — R739 Hyperglycemia, unspecified: Secondary | ICD-10-CM

## 2021-12-27 DIAGNOSIS — R1084 Generalized abdominal pain: Secondary | ICD-10-CM | POA: Diagnosis not present

## 2021-12-27 DIAGNOSIS — N179 Acute kidney failure, unspecified: Secondary | ICD-10-CM

## 2021-12-27 LAB — BASIC METABOLIC PANEL
BUN: 20 mg/dL (ref 6–23)
CO2: 29 mEq/L (ref 19–32)
Calcium: 9.5 mg/dL (ref 8.4–10.5)
Chloride: 100 mEq/L (ref 96–112)
Creatinine, Ser: 1.78 mg/dL — ABNORMAL HIGH (ref 0.40–1.50)
GFR: 37.45 mL/min — ABNORMAL LOW (ref >60.00)
Glucose, Bld: 105 mg/dL — ABNORMAL HIGH (ref 70–99)
Potassium: 4 mEq/L (ref 3.5–5.1)
Sodium: 137 mEq/L (ref 135–145)

## 2021-12-27 LAB — URINALYSIS, ROUTINE W REFLEX MICROSCOPIC
Hgb urine dipstick: NEGATIVE
Leukocytes,Ua: NEGATIVE
Nitrite: NEGATIVE
Specific Gravity, Urine: >=1.03 — AB (ref 1.000–1.030)
Total Protein, Urine: 100 — AB
Urine Glucose: NEGATIVE
Urobilinogen, UA: 0.2 (ref 0.0–1.0)
pH: 6 (ref 5.0–8.0)

## 2021-12-27 LAB — HEPATIC FUNCTION PANEL
ALT: 28 U/L (ref 0–53)
AST: 29 U/L (ref 0–37)
Albumin: 4.1 g/dL (ref 3.5–5.2)
Alkaline Phosphatase: 52 U/L (ref 39–117)
Bilirubin, Direct: 0 mg/dL (ref 0.0–0.3)
Total Bilirubin: 0.4 mg/dL (ref 0.2–1.2)
Total Protein: 7.7 g/dL (ref 6.0–8.3)

## 2021-12-27 LAB — LIPASE: Lipase: 14 U/L (ref 11.0–59.0)

## 2021-12-27 LAB — CBC WITH DIFFERENTIAL/PLATELET
Basophils Absolute: 0.1 10*3/uL (ref 0.0–0.1)
Basophils Relative: 0.9 % (ref 0.0–3.0)
Eosinophils Absolute: 0.2 10*3/uL (ref 0.0–0.7)
Eosinophils Relative: 3.2 % (ref 0.0–5.0)
HCT: 34.4 % — ABNORMAL LOW (ref 39.0–52.0)
Hemoglobin: 10.8 g/dL — ABNORMAL LOW (ref 13.0–17.0)
Lymphocytes Relative: 23 % (ref 12.0–46.0)
Lymphs Abs: 1.6 10*3/uL (ref 0.7–4.0)
MCHC: 31.4 g/dL (ref 30.0–36.0)
MCV: 75.1 fl — ABNORMAL LOW (ref 78.0–100.0)
Monocytes Absolute: 0.9 10*3/uL (ref 0.1–1.0)
Monocytes Relative: 12.3 % — ABNORMAL HIGH (ref 3.0–12.0)
Neutro Abs: 4.3 10*3/uL (ref 1.4–7.7)
Neutrophils Relative %: 60.6 % (ref 43.0–77.0)
Platelets: 328 10*3/uL (ref 150.0–400.0)
RBC: 4.58 Mil/uL (ref 4.22–5.81)
RDW: 15 % (ref 11.5–15.5)
WBC: 7.1 10*3/uL (ref 4.0–10.5)

## 2021-12-27 MED ORDER — DIPHENOXYLATE-ATROPINE 2.5-0.025 MG PO TABS: 1.0000 | ORAL_TABLET | Freq: Four times a day (QID) | ORAL | 0 refills | Status: DC | PRN

## 2021-12-27 NOTE — Progress Notes (Signed)
Patient ID: Oscar Dixon, male   DOB: [birth year], 76 y.o.   MRN: [id number]        Chief Complaint: follow up 3 days onset GI symptoms       HPI:  Oscar Dixon is a 76 y.o. male here with c/o 6 days onset feeling warm, n/v/d with watery diarrhea, and crampy abd pains, feeling weak and fatigued overall; no chills or other sick contacts' Pt denies chest pain, increased sob or doe, wheezing, orthopnea, PND, increased LE swelling, palpitations, dizziness or syncope.   Pt denies polydipsia, polyuria, or new focal neuro s/s.  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   Arthritis    LEFT hand   CARPAL TUNNEL SYNDROME, BILATERAL 12/15/2009   Cataract    bilateral sx   DYSPNEA ON EXERTION 12/15/2009   Erectile dysfunction 04/12/2012   FATIGUE 03/27/2010   GERD 05/20/2007   on meds   GOUT 03/27/2010   HAND PAIN 12/15/2009   Headache(784.0) 16/96/7893   HERNIA, UMBILICAL 06/14/1750   HYPERLIPIDEMIA 07/09/2008   on meds   HYPERTENSION 05/20/2007   on meds   LIBIDO, DECREASED 03/27/2010   Mitral regurgitation 09/12/2011   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 He has never used smokeless tobacco. He reports current alcohol use of about 7.0 standard drinks per week. He reports that he does not use drugs. family history includes Alcohol abuse in his mother;  Hypertension in his father and mother; Lung cancer (age of onset: 75) in his brother; Stroke (age of onset: 20) in his father. Allergies  Allergen Reactions   Augmentin [Amoxicillin-Pot Clavulanate] Diarrhea    diarrhea   Current Outpatient Medications on File Prior to Visit  Medication Sig Dispense Refill   albuterol (PROAIR HFA) 108 (90 Base) MCG/ACT inhaler Inhale 2 puffs into the lungs every 6 (six) hours as needed for wheezing or shortness of breath. 8 g 5   amLODipine (NORVASC) 10 MG tablet TAKE 1 TABLET(10 MG) BY MOUTH DAILY 90 tablet 2   aspirin 81 MG EC tablet Take 81 mg by mouth daily.     benazepril (LOTENSIN) 40 MG tablet TAKE 1 TABLET(40 MG) BY MOUTH DAILY 90 tablet 3   Cholecalciferol (VITAMIN D3 PO) Take 2,000 Units by mouth daily.     esomeprazole (NEXIUM) 40 MG capsule TAKE 1 CAPSULE BY MOUTH EVERY DAY AT NOON 90 capsule 2   loperamide (IMODIUM) 2 MG capsule 4 mg by mouth initially, followed by 2 mg by mouth after each unformed stool; dosage should not exceed 8 mg/day for 2 days.     neomycin-polymyxin-hydrocortisone (CORTISPORIN) OTIC solution Place 4 drops into the right  ear 4 (four) times daily. 10 mL 0   rosuvastatin (CRESTOR) 20 MG tablet TAKE 1 TABLET(20 MG) BY MOUTH DAILY 90 tablet 3   zolpidem (AMBIEN) 5 MG tablet Take 1 tablet (5 mg total) by mouth at bedtime as needed. for sleep 90 tablet 1   No current facility-administered medications on file prior to visit.        ROS:  All others reviewed and negative.  Objective        PE:  BP 112/78 (BP Location: Left Arm, Patient Position: Sitting, Cuff Size: Large)    Pulse 72    Temp 97.8 F (36.6 C) (Oral)    Ht 6\' 2"  (1.88 m)    Wt 187 lb (84.8 kg)    SpO2 100%    BMI 24.01 kg/m                 Constitutional: Pt appears in NAD               HENT: Head: NCAT.                Right Ear: External ear normal.                 Left Ear: External ear normal.                Eyes: . Pupils are equal, round, and reactive  to light. Conjunctivae and EOM are normal               Nose: without d/c or deformity               Neck: Neck supple. Gross normal ROM               Cardiovascular: Normal rate and regular rhythm.                 Pulmonary/Chest: Effort normal and breath sounds without rales or wheezing.                Abd:  Soft, NT, ND, + BS, no organomegaly               Neurological: Pt is alert. At baseline orientation, motor grossly intact               Skin: Skin is warm. No rashes, no other new lesions, LE edema - none               Psychiatric: Pt behavior is normal without agitation   Micro: none  Cardiac tracings I have personally interpreted today:  none  Pertinent Radiological findings (summarize): none   Lab Results  Component Value Date   WBC 7.1 12/27/2021   HGB 10.8 (L) 12/27/2021   HCT 34.4 (L) 12/27/2021   PLT 328.0 12/27/2021   GLUCOSE 105 (H) 12/27/2021   CHOL 132 12/21/2020   TRIG 59.0 12/21/2020   HDL 63.50 12/21/2020   LDLCALC 56 12/21/2020   ALT 28 12/27/2021   AST 29 12/27/2021   NA 137 12/27/2021   K 4.0 12/27/2021   CL 100 12/27/2021   CREATININE 1.78 (H) 12/27/2021   BUN 20 12/27/2021   CO2 29 12/27/2021   TSH 1.17 12/21/2020   PSA 2.61 12/21/2020   HGBA1C 5.9 12/21/2020   Assessment/Plan:  Oscar Dixon is a 76 y.o. White or Caucasian [1] male with  has a past medical history of ADD (07/09/2008), Arthritis, CARPAL TUNNEL SYNDROME, BILATERAL (12/15/2009), Cataract, DYSPNEA ON EXERTION (12/15/2009), Erectile  dysfunction (04/12/2012), FATIGUE (03/27/2010), GERD (05/20/2007), GOUT (03/27/2010), HAND PAIN (12/15/2009), Headache(784.0) (37/08/6268), HERNIA, UMBILICAL (4/85/4627), HYPERLIPIDEMIA (07/09/2008), HYPERTENSION (05/20/2007), LIBIDO, DECREASED (03/27/2010), Mitral regurgitation (09/12/2011), and Other specified forms of hearing loss (06/30/2010).  Abdominal pain Exam is c/w acute gastroenteritis; for cbc with labs and consider further testing for leukocytosis or  dehydration  Essential hypertension BP Readings from Last 3 Encounters:  12/27/21 112/78  10/10/21 122/66  06/29/21 112/70   Stable, pt to continue medical treatment amlodipine, lotensin   Hyperglycemia Lab Results  Component Value Date   HGBA1C 5.9 12/21/2020   Stable, pt to continue current medical treatment  - diet  Followup: Return if symptoms worsen or fail to improve.  Cathlean Cower, MD 12/30/2021 10:32 PM [facility]

## 2021-12-27 NOTE — Patient Instructions (Signed)
You appear to have acute gastroenteritis, likely viral   Please take all new medication as prescribed - the lomotil for the diarrhea if needed  Please continue all other medications as before, and refills have been done if requested.  Please have the pharmacy call with any other refills you may need.  Please keep your appointments with your specialists as you may have planned  Please go to the LAB at the blood drawing area for the tests to be done  You will be contacted by phone if any changes need to be made immediately.  Otherwise, you will receive a letter about your results with an explanation, but please check with MyChart first.  Please remember to sign up for MyChart if you have not done so, as this will be important to you in the future with finding out test results, communicating by private email, and scheduling acute appointments online when needed.

## 2021-12-30 ENCOUNTER — Encounter: Payer: Self-pay | Admitting: Internal Medicine

## 2021-12-30 NOTE — Assessment & Plan Note (Signed)
Exam is c/w acute gastroenteritis; for cbc with labs and consider further testing for leukocytosis or dehydration

## 2021-12-30 NOTE — Assessment & Plan Note (Signed)
BP Readings from Last 3 Encounters:  12/27/21 112/78  10/10/21 122/66  06/29/21 112/70   Stable, pt to continue medical treatment amlodipine, lotensin

## 2021-12-30 NOTE — Assessment & Plan Note (Signed)
Lab Results  Component Value Date   HGBA1C 5.9 12/21/2020   Stable, pt to continue current medical treatment  - diet

## 2022-01-04 ENCOUNTER — Other Ambulatory Visit (INDEPENDENT_AMBULATORY_CARE_PROVIDER_SITE_OTHER): Payer: Medicare Other

## 2022-01-04 DIAGNOSIS — N179 Acute kidney failure, unspecified: Secondary | ICD-10-CM

## 2022-01-04 LAB — BASIC METABOLIC PANEL
BUN: 18 mg/dL (ref 6–23)
CO2: 25 mEq/L (ref 19–32)
Calcium: 9.4 mg/dL (ref 8.4–10.5)
Chloride: 102 mEq/L (ref 96–112)
Creatinine, Ser: 1.49 mg/dL (ref 0.40–1.50)
GFR: 46.35 mL/min — ABNORMAL LOW (ref >60.00)
Glucose, Bld: 80 mg/dL (ref 70–99)
Potassium: 4.1 mEq/L (ref 3.5–5.1)
Sodium: 137 mEq/L (ref 135–145)

## 2022-02-20 ENCOUNTER — Encounter: Payer: Self-pay | Admitting: Internal Medicine

## 2022-02-24 ENCOUNTER — Other Ambulatory Visit: Payer: Self-pay | Admitting: Internal Medicine

## 2022-02-24 NOTE — Telephone Encounter (Signed)
Please refill as per office routine med refill policy (all routine meds to be refilled for 3 mo or monthly (per pt preference) up to one year from last visit, then month to month grace period for 3 mo, then further med refills will have to be denied) ? ?

## 2022-02-26 ENCOUNTER — Ambulatory Visit (INDEPENDENT_AMBULATORY_CARE_PROVIDER_SITE_OTHER): Payer: Medicare Other

## 2022-02-26 ENCOUNTER — Other Ambulatory Visit: Payer: Self-pay | Admitting: Internal Medicine

## 2022-02-26 ENCOUNTER — Ambulatory Visit (INDEPENDENT_AMBULATORY_CARE_PROVIDER_SITE_OTHER): Payer: Medicare Other | Admitting: Internal Medicine

## 2022-02-26 ENCOUNTER — Encounter: Payer: Self-pay | Admitting: Internal Medicine

## 2022-02-26 VITALS — BP 120/68 | HR 63 | Temp 97.8°F | Ht 74.0 in | Wt 199.4 lb

## 2022-02-26 DIAGNOSIS — D509 Iron deficiency anemia, unspecified: Secondary | ICD-10-CM

## 2022-02-26 DIAGNOSIS — I10 Essential (primary) hypertension: Secondary | ICD-10-CM

## 2022-02-26 DIAGNOSIS — N1831 Chronic kidney disease, stage 3a: Secondary | ICD-10-CM

## 2022-02-26 DIAGNOSIS — R0602 Shortness of breath: Secondary | ICD-10-CM | POA: Diagnosis not present

## 2022-02-26 DIAGNOSIS — R739 Hyperglycemia, unspecified: Secondary | ICD-10-CM | POA: Diagnosis not present

## 2022-02-26 DIAGNOSIS — R0600 Dyspnea, unspecified: Secondary | ICD-10-CM

## 2022-02-26 DIAGNOSIS — E538 Deficiency of other specified B group vitamins: Secondary | ICD-10-CM | POA: Diagnosis not present

## 2022-02-26 DIAGNOSIS — N320 Bladder-neck obstruction: Secondary | ICD-10-CM | POA: Diagnosis not present

## 2022-02-26 DIAGNOSIS — D649 Anemia, unspecified: Secondary | ICD-10-CM

## 2022-02-26 DIAGNOSIS — E782 Mixed hyperlipidemia: Secondary | ICD-10-CM | POA: Diagnosis not present

## 2022-02-26 DIAGNOSIS — E559 Vitamin D deficiency, unspecified: Secondary | ICD-10-CM | POA: Diagnosis not present

## 2022-02-26 LAB — BASIC METABOLIC PANEL
BUN: 21 mg/dL (ref 6–23)
CO2: 25 mEq/L (ref 19–32)
Calcium: 9.2 mg/dL (ref 8.4–10.5)
Chloride: 105 mEq/L (ref 96–112)
Creatinine, Ser: 1.2 mg/dL (ref 0.40–1.50)
GFR: 60.04 mL/min (ref >60.00)
Glucose, Bld: 112 mg/dL — ABNORMAL HIGH (ref 70–99)
Potassium: 4.4 mEq/L (ref 3.5–5.1)
Sodium: 140 mEq/L (ref 135–145)

## 2022-02-26 LAB — CBC WITH DIFFERENTIAL/PLATELET
Basophils Absolute: 0.1 10*3/uL (ref 0.0–0.1)
Basophils Relative: 1.7 % (ref 0.0–3.0)
Eosinophils Absolute: 0.7 10*3/uL (ref 0.0–0.7)
Eosinophils Relative: 8.8 % — ABNORMAL HIGH (ref 0.0–5.0)
HCT: 29 % — ABNORMAL LOW (ref 39.0–52.0)
Hemoglobin: 9.2 g/dL — ABNORMAL LOW (ref 13.0–17.0)
Lymphocytes Relative: 22.6 % (ref 12.0–46.0)
Lymphs Abs: 1.8 10*3/uL (ref 0.7–4.0)
MCHC: 31.9 g/dL (ref 30.0–36.0)
MCV: 70.1 fl — ABNORMAL LOW (ref 78.0–100.0)
Monocytes Absolute: 0.5 10*3/uL (ref 0.1–1.0)
Monocytes Relative: 6.8 % (ref 3.0–12.0)
Neutro Abs: 4.8 10*3/uL (ref 1.4–7.7)
Neutrophils Relative %: 60.1 % (ref 43.0–77.0)
Platelets: 327 10*3/uL (ref 150.0–400.0)
RBC: 4.14 Mil/uL — ABNORMAL LOW (ref 4.22–5.81)
RDW: 16.5 % — ABNORMAL HIGH (ref 11.5–15.5)
WBC: 8 10*3/uL (ref 4.0–10.5)

## 2022-02-26 LAB — HEPATIC FUNCTION PANEL
ALT: 13 U/L (ref 0–53)
AST: 18 U/L (ref 0–37)
Albumin: 4.3 g/dL (ref 3.5–5.2)
Alkaline Phosphatase: 36 U/L — ABNORMAL LOW (ref 39–117)
Bilirubin, Direct: 0.1 mg/dL (ref 0.0–0.3)
Total Bilirubin: 0.5 mg/dL (ref 0.2–1.2)
Total Protein: 7.2 g/dL (ref 6.0–8.3)

## 2022-02-26 LAB — VITAMIN D 25 HYDROXY (VIT D DEFICIENCY, FRACTURES): VITD: 43.68 ng/mL (ref 30.00–100.00)

## 2022-02-26 LAB — URINALYSIS, ROUTINE W REFLEX MICROSCOPIC
Bilirubin Urine: NEGATIVE
Hgb urine dipstick: NEGATIVE
Ketones, ur: NEGATIVE
Leukocytes,Ua: NEGATIVE
Nitrite: NEGATIVE
RBC / HPF: NONE SEEN (ref 0–?)
Specific Gravity, Urine: 1.025 (ref 1.000–1.030)
Total Protein, Urine: NEGATIVE
Urine Glucose: NEGATIVE
Urobilinogen, UA: 0.2 (ref 0.0–1.0)
pH: 5.5 (ref 5.0–8.0)

## 2022-02-26 LAB — BRAIN NATRIURETIC PEPTIDE: Pro B Natriuretic peptide (BNP): 54 pg/mL (ref 0.0–100.0)

## 2022-02-26 LAB — FERRITIN: Ferritin: 3.7 ng/mL — ABNORMAL LOW (ref 22.0–322.0)

## 2022-02-26 LAB — PSA: PSA: 3.7 ng/mL (ref 0.10–4.00)

## 2022-02-26 LAB — IBC PANEL
Iron: 13 ug/dL — ABNORMAL LOW (ref 42–165)
Saturation Ratios: 2.7 % — ABNORMAL LOW (ref 20.0–50.0)
TIBC: 485.8 ug/dL — ABNORMAL HIGH (ref 250.0–450.0)
Transferrin: 347 mg/dL (ref 212.0–360.0)

## 2022-02-26 LAB — LIPID PANEL
Cholesterol: 126 mg/dL (ref 0–200)
HDL: 63.5 mg/dL (ref >39.00)
LDL Cholesterol: 51 mg/dL (ref 0–99)
NonHDL: 62.16
Total CHOL/HDL Ratio: 2
Triglycerides: 57 mg/dL (ref 0.0–149.0)
VLDL: 11.4 mg/dL (ref 0.0–40.0)

## 2022-02-26 LAB — TSH: TSH: 0.87 u[IU]/mL (ref 0.35–5.50)

## 2022-02-26 LAB — HEMOGLOBIN A1C: Hgb A1c MFr Bld: 6.5 % (ref 4.6–6.5)

## 2022-02-26 LAB — VITAMIN B12: Vitamin B-12: 235 pg/mL (ref 211–911)

## 2022-02-26 MED ORDER — POLYSACCHARIDE IRON COMPLEX 150 MG PO CAPS: 150.0000 mg | ORAL_CAPSULE | Freq: Every day | ORAL | 1 refills | Status: DC

## 2022-02-26 NOTE — Patient Instructions (Signed)
Your EKG was done today ? ?Hopefully your lower stamina and dyspnea are simply due to the recent COVID and is temporary ? ?Please continue all other medications as before, and refills have been done if requested. ? ?Please have the pharmacy call with any other refills you may need. ? ?Please continue your efforts at being more active, low cholesterol diet, and weight control. ? ?You are otherwise up to date with prevention measures today. ? ?Please keep your appointments with your specialists as you may have planned ? ?Please go to the XRAY Department in the first floor for the x-ray testing ? ?Please go to the LAB at the blood drawing area for the tests to be done ? ?You will be contacted by phone if any changes need to be made immediately.  Otherwise, you will receive a letter about your results with an explanation, but please check with MyChart first. ? ?Please remember to sign up for MyChart if you have not done so, as this will be important to you in the future with finding out test results, communicating by private email, and scheduling acute appointments online when needed. ? ?Please make an Appointment to return in 6 months, or sooner if needed ?

## 2022-02-26 NOTE — Progress Notes (Signed)
Patient ID: Oscar Dixon, male   DOB: August 05, 1946, 76 y.o.   MRN: [id number] ? ? ? ?    Chief Complaint: follow up HTN, HLD and hyperglycemia , anemia new onset ? ?     HPI:  Oscar Dixon is a 76 y.o. male here overall doing ok, had second episode covid infection feb 2023 with perisstent fatigue and low stamina since then; with mild sob, albuterol not really helping.  No overt bleeding, did have feb 2023 mild anemia noted on labs.  Pt denies chest pain,wheezing, orthopnea, PND, increased LE swelling, palpitations, dizziness or syncope.   Pt denies polydipsia, polyuria, or new focal neuro s/s.    Pt denies fever, wt loss, night sweats, loss of appetite, or other constitutional symptoms   ?      ?Wt Readings from Last 3 Encounters:  ?02/26/22 199 lb 6.4 oz (90.4 kg)  ?12/27/21 187 lb (84.8 kg)  ?10/10/21 200 lb (90.7 kg)  ? ?BP Readings from Last 3 Encounters:  ?02/26/22 120/68  ?12/27/21 112/78  ?10/10/21 122/66  ? ?      ?Past Medical History:  ?Diagnosis Date  ? 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  ? Arthritis   ? LEFT hand  ? CARPAL TUNNEL SYNDROME, BILATERAL 12/15/2009  ? Cataract   ? bilateral sx  ? DYSPNEA ON EXERTION 12/15/2009  ? Erectile dysfunction 04/12/2012  ? FATIGUE 03/27/2010  ? GERD 05/20/2007  ? on meds  ? GOUT 03/27/2010  ? HAND PAIN 12/15/2009  ? Headache(784.0) 08/25/2009  ? HERNIA, UMBILICAL 0/26/3785  ? HYPERLIPIDEMIA 07/09/2008  ? on meds  ? HYPERTENSION 05/20/2007  ? on meds  ? LIBIDO, DECREASED 03/27/2010  ? Mitral regurgitation 09/12/2011  ? 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 ?Past Surgical History:  ?Procedure Laterality Date  ? ANKLE SURGERY Left 1967  ? CATARACT EXTRACTION Bilateral   ? COLONOSCOPY  2016  ? TA's -suprep (exc)  ? ELBOW SURGERY Right 1970  ? POLYPECTOMY  2016  ? TA's   ? s/p basal cell cancer    ? UMBILICAL HERNIA REPAIR    ? ? reports that he has quit smoking. He has never used smokeless tobacco. He reports current alcohol use of about 7.0 standard drinks per week. He reports that he  does not use drugs. ?family history includes Alcohol abuse in his mother; Hypertension in his father and mother; Lung cancer (age of onset: 29) in his brother; Stroke (age of onset: 63) in his father. ?Allergies  ?Allergen Reactions  ? Augmentin [Amoxicillin-Pot Clavulanate] Diarrhea  ?  diarrhea  ? ?Current Outpatient Medications on File Prior to Visit  ?Medication Sig Dispense Refill  ? albuterol (PROAIR HFA) 108 (90 Base) MCG/ACT inhaler Inhale 2 puffs into the lungs every 6 (six) hours as needed for wheezing or shortness of breath. 8 g 5  ? amLODipine (NORVASC) 10 MG tablet TAKE 1 TABLET(10 MG) BY MOUTH DAILY 90 tablet 2  ? aspirin 81 MG EC tablet Take 81 mg by mouth daily.    ? benazepril (LOTENSIN) 40 MG tablet TAKE 1 TABLET(40 MG) BY MOUTH DAILY 90 tablet 3  ? Cholecalciferol (VITAMIN D3 PO) Take 2,000 Units by mouth daily.    ? rosuvastatin (CRESTOR) 20 MG tablet TAKE 1 TABLET(20 MG) BY MOUTH DAILY 90 tablet 3  ? zolpidem (AMBIEN) 5 MG tablet Take 1 tablet (5 mg total) by mouth at bedtime as needed. for sleep 90 tablet 1  ? esomeprazole (NEXIUM) 40 MG capsule TAKE  1 CAPSULE BY MOUTH EVERY DAY AT NOON 90 capsule 2  ? ?No current facility-administered medications on file prior to visit.  ? ?     ROS:  All others reviewed and negative. ? ?Objective  ? ?     PE:  BP 120/68 (BP Location: Right Arm, Patient Position: Sitting, Cuff Size: Large)   Pulse 63   Temp 97.8 ?F (36.6 ?C) (Oral)   Ht '6\' 2"'$  (1.88 m)   Wt 199 lb 6.4 oz (90.4 kg)   SpO2 99%   BMI 25.60 kg/m?  ? ?              Constitutional: Pt appears in NAD ?              HENT: Head: NCAT.  ?              Right Ear: External ear normal.   ?              Left Ear: External ear normal.  ?              Eyes: . Pupils are equal, round, and reactive to light. Conjunctivae and EOM are normal ?              Nose: without d/c or deformity ?              Neck: Neck supple. Gross normal ROM ?              Cardiovascular: Normal rate and regular rhythm.   ?               Pulmonary/Chest: Effort normal and breath sounds without rales or wheezing.  ?              Abd:  Soft, NT, ND, + BS, no organomegaly ?              Neurological: Pt is alert. At baseline orientation, motor grossly intact ?              Skin: Skin is warm. No rashes, no other new lesions, LE edema - none ?              Psychiatric: Pt behavior is normal without agitation  ? ?Micro: none ? ?Cardiac tracings I have personally interpreted today:  NSR 73 ? ?Pertinent Radiological findings (summarize): none  ? ?Lab Results  ?Component Value Date  ? WBC 8.0 02/26/2022  ? HGB 9.2 (L) 02/26/2022  ? HCT 29.0 (L) 02/26/2022  ? PLT 327.0 02/26/2022  ? GLUCOSE 112 (H) 02/26/2022  ? CHOL 126 02/26/2022  ? TRIG 57.0 02/26/2022  ? HDL 63.50 02/26/2022  ? LDLCALC 51 02/26/2022  ? ALT 13 02/26/2022  ? AST 18 02/26/2022  ? NA 140 02/26/2022  ? K 4.4 02/26/2022  ? CL 105 02/26/2022  ? CREATININE 1.20 02/26/2022  ? BUN 21 02/26/2022  ? CO2 25 02/26/2022  ? TSH 0.87 02/26/2022  ? PSA 3.70 02/26/2022  ? HGBA1C 6.5 02/26/2022  ? ?Assessment/Plan:  ?Oscar Dixon is a 76 y.o. White or Caucasian [1] male with  has a past medical history of ADD (07/09/2008), Arthritis, CARPAL TUNNEL SYNDROME, BILATERAL (12/15/2009), Cataract, DYSPNEA ON EXERTION (12/15/2009), Erectile dysfunction (04/12/2012), FATIGUE (03/27/2010), GERD (05/20/2007), GOUT (03/27/2010), HAND PAIN (12/15/2009), Headache(784.0) (19/14/7829), HERNIA, UMBILICAL (5/62/1308), HYPERLIPIDEMIA (07/09/2008), HYPERTENSION (05/20/2007), LIBIDO, DECREASED (03/27/2010), Mitral regurgitation (09/12/2011), and Other specified forms of hearing loss (06/30/2010). ? ?Vitamin D deficiency ?Last vitamin D ?Lab  Results  ?Component Value Date  ? VD25OH 43.68 02/26/2022  ? ?Stable, cont oral replacement ? ? ?Hyperglycemia ?Lab Results  ?Component Value Date  ? HGBA1C 6.5 02/26/2022  ? ?Stable, pt to continue current medical treatment  - diet ? ? ?Hyperlipidemia ?Lab Results  ?Component Value Date  ?  LDLCALC 51 02/26/2022  ? ?Stable, pt to continue current statin crestor 20 ? ? ?Essential hypertension ?BP Readings from Last 3 Encounters:  ?02/26/22 120/68  ?12/27/21 112/78  ?10/10/21 122/66  ? ?Stable, pt to continue medical treatment norvasc, lotensin ? ? ?Dyspnea ?Etiology unclear, ecg reviewed, for labs as ordered including d dimer, for cxr and hopefully all transitory due to recent covid infection,  to f/u any worsening symptoms or concerns ? ?CKD (chronic kidney disease) stage 3, GFR 30-59 ml/min (HCC) ?Lab Results  ?Component Value Date  ? CREATININE 1.20 02/26/2022  ? ?Stable overall, cont to avoid nephrotoxins ? ?Followup: Return in about 6 months (around 08/28/2022). ? ?Cathlean Cower, MD 03/01/2022 8:20 PM ?[place] [facility] ?Internal Medicine ?

## 2022-02-27 ENCOUNTER — Ambulatory Visit
Admission: RE | Admit: 2022-02-27 | Discharge: 2022-02-27 | Disposition: A | Discharge status: Home / Self Care | Payer: Medicare Other | Source: Ambulatory Visit | Attending: Internal Medicine | Admitting: Internal Medicine

## 2022-02-27 ENCOUNTER — Other Ambulatory Visit: Payer: Self-pay | Admitting: Internal Medicine

## 2022-02-27 ENCOUNTER — Encounter: Payer: Self-pay | Admitting: Internal Medicine

## 2022-02-27 DIAGNOSIS — R079 Chest pain, unspecified: Secondary | ICD-10-CM

## 2022-02-27 DIAGNOSIS — J432 Centrilobular emphysema: Secondary | ICD-10-CM | POA: Diagnosis not present

## 2022-02-27 DIAGNOSIS — R42 Dizziness and giddiness: Secondary | ICD-10-CM | POA: Diagnosis not present

## 2022-02-27 DIAGNOSIS — D509 Iron deficiency anemia, unspecified: Secondary | ICD-10-CM

## 2022-02-27 DIAGNOSIS — R0600 Dyspnea, unspecified: Secondary | ICD-10-CM | POA: Diagnosis not present

## 2022-02-27 DIAGNOSIS — I2510 Atherosclerotic heart disease of native coronary artery without angina pectoris: Secondary | ICD-10-CM | POA: Diagnosis not present

## 2022-02-27 LAB — D-DIMER, QUANTITATIVE: D-Dimer, Quant: 1.14 mcg/mL FEU — ABNORMAL HIGH (ref <0.50)

## 2022-02-27 LAB — RETICULOCYTES
ABS Retic: 50640 cells/uL (ref 25000–90000)
Retic Ct Pct: 1.2 %

## 2022-02-27 MED ORDER — IOPAMIDOL (ISOVUE-370) INJECTION 76%
75.0000 mL | Freq: Once | INTRAVENOUS | Status: AC | PRN
  Administered 2022-02-27: 75 mL via INTRAVENOUS

## 2022-02-28 ENCOUNTER — Other Ambulatory Visit: Payer: Self-pay | Admitting: Internal Medicine

## 2022-02-28 DIAGNOSIS — E041 Nontoxic single thyroid nodule: Secondary | ICD-10-CM

## 2022-03-01 ENCOUNTER — Encounter: Payer: Self-pay | Admitting: Internal Medicine

## 2022-03-01 DIAGNOSIS — R0600 Dyspnea, unspecified: Secondary | ICD-10-CM | POA: Insufficient documentation

## 2022-03-01 DIAGNOSIS — N183 Chronic kidney disease, stage 3 unspecified: Secondary | ICD-10-CM | POA: Insufficient documentation

## 2022-03-01 NOTE — Assessment & Plan Note (Signed)
Lab Results  ?Component Value Date  ? CREATININE 1.20 02/26/2022  ? ?Stable overall, cont to avoid nephrotoxins ? ?

## 2022-03-01 NOTE — Assessment & Plan Note (Signed)
BP Readings from Last 3 Encounters:  ?02/26/22 120/68  ?12/27/21 112/78  ?10/10/21 122/66  ? ?Stable, pt to continue medical treatment norvasc, lotensin ? ?

## 2022-03-01 NOTE — Assessment & Plan Note (Signed)
Lab Results  ?Component Value Date  ? HGBA1C 6.5 02/26/2022  ? ?Stable, pt to continue current medical treatment  - diet ? ?

## 2022-03-01 NOTE — Assessment & Plan Note (Signed)
Last vitamin D ?Lab Results  ?Component Value Date  ? VD25OH 43.68 02/26/2022  ? ?Stable, cont oral replacement ? ?

## 2022-03-01 NOTE — Assessment & Plan Note (Addendum)
Etiology unclear, ecg reviewed, for labs as ordered including d dimer, for cxr and hopefully all transitory due to recent covid infection,  to f/u any worsening symptoms or concerns ?

## 2022-03-01 NOTE — Assessment & Plan Note (Signed)
Lab Results  ?Component Value Date  ? LDLCALC 51 02/26/2022  ? ?Stable, pt to continue current statin crestor 20 ? ?

## 2022-03-02 ENCOUNTER — Other Ambulatory Visit: Payer: Medicare Other

## 2022-03-05 ENCOUNTER — Other Ambulatory Visit (INDEPENDENT_AMBULATORY_CARE_PROVIDER_SITE_OTHER): Payer: Medicare Other

## 2022-03-05 ENCOUNTER — Ambulatory Visit
Admission: RE | Admit: 2022-03-05 | Discharge: 2022-03-05 | Disposition: A | Discharge status: Home / Self Care | Payer: Medicare Other | Source: Ambulatory Visit | Attending: Internal Medicine | Admitting: Internal Medicine

## 2022-03-05 DIAGNOSIS — E041 Nontoxic single thyroid nodule: Secondary | ICD-10-CM | POA: Diagnosis not present

## 2022-03-05 DIAGNOSIS — D509 Iron deficiency anemia, unspecified: Secondary | ICD-10-CM | POA: Diagnosis not present

## 2022-03-05 LAB — CBC WITH DIFFERENTIAL/PLATELET
Basophils Absolute: 0.1 10*3/uL (ref 0.0–0.1)
Basophils Relative: 2 % (ref 0.0–3.0)
Eosinophils Absolute: 0.5 10*3/uL (ref 0.0–0.7)
Eosinophils Relative: 8 % — ABNORMAL HIGH (ref 0.0–5.0)
HCT: 29.2 % — ABNORMAL LOW (ref 39.0–52.0)
Hemoglobin: 9.1 g/dL — ABNORMAL LOW (ref 13.0–17.0)
Lymphocytes Relative: 26.1 % (ref 12.0–46.0)
Lymphs Abs: 1.6 10*3/uL (ref 0.7–4.0)
MCHC: 31.2 g/dL (ref 30.0–36.0)
MCV: 69.4 fl — ABNORMAL LOW (ref 78.0–100.0)
Monocytes Absolute: 0.4 10*3/uL (ref 0.1–1.0)
Monocytes Relative: 7 % (ref 3.0–12.0)
Neutro Abs: 3.6 10*3/uL (ref 1.4–7.7)
Neutrophils Relative %: 56.9 % (ref 43.0–77.0)
Platelets: 326 10*3/uL (ref 150.0–400.0)
RBC: 4.21 Mil/uL — ABNORMAL LOW (ref 4.22–5.81)
RDW: 16.6 % — ABNORMAL HIGH (ref 11.5–15.5)
WBC: 6.3 10*3/uL (ref 4.0–10.5)

## 2022-03-06 ENCOUNTER — Other Ambulatory Visit: Payer: Self-pay | Admitting: Internal Medicine

## 2022-03-06 DIAGNOSIS — E041 Nontoxic single thyroid nodule: Secondary | ICD-10-CM

## 2022-03-09 ENCOUNTER — Encounter: Payer: Self-pay | Admitting: Gastroenterology

## 2022-03-12 ENCOUNTER — Emergency Department (HOSPITAL_COMMUNITY)
Admission: EM | Admit: 2022-03-12 | Discharge: 2022-03-12 | Disposition: A | Discharge status: Home / Self Care | Payer: Medicare Other | Attending: Emergency Medicine | Admitting: Emergency Medicine

## 2022-03-12 ENCOUNTER — Other Ambulatory Visit (INDEPENDENT_AMBULATORY_CARE_PROVIDER_SITE_OTHER): Payer: Medicare Other

## 2022-03-12 ENCOUNTER — Encounter (HOSPITAL_COMMUNITY): Payer: Self-pay | Admitting: Emergency Medicine

## 2022-03-12 ENCOUNTER — Encounter: Payer: Self-pay | Admitting: Internal Medicine

## 2022-03-12 DIAGNOSIS — Z7982 Long term (current) use of aspirin: Secondary | ICD-10-CM | POA: Insufficient documentation

## 2022-03-12 DIAGNOSIS — R531 Weakness: Secondary | ICD-10-CM | POA: Diagnosis not present

## 2022-03-12 DIAGNOSIS — Z79899 Other long term (current) drug therapy: Secondary | ICD-10-CM | POA: Insufficient documentation

## 2022-03-12 DIAGNOSIS — D509 Iron deficiency anemia, unspecified: Secondary | ICD-10-CM

## 2022-03-12 DIAGNOSIS — R0609 Other forms of dyspnea: Secondary | ICD-10-CM | POA: Diagnosis not present

## 2022-03-12 DIAGNOSIS — I10 Essential (primary) hypertension: Secondary | ICD-10-CM | POA: Insufficient documentation

## 2022-03-12 DIAGNOSIS — R42 Dizziness and giddiness: Secondary | ICD-10-CM | POA: Diagnosis present

## 2022-03-12 DIAGNOSIS — R600 Localized edema: Secondary | ICD-10-CM | POA: Insufficient documentation

## 2022-03-12 LAB — CBC WITH DIFFERENTIAL/PLATELET
Abs Immature Granulocytes: 0.02 10*3/uL (ref 0.00–0.07)
Basophils Absolute: 0.1 10*3/uL (ref 0.0–0.1)
Basophils Absolute: 0.1 10*3/uL (ref 0.0–0.1)
Basophils Relative: 2.4 % (ref 0.0–3.0)
Basophils Relative: 2 %
Eosinophils Absolute: 0.4 10*3/uL (ref 0.0–0.7)
Eosinophils Absolute: 0.3 10*3/uL (ref 0.0–0.5)
Eosinophils Relative: 7.6 % — ABNORMAL HIGH (ref 0.0–5.0)
Eosinophils Relative: 5 %
HCT: 26.8 % — ABNORMAL LOW (ref 39.0–52.0)
HCT: 29.4 % — ABNORMAL LOW (ref 39.0–52.0)
Hemoglobin: 8.5 g/dL — ABNORMAL LOW (ref 13.0–17.0)
Hemoglobin: 8.9 g/dL — ABNORMAL LOW (ref 13.0–17.0)
Immature Granulocytes: 0 %
Lymphocytes Relative: 24.4 % (ref 12.0–46.0)
Lymphocytes Relative: 26 %
Lymphs Abs: 1.2 10*3/uL (ref 0.7–4.0)
Lymphs Abs: 1.8 10*3/uL (ref 0.7–4.0)
MCH: 21.6 pg — ABNORMAL LOW (ref 26.0–34.0)
MCHC: 31.6 g/dL (ref 30.0–36.0)
MCHC: 30.3 g/dL (ref 30.0–36.0)
MCV: 68.6 fl — ABNORMAL LOW (ref 78.0–100.0)
MCV: 71.4 fL — ABNORMAL LOW (ref 80.0–100.0)
Monocytes Absolute: 0.4 10*3/uL (ref 0.1–1.0)
Monocytes Absolute: 0.4 10*3/uL (ref 0.1–1.0)
Monocytes Relative: 7.3 % (ref 3.0–12.0)
Monocytes Relative: 6 %
Neutro Abs: 3 10*3/uL (ref 1.4–7.7)
Neutro Abs: 4.2 10*3/uL (ref 1.7–7.7)
Neutrophils Relative %: 58.3 % (ref 43.0–77.0)
Neutrophils Relative %: 61 %
Platelets: 330 10*3/uL (ref 150.0–400.0)
Platelets: 345 10*3/uL (ref 150–400)
RBC: 3.9 Mil/uL — ABNORMAL LOW (ref 4.22–5.81)
RBC: 4.12 MIL/uL — ABNORMAL LOW (ref 4.22–5.81)
RDW: 16.4 % — ABNORMAL HIGH (ref 11.5–15.5)
RDW: 15.9 % — ABNORMAL HIGH (ref 11.5–15.5)
WBC: 5.1 10*3/uL (ref 4.0–10.5)
WBC: 6.8 10*3/uL (ref 4.0–10.5)
nRBC: 0 % (ref 0.0–0.2)

## 2022-03-12 LAB — COMPREHENSIVE METABOLIC PANEL
ALT: 18 U/L (ref 0–44)
AST: 24 U/L (ref 15–41)
Albumin: 4.2 g/dL (ref 3.5–5.0)
Alkaline Phosphatase: 33 U/L — ABNORMAL LOW (ref 38–126)
Anion gap: 9 (ref 5–15)
BUN: 17 mg/dL (ref 8–23)
CO2: 25 mmol/L (ref 22–32)
Calcium: 9.3 mg/dL (ref 8.9–10.3)
Chloride: 106 mmol/L (ref 98–111)
Creatinine, Ser: 1.35 mg/dL — ABNORMAL HIGH (ref 0.61–1.24)
GFR, Estimated: 55 mL/min — ABNORMAL LOW (ref >60)
Glucose, Bld: 107 mg/dL — ABNORMAL HIGH (ref 70–99)
Potassium: 4.3 mmol/L (ref 3.5–5.1)
Sodium: 140 mmol/L (ref 135–145)
Total Bilirubin: 0.9 mg/dL (ref 0.3–1.2)
Total Protein: 7.4 g/dL (ref 6.5–8.1)

## 2022-03-12 LAB — TYPE AND SCREEN
ABO/RH(D): A NEG
Antibody Screen: NEGATIVE

## 2022-03-12 LAB — POC OCCULT BLOOD, ED: Fecal Occult Bld: NEGATIVE

## 2022-03-12 NOTE — ED Provider Notes (Signed)
Supervised resident visit.  Patient here with generalized weakness.  Recently started on iron due to low iron counts.  Blood count was 8.5 at primary care doctor's office and was concern for GI bleed as having some dark stools.  Supposed to have GI work-up soon.  Patient has normal vitals.  No fever.  Hemoccult is negative.  He has grossly brown stool on exam.  Repeat hemoglobin today is 8.9.  Upon chart review hemoglobin has been around 8.9-9.5 over the last month.  He does have a slight drop from his baseline of 11 from 4 months ago.  But he has had extensive work-up and overall appears that this is probably iron deficiency anemia.  I do not have concern for an acute GI bleed at this time.  Remaining lab work is overall unremarkable.  Question if he has some very mild CKD which could also be playing a role.  Overall he is comfortable.  Discharged in good condition.  Understands return precautions. ? ?This chart was dictated using voice recognition software.  Despite best efforts to proofread,  errors can occur which can change the documentation meaning.  ?  Lennice Sites, DO ?03/12/22 1708 ? ?

## 2022-03-12 NOTE — ED Provider Notes (Signed)
?[place] ?Provider Note ? ? ?CSN: [id number] ?Arrival date & time: 03/12/22  1411 ? ?  ? ?History ? ?Chief Complaint  ?Patient presents with  ? Weakness  ? ? ?Oscar Dixon is a 76 y.o. male  ? ? ?Weakness ?Associated symptoms: dizziness and shortness of breath (on exertion)   ?Associated symptoms: no chest pain, no cough, no diarrhea, no dysuria, no fever, no headaches, no nausea, no seizures and no vomiting   ?Patient presents to the ED with several months of fatigue, dizziness, dyspnea on exertion.  Patient states that the symptoms have been persistent over the past several months with no improvement.  He has been seeing his primary care doctor and was told that he had low iron levels and was started on iron supplementation about 2 weeks ago.  He states that he has had several months of dark/black stools but no bright red blood per rectum.  He denies any associated chest pain.  The shortness of breath only occurs with exertion.  He has also had months of persistent dizziness that does appear to worsen with certain head motions and with changes in position.  No speech difficulty or focal weakness.  Denies any recent fevers, dysuria, abdominal pain, nausea/vomiting/diarrhea.  Patient had labs drawn this morning which showed a hemoglobin of 8.5 and he states that his PCP recommended he come to the ED for further evaluation given his continued downtrending hemoglobin.  Patient does note that he has an appoint with GI in 1 month. ?  ? ?Home Medications ?Prior to Admission medications   ?Medication Sig Start Date End Date Taking? Authorizing Provider  ?iron polysaccharides (NU-IRON) 150 MG capsule Take 1 capsule (150 mg total) by mouth daily. 02/26/22   Biagio Borg, MD  ?albuterol [facility] HFA) [phone] Base) MCG/ACT inhaler Inhale 2 puffs into the lungs every 6 (six) hours as needed for wheezing or shortness of breath. 12/21/21   Biagio Borg, MD  ?amLODipine (NORVASC) 10 MG  tablet TAKE 1 TABLET(10 MG) BY MOUTH DAILY 09/12/21   Biagio Borg, MD  ?aspirin 81 MG EC tablet Take 81 mg by mouth daily.    [provider]  ?benazepril (LOTENSIN) 40 MG tablet TAKE 1 TABLET(40 MG) BY MOUTH DAILY 11/27/21   Biagio Borg, MD  ?Cholecalciferol (VITAMIN D3 PO) Take 2,000 Units by mouth daily.    [provider]  ?esomeprazole (NEXIUM) 40 MG capsule TAKE 1 CAPSULE BY MOUTH EVERY DAY AT NOON 02/26/22   Biagio Borg, MD  ?rosuvastatin (CRESTOR) 20 MG tablet TAKE 1 TABLET(20 MG) BY MOUTH DAILY 10/06/21   Biagio Borg, MD  ?zolpidem (AMBIEN) 5 MG tablet Take 1 tablet (5 mg total) by mouth at bedtime as needed. for sleep 10/04/21   Biagio Borg, MD  ?   ? ?Allergies    ?Augmentin [amoxicillin-pot clavulanate]   ? ?Review of Systems   ?Review of Systems  ?Constitutional:  Positive for fatigue. Negative for fever.  ?Respiratory:  Positive for shortness of breath (on exertion). Negative for cough.   ?Cardiovascular:  Positive for leg swelling. Negative for chest pain and palpitations.  ?Gastrointestinal:  Negative for blood in stool, diarrhea, nausea and vomiting.  ?     Positive for dark stools.  ?Genitourinary:  Negative for dysuria.  ?Skin:  Negative for rash.  ?Neurological:  Positive for dizziness and weakness (generalized). Negative for seizures, syncope, light-headedness and headaches.  ? ?Physical Exam ?Updated  Vital Signs ?BP 133/78   Pulse 77   Temp 98.4 ?F (36.9 ?C) (Oral)   Resp 17   Ht '6\' 2"'$  (1.88 m)   Wt 90.7 kg   SpO2 99%   BMI 25.68 kg/m?  ?Physical Exam ?Vitals and nursing note reviewed.  ?Constitutional:   ?   General: He is not in acute distress. ?   Appearance: Normal appearance. He is well-developed and normal weight. He is not toxic-appearing or diaphoretic.  ?   Comments: Elderly but overall well-appearing.  ?HENT:  ?   Head: Normocephalic and atraumatic.  ?   Nose: Nose normal.  ?   Mouth/Throat:  ?   Mouth: Mucous membranes are moist.  ?   Pharynx:  Oropharynx is clear. No oropharyngeal exudate or posterior oropharyngeal erythema.  ?Eyes:  ?   Extraocular Movements: Extraocular movements intact.  ?   Pupils: Pupils are equal, round, and reactive to light.  ?Cardiovascular:  ?   Rate and Rhythm: Normal rate and regular rhythm.  ?   Heart sounds: Normal heart sounds. No murmur heard. ?  No friction rub. No gallop.  ?Pulmonary:  ?   Effort: Pulmonary effort is normal. No respiratory distress.  ?   Breath sounds: Normal breath sounds. No stridor. No wheezing, rhonchi or rales.  ?Abdominal:  ?   General: There is no distension.  ?   Palpations: Abdomen is soft.  ?   Tenderness: There is no abdominal tenderness. There is no guarding or rebound.  ?Musculoskeletal:     ?   General: No swelling.  ?   Cervical back: Neck supple. No rigidity.  ?   Right lower leg: Edema (1+) present.  ?   Left lower leg: Edema (1+) present.  ?Skin: ?   General: Skin is warm and dry.  ?   Capillary Refill: Capillary refill takes less than 2 seconds.  ?Neurological:  ?   General: No focal deficit present.  ?   Mental Status: He is alert and oriented to person, place, and time.  ?   Cranial Nerves: No cranial nerve deficit.  ?   Sensory: No sensory deficit.  ?   Motor: No weakness.  ?   Coordination: Coordination normal.  ?   Gait: Gait normal.  ?   Comments: No dysmetria on finger-to-nose testing b/l.  ?Psychiatric:     ?   Mood and Affect: Mood normal.  ? ? ?ED Results / Procedures / Treatments   ?Labs ?(all labs ordered are listed, but only abnormal results are displayed) ?Labs Reviewed  ?COMPREHENSIVE METABOLIC PANEL - Abnormal; Notable for the following components:  ?    Result Value  ? Glucose, Bld 107 (*)   ? Creatinine, Ser 1.35 (*)   ? Alkaline Phosphatase 33 (*)   ? GFR, Estimated 55 (*)   ? All other components within normal limits  ?CBC WITH DIFFERENTIAL/PLATELET - Abnormal; Notable for the following components:  ? RBC 4.12 (*)   ? Hemoglobin 8.9 (*)   ? HCT 29.4 (*)   ? MCV 71.4  (*)   ? MCH 21.6 (*)   ? RDW 15.9 (*)   ? All other components within normal limits  ?POC OCCULT BLOOD, ED  ?TYPE AND SCREEN  ?ABO/RH  ? ? ?EKG ?EKG Interpretation ? ?Date/Time:  Monday Mar 12 2022 16:06:49 EDT ?Ventricular Rate:  75 ?PR Interval:  177 ?QRS Duration: 101 ?QT Interval:  385 ?QTC Calculation: 430 ?R Axis:   40 ?  Text Interpretation: Sinus rhythm Confirmed by Lennice Sites [phone]) on 03/12/2022 4:18:34 PM ? ?Radiology ?No results found. ? ?Procedures ?.EKG ? ?Date/Time: 03/12/2022 5:31 PM ?Performed by: Sondra Come, MD ?Authorized by: Lennice Sites, DO  ? ?ECG reviewed by ED Physician in the absence of a cardiologist: yes   ?Interpretation:  ?  Interpretation: normal   ?Rate:  ?  ECG rate:  75 ?  ECG rate assessment: normal   ?Rhythm:  ?  Rhythm: sinus rhythm   ?Ectopy:  ?  Ectopy: none   ?QRS:  ?  QRS axis:  Normal ?  QRS intervals:  Normal ?  QRS conduction: normal   ?ST segments:  ?  ST segments:  Normal ?T waves:  ?  T waves: normal   ?Q waves:  ?  Abnormal Q-waves: not present   ? ? ?Medications Ordered in ED ?Medications - No data to display ? ?ED Course/ Medical Decision Making/ A&P ?  ?                        ?Medical Decision Making ?Amount and/or Complexity of Data Reviewed ?ECG/medicine tests: ordered. ? ? ?76 year old male with a history of HTN, HLD presented to ED with 3 months of fatigue, dizziness, and dyspnea on exertion. ? ?On exam, the patient is afebrile and hemodynamically stable.  Clear lung sounds in all lung fields and normal cardiac exam.  Neurologic exam performed does not show any focal neurologic deficits.  Normal gait. ? ?Outpatient visit from patient's most recent PCP visit on 02/26/2022 was reviewed as well as testing that was ordered at that time.  He had a CT of the chest on 02/27/2022 which did not show any acute pulmonary embolism.  Did show moderate multivessel coronary artery calcification as well as some mild emphysema and a left thyroid nodule.  Chest x-ray on  02/26/2022 was within normal limits. ? ?Patient's last echo from 03/2020 reviewed which showed an EF of 60 to 65% with overall normal left and right ventricular function and mild mitral valve regurgitation. ? ?ECG obtained sh

## 2022-03-12 NOTE — ED Triage Notes (Signed)
Pt has continued SOB, fatigue and dizziness for 9 months. Pt has been having CBC drawn every few weeks and his CBC has been dropping. Pt cant see GI until June 16th. Pt has been having dark stools. ?

## 2022-03-12 NOTE — Discharge Instructions (Addendum)
Continue your iron supplementation as prescribed. ?Please follow-up with your primary care doctor in 1-2 weeks. Please follow-up with your Gastroenterologist as planned in June. ?Please return to the Emergency Department if you develop chest pain, severe shortness of breath, lightheadedness, or any other concerning symptoms. ?

## 2022-03-12 NOTE — ED Provider Triage Note (Signed)
Emergency Medicine Provider Triage Evaluation Note ? ?Oscar Dixon , a 76 y.o. male  was evaluated in triage.  Pt complains of shortness of breath, fatigue, dizziness and dark-colored stool.  Reports that the symptoms have been going on for the last few months however have been getting progressively worse.  Patient states that his dark-colored stools have been more consistent over the last few weeks.  Patient states that he has been having serial CBCs drawn on a weekly basis.  Today's hemoglobin was 8.5; patient was advised to [facility] to [facility] for further evaluation.  Patient states that he has a appointment with [facility] gastroenterology however has not been able to see them yet. ? ? ? ?Review of Systems  ?Positive: Shortness of breath, fatigue, dizziness, dark-colored stool ?Negative: Abdominal pain, constipation, diarrhea, blood in stool, syncope ? ?Physical Exam  ?BP 140/76 (BP Location: Right Arm)   Pulse 88   Temp 98.4 ?F (36.9 ?C) (Oral)   Resp 18   Ht '6\' 2"'$  (1.88 m)   Wt 90.7 kg   SpO2 98%   BMI 25.68 kg/m?  ?Gen:   Awake, no distress   ?Resp:  Normal effort  ?MSK:   Moves extremities without difficulty  ?Other:  Abdomen soft, nondistended, nontender no guarding or rebound tenderness. ? ?Medical Decision Making  ?Medically screening exam initiated at 2:50 PM.  Appropriate orders placed.  Oscar Dixon was informed that the remainder of the evaluation will be completed by another provider, this initial triage assessment does not replace that evaluation, and the importance of remaining in the ED until their evaluation is complete. ? ? ?  ?Loni Beckwith, PA-C ?03/12/22 1452 ? ?

## 2022-03-19 ENCOUNTER — Other Ambulatory Visit: Payer: Self-pay | Admitting: Internal Medicine

## 2022-03-19 ENCOUNTER — Other Ambulatory Visit (INDEPENDENT_AMBULATORY_CARE_PROVIDER_SITE_OTHER): Payer: Medicare Other

## 2022-03-19 ENCOUNTER — Telehealth: Payer: Self-pay | Admitting: Pharmacy Technician

## 2022-03-19 ENCOUNTER — Encounter: Payer: Self-pay | Admitting: Internal Medicine

## 2022-03-19 DIAGNOSIS — D509 Iron deficiency anemia, unspecified: Secondary | ICD-10-CM | POA: Diagnosis not present

## 2022-03-19 DIAGNOSIS — D500 Iron deficiency anemia secondary to blood loss (chronic): Secondary | ICD-10-CM

## 2022-03-19 LAB — CBC WITH DIFFERENTIAL/PLATELET
Basophils Absolute: 0.2 10*3/uL — ABNORMAL HIGH (ref 0.0–0.1)
Basophils Relative: 2.5 % (ref 0.0–3.0)
Eosinophils Absolute: 0.3 10*3/uL (ref 0.0–0.7)
Eosinophils Relative: 5 % (ref 0.0–5.0)
HCT: 26.4 % — ABNORMAL LOW (ref 39.0–52.0)
Hemoglobin: 8.3 g/dL — ABNORMAL LOW (ref 13.0–17.0)
Lymphocytes Relative: 24.7 % (ref 12.0–46.0)
Lymphs Abs: 1.5 10*3/uL (ref 0.7–4.0)
MCHC: 31.6 g/dL (ref 30.0–36.0)
MCV: 67.8 fl — ABNORMAL LOW (ref 78.0–100.0)
Monocytes Absolute: 0.4 10*3/uL (ref 0.1–1.0)
Monocytes Relative: 6.4 % (ref 3.0–12.0)
Neutro Abs: 3.8 10*3/uL (ref 1.4–7.7)
Neutrophils Relative %: 61.4 % (ref 43.0–77.0)
Platelets: 338 10*3/uL (ref 150.0–400.0)
RBC: 3.89 Mil/uL — ABNORMAL LOW (ref 4.22–5.81)
RDW: 16.6 % — ABNORMAL HIGH (ref 11.5–15.5)
WBC: 6.1 10*3/uL (ref 4.0–10.5)

## 2022-03-19 NOTE — Telephone Encounter (Signed)
Auth Submission: NO AUTH NEEDED ?Payer: [employer] MEDICARE ?Medication & CPT/J Code(s) submitted: Feraheme (ferumoxytol) [id number] ?Route of submission (phone, fax, portal): PHONE ?Auth type: Buy/Bill ?Units/visits requested: 2 VISITS ?Reference number: [id number] ?Approval from: 03/19/22 to 07/20/22  ?

## 2022-03-20 ENCOUNTER — Ambulatory Visit
Admission: RE | Admit: 2022-03-20 | Discharge: 2022-03-20 | Disposition: A | Discharge status: Home / Self Care | Payer: Medicare Other | Source: Ambulatory Visit | Attending: Internal Medicine | Admitting: Internal Medicine

## 2022-03-20 ENCOUNTER — Other Ambulatory Visit (HOSPITAL_COMMUNITY)
Admission: RE | Admit: 2022-03-20 | Discharge: 2022-03-20 | Disposition: A | Discharge status: Home / Self Care | Payer: Medicare Other | Source: Ambulatory Visit | Attending: Internal Medicine | Admitting: Internal Medicine

## 2022-03-20 DIAGNOSIS — E041 Nontoxic single thyroid nodule: Secondary | ICD-10-CM

## 2022-03-21 LAB — CYTOLOGY - NON PAP

## 2022-03-22 ENCOUNTER — Ambulatory Visit (INDEPENDENT_AMBULATORY_CARE_PROVIDER_SITE_OTHER): Payer: Medicare Other

## 2022-03-22 VITALS — BP 117/75 | HR 70 | Temp 98.4°F | Resp 16 | Ht 74.0 in | Wt 197.0 lb

## 2022-03-22 DIAGNOSIS — D500 Iron deficiency anemia secondary to blood loss (chronic): Secondary | ICD-10-CM

## 2022-03-22 MED ORDER — SODIUM CHLORIDE 0.9 % IV SOLN
510.0000 mg | Freq: Once | INTRAVENOUS | Status: AC
  Administered 2022-03-22: 510 mg via INTRAVENOUS
  Filled 2022-03-22: qty 17

## 2022-03-22 NOTE — Progress Notes (Signed)
Diagnosis: Iron Deficiency Anemia  Provider:  Marshell Garfinkel, MD  Procedure: Infusion  IV Type: Peripheral, IV Location: L Antecubital  Feraheme (Ferumoxytol), Dose: 510 mg  Infusion Start Time: 7619  Infusion Stop Time: 5093  Post Infusion IV Care: Observation period completed  Discharge: Condition: Good, Destination: Home . AVS provided to patient.   Performed by:  Paul Dykes, RN

## 2022-03-26 ENCOUNTER — Telehealth: Payer: Self-pay

## 2022-03-26 ENCOUNTER — Other Ambulatory Visit (INDEPENDENT_AMBULATORY_CARE_PROVIDER_SITE_OTHER): Payer: Medicare Other

## 2022-03-26 ENCOUNTER — Encounter: Payer: Self-pay | Admitting: Internal Medicine

## 2022-03-26 DIAGNOSIS — D509 Iron deficiency anemia, unspecified: Secondary | ICD-10-CM | POA: Diagnosis not present

## 2022-03-26 LAB — CBC WITH DIFFERENTIAL/PLATELET
Basophils Absolute: 0.1 10*3/uL (ref 0.0–0.1)
Basophils Relative: 1.8 % (ref 0.0–3.0)
Eosinophils Absolute: 0.6 10*3/uL (ref 0.0–0.7)
Eosinophils Relative: 9.1 % — ABNORMAL HIGH (ref 0.0–5.0)
HCT: 24.3 % — ABNORMAL LOW (ref 39.0–52.0)
Hemoglobin: 7.6 g/dL — CL (ref 13.0–17.0)
Lymphocytes Relative: 19.1 % (ref 12.0–46.0)
Lymphs Abs: 1.3 10*3/uL (ref 0.7–4.0)
MCHC: 31.5 g/dL (ref 30.0–36.0)
MCV: 69.3 fl — ABNORMAL LOW (ref 78.0–100.0)
Monocytes Absolute: 0.4 10*3/uL (ref 0.1–1.0)
Monocytes Relative: 5.4 % (ref 3.0–12.0)
Neutro Abs: 4.5 10*3/uL (ref 1.4–7.7)
Neutrophils Relative %: 64.6 % (ref 43.0–77.0)
Platelets: 333 10*3/uL (ref 150.0–400.0)
RBC: 3.5 Mil/uL — ABNORMAL LOW (ref 4.22–5.81)
RDW: 17.1 % — ABNORMAL HIGH (ref 11.5–15.5)
WBC: 7 10*3/uL (ref 4.0–10.5)

## 2022-03-26 NOTE — Telephone Encounter (Signed)
Ok to continue the plan to start IV iron asap - I have ordered, and f/u CBC in 1 wk

## 2022-03-26 NOTE — Telephone Encounter (Signed)
Pt is calling regarding the critical lab value.  Pt was heading out of town but has turned around to head back home until he hears what he should do about his 7.6 HGB.   Please advise

## 2022-03-26 NOTE — Telephone Encounter (Signed)
Critical hemoglobin of 7.6. Delorise Jackson from [facility] called with status. Dr.John informed.

## 2022-03-30 ENCOUNTER — Encounter: Payer: Self-pay | Admitting: Internal Medicine

## 2022-03-30 ENCOUNTER — Ambulatory Visit (INDEPENDENT_AMBULATORY_CARE_PROVIDER_SITE_OTHER): Payer: Medicare Other

## 2022-03-30 VITALS — BP 122/74 | HR 72 | Temp 98.2°F | Resp 18 | Ht 74.0 in | Wt 195.2 lb

## 2022-03-30 DIAGNOSIS — D500 Iron deficiency anemia secondary to blood loss (chronic): Secondary | ICD-10-CM | POA: Diagnosis not present

## 2022-03-30 MED ORDER — SODIUM CHLORIDE 0.9 % IV SOLN
510.0000 mg | Freq: Once | INTRAVENOUS | Status: AC
  Administered 2022-03-30: 510 mg via INTRAVENOUS
  Filled 2022-03-30: qty 17

## 2022-03-30 NOTE — Progress Notes (Signed)
Diagnosis: Iron Deficiency Anemia  Provider:  Marshell Garfinkel, MD  Procedure: Infusion  IV Type: Peripheral, IV Location: L Antecubital  Feraheme (Ferumoxytol), Dose: 510 mg  Infusion Start Time: 1101  Infusion Stop Time: 1122  Post Infusion IV Care: Peripheral IV Discontinued  Discharge: Condition: Good, Destination: Home . AVS declined.  Performed by:  Adelina Mings, LPN

## 2022-04-03 ENCOUNTER — Other Ambulatory Visit (INDEPENDENT_AMBULATORY_CARE_PROVIDER_SITE_OTHER): Payer: Medicare Other

## 2022-04-03 ENCOUNTER — Encounter: Payer: Self-pay | Admitting: Internal Medicine

## 2022-04-03 DIAGNOSIS — D509 Iron deficiency anemia, unspecified: Secondary | ICD-10-CM | POA: Diagnosis not present

## 2022-04-03 LAB — CBC WITH DIFFERENTIAL/PLATELET
Basophils Absolute: 0.1 10*3/uL (ref 0.0–0.1)
Basophils Relative: 1.8 % (ref 0.0–3.0)
Eosinophils Absolute: 0.6 10*3/uL (ref 0.0–0.7)
Eosinophils Relative: 8.6 % — ABNORMAL HIGH (ref 0.0–5.0)
HCT: 30.2 % — ABNORMAL LOW (ref 39.0–52.0)
Hemoglobin: 9.7 g/dL — ABNORMAL LOW (ref 13.0–17.0)
Lymphocytes Relative: 21.7 % (ref 12.0–46.0)
Lymphs Abs: 1.4 10*3/uL (ref 0.7–4.0)
MCHC: 32.1 g/dL (ref 30.0–36.0)
MCV: 76.1 fl — ABNORMAL LOW (ref 78.0–100.0)
Monocytes Absolute: 0.4 10*3/uL (ref 0.1–1.0)
Monocytes Relative: 6.4 % (ref 3.0–12.0)
Neutro Abs: 4 10*3/uL (ref 1.4–7.7)
Neutrophils Relative %: 61.5 % (ref 43.0–77.0)
Platelets: 284 10*3/uL (ref 150.0–400.0)
RBC: 3.96 Mil/uL — ABNORMAL LOW (ref 4.22–5.81)
RDW: 28.3 % — ABNORMAL HIGH (ref 11.5–15.5)
WBC: 6.5 10*3/uL (ref 4.0–10.5)

## 2022-04-03 MED ORDER — ZOLPIDEM TARTRATE 5 MG PO TABS: 5.0000 mg | ORAL_TABLET | Freq: Every evening | ORAL | 1 refills | Status: DC | PRN

## 2022-04-09 ENCOUNTER — Other Ambulatory Visit (INDEPENDENT_AMBULATORY_CARE_PROVIDER_SITE_OTHER): Payer: Medicare Other

## 2022-04-09 DIAGNOSIS — D509 Iron deficiency anemia, unspecified: Secondary | ICD-10-CM | POA: Diagnosis not present

## 2022-04-09 LAB — CBC WITH DIFFERENTIAL/PLATELET
Basophils Absolute: 0.1 10*3/uL (ref 0.0–0.1)
Basophils Relative: 1.7 % (ref 0.0–3.0)
Eosinophils Absolute: 0.4 10*3/uL (ref 0.0–0.7)
Eosinophils Relative: 7.6 % — ABNORMAL HIGH (ref 0.0–5.0)
HCT: 32.6 % — ABNORMAL LOW (ref 39.0–52.0)
Hemoglobin: 10.4 g/dL — ABNORMAL LOW (ref 13.0–17.0)
Lymphocytes Relative: 21.9 % (ref 12.0–46.0)
Lymphs Abs: 1.3 10*3/uL (ref 0.7–4.0)
MCHC: 32 g/dL (ref 30.0–36.0)
MCV: 79.4 fl (ref 78.0–100.0)
Monocytes Absolute: 0.4 10*3/uL (ref 0.1–1.0)
Monocytes Relative: 7.6 % (ref 3.0–12.0)
Neutro Abs: 3.6 10*3/uL (ref 1.4–7.7)
Neutrophils Relative %: 61.2 % (ref 43.0–77.0)
Platelets: 289 10*3/uL (ref 150.0–400.0)
RBC: 4.1 Mil/uL — ABNORMAL LOW (ref 4.22–5.81)
RDW: 30.9 % — ABNORMAL HIGH (ref 11.5–15.5)
WBC: 5.9 10*3/uL (ref 4.0–10.5)

## 2022-04-16 ENCOUNTER — Other Ambulatory Visit (INDEPENDENT_AMBULATORY_CARE_PROVIDER_SITE_OTHER): Payer: Medicare Other

## 2022-04-16 DIAGNOSIS — D509 Iron deficiency anemia, unspecified: Secondary | ICD-10-CM

## 2022-04-16 LAB — CBC WITH DIFFERENTIAL/PLATELET
Basophils Absolute: 0.1 10*3/uL (ref 0.0–0.1)
Basophils Relative: 2.5 % (ref 0.0–3.0)
Eosinophils Absolute: 0.7 10*3/uL (ref 0.0–0.7)
Eosinophils Relative: 13.1 % — ABNORMAL HIGH (ref 0.0–5.0)
HCT: 31.9 % — ABNORMAL LOW (ref 39.0–52.0)
Hemoglobin: 10.2 g/dL — ABNORMAL LOW (ref 13.0–17.0)
Lymphocytes Relative: 22 % (ref 12.0–46.0)
Lymphs Abs: 1.1 10*3/uL (ref 0.7–4.0)
MCHC: 31.8 g/dL (ref 30.0–36.0)
MCV: 81 fl (ref 78.0–100.0)
Monocytes Absolute: 0.3 10*3/uL (ref 0.1–1.0)
Monocytes Relative: 6 % (ref 3.0–12.0)
Neutro Abs: 2.9 10*3/uL (ref 1.4–7.7)
Neutrophils Relative %: 56.4 % (ref 43.0–77.0)
Platelets: 229 10*3/uL (ref 150.0–400.0)
RBC: 3.94 Mil/uL — ABNORMAL LOW (ref 4.22–5.81)
RDW: 29.2 % — ABNORMAL HIGH (ref 11.5–15.5)
WBC: 5.2 10*3/uL (ref 4.0–10.5)

## 2022-04-17 ENCOUNTER — Ambulatory Visit: Payer: Medicare Other | Admitting: Gastroenterology

## 2022-04-17 ENCOUNTER — Encounter: Payer: Self-pay | Admitting: Gastroenterology

## 2022-04-17 VITALS — BP 118/62 | HR 77 | Ht 74.0 in | Wt 197.4 lb

## 2022-04-17 DIAGNOSIS — R634 Abnormal weight loss: Secondary | ICD-10-CM

## 2022-04-17 DIAGNOSIS — D509 Iron deficiency anemia, unspecified: Secondary | ICD-10-CM

## 2022-04-17 DIAGNOSIS — R6881 Early satiety: Secondary | ICD-10-CM | POA: Diagnosis not present

## 2022-04-17 DIAGNOSIS — R5383 Other fatigue: Secondary | ICD-10-CM

## 2022-04-17 NOTE — Progress Notes (Unsigned)
[place] VISIT   Primary Care Provider Biagio Borg, MD [place] [address] [phone]   Patient Profile: Oscar Dixon is a 76 y.o. male with a pmh significant for hypertension, hyperlipidemia, GERD, colon polyps, diverticulosis, hemorrhoids.  The patient presents to the [facility] for an evaluation and management of problem(s) noted below:  Problem List 1. Iron deficiency anemia, unspecified iron deficiency anemia type   2. Early satiety   3. Unintentional weight loss   4. Fatigue, unspecified type     History of Present Illness This is the patient's first visit to the outpatient [facility].  I met the patient last year for a screening/surveillance colonoscopy.  He was found to have.  The patient over the course of the last few months has been found to have iron deficient anemia.  He has required oral iron as well as iron infusions.  Over this time he has had some improvement in his anemia numbers, but continues to deal with fatigue as well as dizziness.  He has bloating that occurs at times and rarely has had early satiety.  He has had a 8 pound weight loss over the course of the last year which has been unintentional.  His GERD symptoms are controlled with current PPI therapy.  He takes it on an as-needed basis and is not consistent.  The patient denies any overt dysphagia or odynophagia symptoms.  He has never had an upper endoscopy.  GI Review of Systems Positive as above including early satiety Negative for nausea, vomiting, change in bowel habits, melena, hematochezia  Review of Systems General: Denies fevers/chills Cardiovascular: Denies chest pain Pulmonary: Denies shortness of breath Gastroenterological: See HPI Genitourinary: Denies darkened urine or hematuria Hematological: Denies easy bruising/bleeding Endocrine: Denies temperature intolerance Dermatological: Denies  jaundice Psychological: Mood is stable   Medications Current Outpatient Medications  Medication Sig Dispense Refill   albuterol (PROAIR HFA) 108 (90 Base) MCG/ACT inhaler Inhale 2 puffs into the lungs every 6 (six) hours as needed for wheezing or shortness of breath. 8 g 5   amLODipine (NORVASC) 10 MG tablet TAKE 1 TABLET(10 MG) BY MOUTH DAILY 90 tablet 2   aspirin 81 MG EC tablet Take 81 mg by mouth daily.     benazepril (LOTENSIN) 40 MG tablet TAKE 1 TABLET(40 MG) BY MOUTH DAILY 90 tablet 3   Cholecalciferol (VITAMIN D3 PO) Take 2,000 Units by mouth daily.     esomeprazole (NEXIUM) 40 MG capsule TAKE 1 CAPSULE BY MOUTH EVERY DAY AT NOON 90 capsule 2   iron polysaccharides (NU-IRON) 150 MG capsule Take 1 capsule (150 mg total) by mouth daily. 90 capsule 1   rosuvastatin (CRESTOR) 20 MG tablet TAKE 1 TABLET(20 MG) BY MOUTH DAILY 90 tablet 3   zolpidem (AMBIEN) 5 MG tablet Take 1 tablet (5 mg total) by mouth at bedtime as needed. for sleep 90 tablet 1   esomeprazole (NEXIUM) 40 MG packet Take 40 mg by mouth in the morning and at bedtime. 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   Arthritis    LEFT  hand   CARPAL TUNNEL SYNDROME, BILATERAL 12/15/2009   Cataract    bilateral sx   DYSPNEA ON EXERTION 12/15/2009   Erectile dysfunction 04/12/2012   FATIGUE 03/27/2010   GERD 05/20/2007   on meds   GOUT 03/27/2010   HAND PAIN 12/15/2009   Headache(784.0) 26/94/8546   HERNIA, UMBILICAL 2/70/3500   HYPERLIPIDEMIA 07/09/2008   on meds   HYPERTENSION 05/20/2007   on meds   LIBIDO, DECREASED 03/27/2010   Mitral regurgitation 09/12/2011   Other specified  forms of hearing loss 06/30/2010   Past Surgical History:  Procedure Laterality Date   ANKLE SURGERY Left 1967   CATARACT EXTRACTION Bilateral    COLONOSCOPY  2016   TA's -suprep (exc)   ELBOW SURGERY Right 1970   POLYPECTOMY  2016   TA's    s/p basal cell cancer     UMBILICAL HERNIA REPAIR     Social History   Socioeconomic History   Marital status: Married    Spouse name: Not on file   Number of children: 3   Years of education: college   Highest education level: Not on file  Occupational History   Occupation: Museum/gallery curator   Tobacco Use   Smoking status: Former   Smokeless tobacco: Never  Scientific laboratory technician Use: Never used  Substance and Sexual Activity   Alcohol use: Yes    Alcohol/week: 7.0 standard drinks of alcohol    Types: 7 Standard drinks or equivalent per week   Drug use: No   Sexual activity: Not on file  Other Topics Concern   Not on file  Social History Narrative   Lives w/ wife   Caffeine use: 2 cups coffee every am   Right handed    Social Determinants of Health   Financial Resource Strain: Not on file  Food Insecurity: Not on file  Transportation Needs: Not on file  Physical Activity: Not on file  Stress: Not on file  Social Connections: Not on file  Intimate Partner Violence: Not on file   Family History  Problem Relation Age of Onset   Hypertension Mother    Alcohol abuse Mother        ETOH   Hypertension Father    Stroke Father 27   Lung cancer Brother 73   Colon polyps Neg Hx    Colon cancer Neg Hx    Esophageal cancer Neg Hx    Stomach cancer Neg Hx    Rectal cancer Neg Hx    Inflammatory bowel disease Neg Hx    Liver disease Neg Hx    Pancreatic cancer Neg Hx    I have reviewed his medical, social, and family history in detail and updated the electronic medical record as necessary.    PHYSICAL EXAMINATION  BP 118/62   Pulse 77   Ht _0  (1.88 m)   Wt 197 lb 6.4 oz (89.5 kg)   BMI 25.34 kg/m  Wt Readings from  Last 3 Encounters:  04/20/22 197 lb (89.4 kg)  04/17/22 197 lb 6.4 oz (89.5 kg)  03/30/22 195 lb 3.2 oz (88.5 kg)  GEN: NAD, appears stated age, doesn't appear chronically ill PSYCH: Cooperative, without pressured speech EYE: Conjunctivae pink, sclerae anicteric ENT: MMM CV: Nontachycardic RESP: No audible wheezing GI: NABS, soft, NT/ND, without rebound or guarding MSK/EXT: No lower extremity edema SKIN: No jaundice NEURO:  Alert & Oriented x 3, no focal deficits   REVIEW OF DATA  I reviewed the following data  at the time of this encounter:  GI Procedures and Studies  May 2022 colonoscopy - Hemorrhoids found on digital rectal exam. - The examined portion of the ileum was normal. - Four 4 to 6 mm polyps in the descending colon, in the transverse colon and in the cecum, removed with a cold snare. Resected and retrieved. - A few diverticula in the sigmoid colon. - Normal mucosa in the entire examined colon otherwise. - Non-bleeding non-thrombosed internal hemorrhoids.  Laboratory Studies  Reviewed those in epic  Imaging Studies  No imaging studies to review   ASSESSMENT  Mr. Mccamish is a 76 y.o. male with a pmh significant for hypertension, hyperlipidemia, GERD, colon polyps, diverticulosis, hemorrhoids.  The patient is seen today for evaluation and management of:  1. Iron deficiency anemia, unspecified iron deficiency anemia type   2. Early satiety   3. Unintentional weight loss   4. Fatigue, unspecified type    The patient is hemodynamically stable.  Clinically, he has evidence of an iron deficiency anemia of unclear etiology.  He had a recent full colonoscopy within the last year thus making colon cancer very unlikely and other etiologies within the colon unlikely.  He has history of GERD symptoms that are controlled as well as some early satiety symptoms.  An upper endoscopy is recommended.  In the setting of [facility] to exclude other etiologies, it is reasonable for [place] to  also perform a full enteroscopy.  Push, to define if there is any other etiology for the iron deficiency.  If he is not found to have a source of iron deficiency anemia from a GI standpoint with the push enteroscopy, then a video capsule endoscopy will likely be considered.  If that is pursued and no other etiology is found then referral to hematology will be necessary.  We will add on iron studies for his next week's labs that he is going to have drawn.  He may benefit from further intravenous iron infusions.  The risks and benefits of endoscopic evaluation were discussed with the patient; these include but are not limited to the risk of perforation, infection, bleeding, missed lesions, lack of diagnosis, severe illness requiring hospitalization, as well as anesthesia and sedation related illnesses.  The patient and/or family is agreeable to proceed.  All patient questions were answered to the best of my ability, and the patient agrees to the aforementioned plan of action with follow-up as indicated.   PLAN  Proceed with scheduling SBE Continue current PPI dosing as needed Iron indices to be drawn next week at already planned follow-up labs If work-up is unremarkable then will consider video capsule endoscopy and the role of potential hematology referral   Orders Placed This Encounter  Procedures   IBC + Ferritin   Ambulatory referral to Gastroenterology    New Prescriptions   ESOMEPRAZOLE (NEXIUM) 40 MG PACKET    Take 40 mg by mouth in the morning and at bedtime.   Modified Medications   No medications on file    Planned Follow Up No follow-ups on file.   Total Time in Face-to-Face and in Coordination of Care for patient including independent/personal interpretation/review of prior testing, medical history, examination, medication adjustment, communicating results with the patient directly, and documentation within the EHR is 25 minutes.   Justice Britain, MD [place]  [facility] Advanced Endoscopy Office # [phone]

## 2022-04-17 NOTE — Patient Instructions (Signed)
Your provider has requested that you go to the basement level for labs next week. Press "B" on the elevator. The lab is located at the first door on the left as you exit the elevator.  You have been scheduled for an endoscopy. Please follow written instructions given to you at your visit today. If you use inhalers (even only as needed), please bring them with you on the day of your procedure.  If you are age 74 or older, your body mass index should be between 23-30. Your Body mass index is 25.34 kg/m. If this is out of the aforementioned range listed, please consider follow up with your Primary Care Provider.  If you are age 75 or younger, your body mass index should be between 19-25. Your Body mass index is 25.34 kg/m. If this is out of the aformentioned range listed, please consider follow up with your Primary Care Provider.   ________________________________________________________  The [facility] providers would like to encourage you to use [facility] to communicate with providers for non-urgent requests or questions.  Due to long hold times on the telephone, sending your provider a message by [facility] may be a faster and more efficient way to get a response.  Please allow 48 business hours for a response.  Please remember that this is for non-urgent requests.  _______________________________________________________  Thank you for choosing me and [facility].  Dr. Rush Landmark

## 2022-04-19 DIAGNOSIS — R634 Abnormal weight loss: Secondary | ICD-10-CM | POA: Insufficient documentation

## 2022-04-19 DIAGNOSIS — R5383 Other fatigue: Secondary | ICD-10-CM | POA: Insufficient documentation

## 2022-04-20 ENCOUNTER — Ambulatory Visit (AMBULATORY_SURGERY_CENTER): Payer: Medicare Other | Admitting: Gastroenterology

## 2022-04-20 ENCOUNTER — Encounter: Payer: Self-pay | Admitting: Gastroenterology

## 2022-04-20 VITALS — BP 121/65 | HR 73 | Temp 98.6°F | Resp 16 | Ht 74.0 in | Wt 197.0 lb

## 2022-04-20 DIAGNOSIS — K317 Polyp of stomach and duodenum: Secondary | ICD-10-CM

## 2022-04-20 DIAGNOSIS — K2980 Duodenitis without bleeding: Secondary | ICD-10-CM | POA: Diagnosis not present

## 2022-04-20 DIAGNOSIS — D509 Iron deficiency anemia, unspecified: Secondary | ICD-10-CM | POA: Diagnosis not present

## 2022-04-20 DIAGNOSIS — K2970 Gastritis, unspecified, without bleeding: Secondary | ICD-10-CM | POA: Diagnosis not present

## 2022-04-20 DIAGNOSIS — K2950 Unspecified chronic gastritis without bleeding: Secondary | ICD-10-CM | POA: Diagnosis not present

## 2022-04-20 DIAGNOSIS — E785 Hyperlipidemia, unspecified: Secondary | ICD-10-CM | POA: Diagnosis not present

## 2022-04-20 DIAGNOSIS — K449 Diaphragmatic hernia without obstruction or gangrene: Secondary | ICD-10-CM

## 2022-04-20 MED ORDER — SODIUM CHLORIDE 0.9 % IV SOLN: 500.0000 mL | Freq: Once | INTRAVENOUS | Status: DC

## 2022-04-20 MED ORDER — ESOMEPRAZOLE MAGNESIUM 40 MG PO PACK
40.0000 mg | PACK | Freq: Two times a day (BID) | ORAL | 11 refills | Status: DC
Start: 2022-04-20 — End: 2022-10-02

## 2022-04-20 NOTE — Progress Notes (Signed)
1500  Pt experienced laryngeal spasm with jaw thrust performed. vss

## 2022-04-20 NOTE — Progress Notes (Signed)
Report given to PACU, vss 

## 2022-04-20 NOTE — Progress Notes (Signed)
Called to room to assist during endoscopic procedure.  Patient ID and intended procedure confirmed with present staff. Received instructions for my participation in the procedure from the performing physician.  

## 2022-04-20 NOTE — Op Note (Signed)
[place] Patient Name: Izea Livolsi Procedure Date: 04/20/2022 2:54 PM MRN: [id number] Endoscopist: Justice Britain , MD Age: 76 Referring MD:  Date of Birth: 07/17/1946 Gender: Male Account #: [id number] Procedure:                Small bowel enteroscopy Indications:              Iron deficiency anemia Medicines:                Monitored Anesthesia Care Procedure:                Pre-Anesthesia Assessment:                           - Prior to the procedure, a History and Physical                            was performed, and patient medications and                            allergies were reviewed. The patient's tolerance of                            previous anesthesia was also reviewed. The risks                            and benefits of the procedure and the sedation                            options and risks were discussed with the patient.                            All questions were answered, and informed consent                            was obtained. Prior Anticoagulants: The patient has                            taken no previous anticoagulant or antiplatelet                            agents. ASA Grade Assessment: II - A patient with                            mild systemic disease. After reviewing the risks                            and benefits, the patient was deemed in                            satisfactory condition to undergo the procedure.                           After obtaining informed consent, the endoscope was  passed under direct vision. Throughout the                            procedure, the patient's blood pressure, pulse, and                            oxygen saturations were monitored continuously. The                            0441 [id number]-[id number] Slim SB Colonoscope was introduced                            through the mouth and advanced to the proximal                            jejunum. The small bowel  enteroscopy was somewhat                            difficult due to unusual anatomy. Successful                            completion of the procedure was aided by performing                            the maneuvers documented (below) in this report.                            The patient tolerated the procedure. Scope In: Scope Out: Findings:                 No gross lesions were noted in the entire esophagus.                           The Z-line was regular and was found 37 cm from the                            incisors.                           A large hiatal hernia was found. The proximal                            extent of the gastric folds (end of tubular                            esophagus) was 38 cm from the incisors. The hiatal                            narrowing was 50 cm from the incisors. The Z-line                            was a variable distance from incisors; the hiatal  hernia was sliding.                           Multiple dispersed erosions with no stigmata of                            recent bleeding were found in the cardia and in the                            gastric body. 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                           Patchy mildly erythematous mucosa without bleeding                            was found in the entire examined stomach. Biopsies                            were taken with a cold forceps for histology and                            Helicobacter pylori testing.                           A single 7 mm sessile polyp was found in the third                            portion of the duodenum. The polyp was removed with                            a cold snare. Resection and retrieval were                            complete. Area was tattooed with an injection of                            Spot  (carbon black).                           Normal mucosa was found in the entire duodenum                            otherwise.                           Normal mucosa was found in the visualized proximal  jejunum. The large hiatal hernia makes push                            enteroscopy more difficult as I get resistance.                            Area was tattooed with an injection of Spot (carbon                            black) to demarcate the distal extent of today's                            SBE. Complications:            No immediate complications. Estimated Blood Loss:     Estimated blood loss was minimal. Impression:               - No gross lesions in esophagus. Z-line regular, 37                            cm from the incisors.                           - Large hiatal hernia. 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 Overt bleeding ont present                            however.                           - Erythematous mucosa in the stomach. Biopsied.                           - A single duodenal polyp. Resected and retrieved.                            Tattooed proximally for demarcation purposes in                            future..                           - Normal mucosa was found in the entire examined                            duodenum otherwise.                           - Normal mucosa was found in the visualized  proximal jejunum. Hindered by large hiatal hernia                            from having a more significant push enteroscopy due                            to resistance. Tattooed distal extent. Recommendation:           - The patient will be observed post-procedure,                            until all discharge criteria are met.                           - Discharge patient to home.                            - Increase to Nexium 40 mg twice daily for next                            8-months.                           - Consider repeat EGD in 26-months to ensure healing                            of Cameron's erosions/ulcers.                           - Await pathology results.                           - If duodenal polyp is adenomatous, then 1-year                            follow up EGD/Enteroscopy to re-evaluate the site.                           - Obtain TTG IgA & IgA level with next set of labs                            (overt Celiac disease endoscopically is not present                            at this time and but would plan biopsy if                            serologies return positive.                           - Will discuss further with patient, about                            consideration of VCE vs continued monitoring of  Iron studies/CBC for a few more weeks before VCE                            (he has responded well so far).                           - The findings and recommendations were discussed                            with the patient.                           - The findings and recommendations were discussed                            with the patient's family. Justice Britain, MD 04/20/2022 3:26:07 PM

## 2022-04-20 NOTE — Progress Notes (Signed)
GASTROENTEROLOGY PROCEDURE H&P NOTE   Primary Care Physician: Biagio Borg, MD  HPI: Oscar Dixon is a 76 y.o. male who presents for Enteroscopy for IDA evaluation recent full colonoscopy.  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   Arthritis    LEFT hand   CARPAL TUNNEL SYNDROME, BILATERAL 12/15/2009   Cataract    bilateral sx   DYSPNEA ON EXERTION 12/15/2009   Erectile dysfunction 04/12/2012   FATIGUE 03/27/2010   GERD 05/20/2007   on meds   GOUT 03/27/2010   HAND PAIN 12/15/2009   Headache(784.0) 16/08/9603   HERNIA, UMBILICAL 5/40/9811   HYPERLIPIDEMIA 07/09/2008   on meds   HYPERTENSION 05/20/2007   on meds   LIBIDO, DECREASED 03/27/2010   Mitral regurgitation 09/12/2011   Other specified forms of hearing loss 06/30/2010   Past Surgical History:  Procedure Laterality Date   ANKLE SURGERY Left 1967   CATARACT EXTRACTION Bilateral    COLONOSCOPY  2016   TA's -suprep (exc)   ELBOW SURGERY Right 1970   POLYPECTOMY  2016   TA's    s/p basal cell cancer     UMBILICAL HERNIA REPAIR     Current Outpatient Medications  Medication Sig Dispense Refill   albuterol (PROAIR HFA) 108 (90 Base) MCG/ACT inhaler Inhale 2 puffs into the lungs every 6 (six) hours as needed for wheezing or shortness of breath. 8 g 5   amLODipine (NORVASC) 10 MG tablet TAKE 1 TABLET(10 MG) BY MOUTH DAILY 90 tablet 2   aspirin 81 MG EC tablet Take 81 mg by mouth daily.     benazepril (LOTENSIN) 40 MG tablet TAKE 1 TABLET(40 MG) BY MOUTH DAILY 90 tablet 3   Cholecalciferol (VITAMIN D3 PO) Take 2,000 Units by mouth daily.     esomeprazole (NEXIUM) 40 MG capsule TAKE 1 CAPSULE BY MOUTH EVERY DAY AT NOON 90 capsule 2   iron polysaccharides (NU-IRON) 150 MG capsule Take 1 capsule (150 mg total) by mouth daily. 90 capsule 1   rosuvastatin (CRESTOR) 20 MG tablet TAKE 1 TABLET(20 MG) BY MOUTH DAILY 90 tablet 3   zolpidem (AMBIEN) 5 MG tablet Take 1 tablet (5 mg total) by mouth at bedtime as  needed. for sleep 90 tablet 1   Current Facility-Administered Medications  Medication Dose Route Frequency Provider Last Rate Last Admin   0.9 %  sodium chloride infusion  500 mL Intravenous Once Mansouraty, Telford Nab., MD        Current Outpatient Medications:    albuterol (PROAIR HFA) 108 (90 Base) MCG/ACT inhaler, Inhale 2 puffs into the lungs every 6 (six) hours as needed for wheezing or shortness of breath., Disp: 8 g, Rfl: 5   amLODipine (NORVASC) 10 MG tablet, TAKE 1 TABLET(10 MG) BY MOUTH DAILY, Disp: 90 tablet, Rfl: 2   aspirin 81 MG EC tablet, Take 81 mg by mouth daily., Disp: , Rfl:    benazepril (LOTENSIN) 40 MG tablet, TAKE 1 TABLET(40 MG) BY MOUTH DAILY, Disp: 90 tablet, Rfl: 3   Cholecalciferol (VITAMIN D3 PO), Take 2,000 Units by mouth daily., Disp: , Rfl:    esomeprazole (NEXIUM) 40 MG capsule, TAKE 1 CAPSULE BY MOUTH EVERY DAY AT NOON, Disp: 90 capsule, Rfl: 2   iron polysaccharides (NU-IRON) 150 MG capsule, Take 1 capsule (150 mg total) by mouth daily., Disp: 90 capsule, Rfl: 1   rosuvastatin (CRESTOR) 20 MG tablet, TAKE 1 TABLET(20 MG) BY MOUTH DAILY, Disp: 90 tablet, Rfl: 3  zolpidem (AMBIEN) 5 MG tablet, Take 1 tablet (5 mg total) by mouth at bedtime as needed. for sleep, Disp: 90 tablet, Rfl: 1  Current Facility-Administered Medications:    0.9 %  sodium chloride infusion, 500 mL, Intravenous, Once, Mansouraty, Telford Nab., MD Allergies  Allergen Reactions   Augmentin [Amoxicillin-Pot Clavulanate] Diarrhea    diarrhea   Family History  Problem Relation Age of Onset   Hypertension Mother    Alcohol abuse Mother        ETOH   Hypertension Father    Stroke Father 10   Lung cancer Brother 65   Colon polyps Neg Hx    Colon cancer Neg Hx    Esophageal cancer Neg Hx    Stomach cancer Neg Hx    Rectal cancer Neg Hx    Social History   Socioeconomic History   Marital status: Married    Spouse name: Not on file   Number of children: 3   Years of education:  college   Highest education level: Not on file  Occupational History   Occupation: Museum/gallery curator   Tobacco Use   Smoking status: Former   Smokeless tobacco: Never  Scientific laboratory technician Use: Never used  Substance and Sexual Activity   Alcohol use: Yes    Alcohol/week: 7.0 standard drinks of alcohol    Types: 7 Standard drinks or equivalent per week   Drug use: No   Sexual activity: Not on file  Other Topics Concern   Not on file  Social History Narrative   Lives w/ wife   Caffeine use: 2 cups coffee every am   Right handed    Social Determinants of Health   Financial Resource Strain: Not on file  Food Insecurity: Not on file  Transportation Needs: Not on file  Physical Activity: Not on file  Stress: Not on file  Social Connections: Not on file  Intimate Partner Violence: Not on file    Physical Exam: Today's Vitals   04/20/22 1405  BP: 139/79  Pulse: 72  Temp: 98.6 F (37 C)  TempSrc: Skin  SpO2: 96%  Weight: 197 lb (89.4 kg)  Height: '6\' 2"'$  (1.88 m)   Body mass index is 25.29 kg/m. GEN: NAD EYE: Sclerae anicteric ENT: MMM CV: Non-tachycardic GI: Soft, NT/ND NEURO:  Alert & Oriented x 3  Lab Results: No results for input(s): "WBC", "HGB", "HCT", "PLT" in the last 72 hours. BMET No results for input(s): "NA", "K", "CL", "CO2", "GLUCOSE", "BUN", "CREATININE", "CALCIUM" in the last 72 hours. LFT No results for input(s): "PROT", "ALBUMIN", "AST", "ALT", "ALKPHOS", "BILITOT", "BILIDIR", "IBILI" in the last 72 hours. PT/INR No results for input(s): "LABPROT", "INR" in the last 72 hours.   Impression / Plan: This is a 76 y.o.male who presents for Enteroscopy for IDA evaluation recent full colonoscopy.  The risks and benefits of endoscopic evaluation/treatment were discussed with the patient and/or family; these include but are not limited to the risk of perforation, infection, bleeding, missed lesions, lack of diagnosis, severe illness requiring  hospitalization, as well as anesthesia and sedation related illnesses.  The patient's history has been reviewed, patient examined, no change in status, and deemed stable for procedure.  The patient and/or family is agreeable to proceed.    Justice Britain, MD [facility] Office # [phone]

## 2022-04-20 NOTE — Patient Instructions (Addendum)
YOU HAD AN ENDOSCOPIC PROCEDURE TODAY AT [facility]:   Refer to the procedure report that was given to you for any specific questions about what was found during the examination.  If the procedure report does not answer your questions, please call your gastroenterologist to clarify.  If you requested that your care partner not be given the details of your procedure findings, then the procedure report has been included in a sealed envelope for you to review at your convenience later.  YOU SHOULD EXPECT: Some feelings of bloating in the abdomen. Passage of more gas than usual.  Walking can help get rid of the air that was put into your GI tract during the procedure and reduce the bloating. If you had a lower endoscopy (such as a colonoscopy or flexible sigmoidoscopy) you may notice spotting of blood in your stool or on the toilet paper. If you underwent a bowel prep for your procedure, you may not have a normal bowel movement for a few days.  Please Note:  You might notice some irritation and congestion in your nose or some drainage.  This is from the oxygen used during your procedure.  There is no need for concern and it should clear up in a day or so.  SYMPTOMS TO REPORT IMMEDIATELY:   Following upper endoscopy (EGD)  Vomiting of blood or coffee ground material  New chest pain or pain under the shoulder blades  Painful or persistently difficult swallowing  New shortness of breath  Fever of 100F or higher  Black, tarry-looking stools  For urgent or emergent issues, a gastroenterologist can be reached at any hour by calling [phone]. Do not use MyChart messaging for urgent concerns.    DIET:  We do recommend a small meal at first, but then you may proceed to your regular diet.  Drink plenty of fluids but you should avoid alcoholic beverages for 24 hours.  MEDICATIONS: Increase Nexium to 40 mg by mouth twice daily for the next 2 months.  FOLLOW UP: Consider repeat EGD  in 4 months to ensure healing of Cameron's erosions/ulcers.  LABS: Repeat blood work on Monday per Dr. Donneta Romberg instructions.  ACTIVITY:  You should plan to take it easy for the rest of today and you should NOT DRIVE or use heavy machinery until tomorrow (because of the sedation medicines used during the test).    FOLLOW UP: Our staff will call the number listed on your records 24-72 hours following your procedure to check on you and address any questions or concerns that you may have regarding the information given to you following your procedure. If we do not reach you, we will leave a message.  We will attempt to reach you two times.  During this call, we will ask if you have developed any symptoms of COVID 19. If you develop any symptoms (ie: fever, flu-like symptoms, shortness of breath, cough etc.) before then, please call [phone].  If you test positive for Covid 19 in the 2 weeks post procedure, please call and report this information to [place].    If any biopsies were taken you will be contacted by phone or by letter within the next 1-3 weeks.  Please call us at [phone] if you have not heard about the biopsies in 3 weeks.    SIGNATURES/CONFIDENTIALITY: You and/or your care partner have signed paperwork which will be entered into your electronic medical record.  These signatures attest to the fact that that the information  above on your After Visit Summary has been reviewed and is understood.  Full responsibility of the confidentiality of this discharge information lies with you and/or your care-partner.  

## 2022-04-20 NOTE — Progress Notes (Signed)
1450 Robinul 0.1 mg IV given due large amount of secretions upon assessment.  MD made aware, vss

## 2022-04-20 NOTE — Progress Notes (Signed)
VS by CW  Pt's states no medical or surgical changes since previsit or office visit.  

## 2022-04-21 ENCOUNTER — Encounter: Payer: Self-pay | Admitting: Gastroenterology

## 2022-04-21 DIAGNOSIS — R6881 Early satiety: Secondary | ICD-10-CM | POA: Insufficient documentation

## 2022-04-23 ENCOUNTER — Encounter: Payer: Self-pay | Admitting: Gastroenterology

## 2022-04-23 ENCOUNTER — Other Ambulatory Visit: Payer: Self-pay

## 2022-04-23 ENCOUNTER — Other Ambulatory Visit (INDEPENDENT_AMBULATORY_CARE_PROVIDER_SITE_OTHER): Payer: Medicare Other

## 2022-04-23 ENCOUNTER — Telehealth: Payer: Self-pay

## 2022-04-23 DIAGNOSIS — D509 Iron deficiency anemia, unspecified: Secondary | ICD-10-CM | POA: Diagnosis not present

## 2022-04-23 LAB — CBC WITH DIFFERENTIAL/PLATELET
Basophils Absolute: 0.1 10*3/uL (ref 0.0–0.1)
Basophils Relative: 2.2 % (ref 0.0–3.0)
Eosinophils Absolute: 0.7 10*3/uL (ref 0.0–0.7)
Eosinophils Relative: 12.2 % — ABNORMAL HIGH (ref 0.0–5.0)
HCT: 33.1 % — ABNORMAL LOW (ref 39.0–52.0)
Hemoglobin: 10.8 g/dL — ABNORMAL LOW (ref 13.0–17.0)
Lymphocytes Relative: 23.7 % (ref 12.0–46.0)
Lymphs Abs: 1.4 10*3/uL (ref 0.7–4.0)
MCHC: 32.6 g/dL (ref 30.0–36.0)
MCV: 80.5 fl (ref 78.0–100.0)
Monocytes Absolute: 0.5 10*3/uL (ref 0.1–1.0)
Monocytes Relative: 8.1 % (ref 3.0–12.0)
Neutro Abs: 3.2 10*3/uL (ref 1.4–7.7)
Neutrophils Relative %: 53.8 % (ref 43.0–77.0)
Platelets: 237 10*3/uL (ref 150.0–400.0)
RBC: 4.11 Mil/uL — ABNORMAL LOW (ref 4.22–5.81)
RDW: 27.6 % — ABNORMAL HIGH (ref 11.5–15.5)
WBC: 5.8 10*3/uL (ref 4.0–10.5)

## 2022-04-23 LAB — IBC + FERRITIN
Ferritin: 26.5 ng/mL (ref 22.0–322.0)
Iron: 24 ug/dL — ABNORMAL LOW (ref 42–165)
Saturation Ratios: 5.9 % — ABNORMAL LOW (ref 20.0–50.0)
TIBC: 408.8 ug/dL (ref 250.0–450.0)
Transferrin: 292 mg/dL (ref 212.0–360.0)

## 2022-04-23 NOTE — Progress Notes (Signed)
Ok to contact pt  GI recommends repeat iron infusion to start late next wk - so I have ordered, hopefully he will hear soon about this

## 2022-04-23 NOTE — Telephone Encounter (Signed)
  Follow up Call-     04/20/2022    2:05 PM 03/28/2021   12:56 PM  Call back number  Post procedure Call Back phone  # [phone]- okay to talk to wife Jackelyn Poling [phone]  Permission to leave phone message Yes Yes     Patient questions:  Do you have a fever, pain , or abdominal swelling? No. Pain Score  0 *  Have you tolerated food without any problems? Yes.    Have you been able to return to your normal activities? Yes.    Do you have any questions about your discharge instructions: Diet   No. Medications  No. Follow up visit  No.  Do you have questions or concerns about your Care? No.  Actions: * If pain score is 4 or above: No action needed, pain <4.

## 2022-04-25 ENCOUNTER — Ambulatory Visit (INDEPENDENT_AMBULATORY_CARE_PROVIDER_SITE_OTHER): Payer: Medicare Other

## 2022-04-25 VITALS — BP 133/78 | HR 60 | Temp 98.1°F | Resp 16 | Ht 74.0 in | Wt 194.4 lb

## 2022-04-25 DIAGNOSIS — D500 Iron deficiency anemia secondary to blood loss (chronic): Secondary | ICD-10-CM

## 2022-04-25 MED ORDER — SODIUM CHLORIDE 0.9 % IV SOLN
510.0000 mg | Freq: Once | INTRAVENOUS | Status: AC
  Administered 2022-04-25: 510 mg via INTRAVENOUS
  Filled 2022-04-25: qty 17

## 2022-04-25 NOTE — Progress Notes (Signed)
Diagnosis: Iron Deficiency Anemia  Provider:  Marshell Garfinkel, MD  Procedure: Infusion  IV Type: Peripheral, IV Location: L Antecubital  Feraheme (Ferumoxytol), Dose: 510 mg  Infusion Start Time: 0908  Infusion Stop Time: 0930  Post Infusion IV Care: Peripheral IV Discontinued  Discharge: Condition: Good, Destination: Home . AVS provided to patient.   Performed by:  Adelina Mings, LPN

## 2022-04-26 ENCOUNTER — Encounter: Payer: Self-pay | Admitting: Gastroenterology

## 2022-04-26 ENCOUNTER — Encounter: Payer: Self-pay | Admitting: Cardiovascular Disease

## 2022-04-26 DIAGNOSIS — D509 Iron deficiency anemia, unspecified: Secondary | ICD-10-CM

## 2022-04-30 ENCOUNTER — Encounter: Payer: Self-pay | Admitting: Internal Medicine

## 2022-04-30 ENCOUNTER — Other Ambulatory Visit (INDEPENDENT_AMBULATORY_CARE_PROVIDER_SITE_OTHER): Payer: Medicare Other

## 2022-04-30 ENCOUNTER — Encounter: Payer: Self-pay | Admitting: Gastroenterology

## 2022-04-30 DIAGNOSIS — D509 Iron deficiency anemia, unspecified: Secondary | ICD-10-CM

## 2022-04-30 LAB — CBC WITH DIFFERENTIAL/PLATELET
Basophils Absolute: 0.1 10*3/uL (ref 0.0–0.1)
Basophils Absolute: 0.1 10*3/uL (ref 0.0–0.1)
Basophils Relative: 1.4 % (ref 0.0–3.0)
Basophils Relative: 1.9 % (ref 0.0–3.0)
Eosinophils Absolute: 0.5 10*3/uL (ref 0.0–0.7)
Eosinophils Absolute: 0.6 10*3/uL (ref 0.0–0.7)
Eosinophils Relative: 8.6 % — ABNORMAL HIGH (ref 0.0–5.0)
Eosinophils Relative: 9.4 % — ABNORMAL HIGH (ref 0.0–5.0)
HCT: 35.4 % — ABNORMAL LOW (ref 39.0–52.0)
HCT: 35.7 % — ABNORMAL LOW (ref 39.0–52.0)
Hemoglobin: 11.5 g/dL — ABNORMAL LOW (ref 13.0–17.0)
Hemoglobin: 11.5 g/dL — ABNORMAL LOW (ref 13.0–17.0)
Lymphocytes Relative: 23.4 % (ref 12.0–46.0)
Lymphocytes Relative: 23.7 % (ref 12.0–46.0)
Lymphs Abs: 1.5 10*3/uL (ref 0.7–4.0)
Lymphs Abs: 1.5 10*3/uL (ref 0.7–4.0)
MCHC: 32.2 g/dL (ref 30.0–36.0)
MCHC: 32.5 g/dL (ref 30.0–36.0)
MCV: 79.8 fl (ref 78.0–100.0)
MCV: 80.4 fl (ref 78.0–100.0)
Monocytes Absolute: 0.4 10*3/uL (ref 0.1–1.0)
Monocytes Absolute: 0.5 10*3/uL (ref 0.1–1.0)
Monocytes Relative: 6.9 % (ref 3.0–12.0)
Monocytes Relative: 7.2 % (ref 3.0–12.0)
Neutro Abs: 3.6 10*3/uL (ref 1.4–7.7)
Neutro Abs: 3.8 10*3/uL (ref 1.4–7.7)
Neutrophils Relative %: 57.8 % (ref 43.0–77.0)
Neutrophils Relative %: 59.7 % (ref 43.0–77.0)
Platelets: 242 10*3/uL (ref 150.0–400.0)
Platelets: 247 10*3/uL (ref 150.0–400.0)
RBC: 4.44 Mil/uL (ref 4.22–5.81)
RBC: 4.44 Mil/uL (ref 4.22–5.81)
RDW: 27 % — ABNORMAL HIGH (ref 11.5–15.5)
RDW: 27.3 % — ABNORMAL HIGH (ref 11.5–15.5)
WBC: 6.3 10*3/uL (ref 4.0–10.5)
WBC: 6.3 10*3/uL (ref 4.0–10.5)

## 2022-04-30 LAB — IBC + FERRITIN
Ferritin: 311.8 ng/mL (ref 22.0–322.0)
Iron: 143 ug/dL (ref 42–165)
Saturation Ratios: 38.5 % (ref 20.0–50.0)
TIBC: 371 ug/dL (ref 250.0–450.0)
Transferrin: 265 mg/dL (ref 212.0–360.0)

## 2022-05-01 ENCOUNTER — Encounter: Payer: Self-pay | Admitting: Gastroenterology

## 2022-05-02 ENCOUNTER — Ambulatory Visit (INDEPENDENT_AMBULATORY_CARE_PROVIDER_SITE_OTHER): Payer: Medicare Other

## 2022-05-02 VITALS — BP 136/85 | HR 61 | Temp 97.8°F | Resp 16 | Ht 74.0 in | Wt 195.4 lb

## 2022-05-02 DIAGNOSIS — D500 Iron deficiency anemia secondary to blood loss (chronic): Secondary | ICD-10-CM

## 2022-05-02 MED ORDER — SODIUM CHLORIDE 0.9 % IV SOLN
510.0000 mg | Freq: Once | INTRAVENOUS | Status: AC
  Administered 2022-05-02: 510 mg via INTRAVENOUS
  Filled 2022-05-02: qty 17

## 2022-05-02 NOTE — Progress Notes (Signed)
Diagnosis: Iron Deficiency Anemia  Provider:  Marshell Garfinkel, MD  Procedure: Infusion  IV Type: Peripheral, IV Location: L Antecubital  Feraheme (Ferumoxytol), Dose: 510 mg  Infusion Start Time: 0912  Infusion Stop Time: 0929  Post Infusion IV Care: Peripheral IV Discontinued  Discharge: Condition: Good, Destination: Home . AVS provided to patient.   Performed by:  Adelina Mings, LPN

## 2022-05-05 ENCOUNTER — Encounter: Payer: Self-pay | Admitting: Gastroenterology

## 2022-05-14 ENCOUNTER — Other Ambulatory Visit (INDEPENDENT_AMBULATORY_CARE_PROVIDER_SITE_OTHER): Payer: Medicare Other

## 2022-05-14 DIAGNOSIS — D509 Iron deficiency anemia, unspecified: Secondary | ICD-10-CM | POA: Diagnosis not present

## 2022-05-14 LAB — CBC WITH DIFFERENTIAL/PLATELET
Basophils Absolute: 0.1 10*3/uL (ref 0.0–0.1)
Basophils Relative: 1.6 % (ref 0.0–3.0)
Eosinophils Absolute: 0.5 10*3/uL (ref 0.0–0.7)
Eosinophils Relative: 8 % — ABNORMAL HIGH (ref 0.0–5.0)
HCT: 38.7 % — ABNORMAL LOW (ref 39.0–52.0)
Hemoglobin: 12.6 g/dL — ABNORMAL LOW (ref 13.0–17.0)
Lymphocytes Relative: 23 % (ref 12.0–46.0)
Lymphs Abs: 1.4 10*3/uL (ref 0.7–4.0)
MCHC: 32.4 g/dL (ref 30.0–36.0)
MCV: 83 fl (ref 78.0–100.0)
Monocytes Absolute: 0.4 10*3/uL (ref 0.1–1.0)
Monocytes Relative: 6.8 % (ref 3.0–12.0)
Neutro Abs: 3.6 10*3/uL (ref 1.4–7.7)
Neutrophils Relative %: 60.6 % (ref 43.0–77.0)
Platelets: 217 10*3/uL (ref 150.0–400.0)
RBC: 4.67 Mil/uL (ref 4.22–5.81)
RDW: 26.2 % — ABNORMAL HIGH (ref 11.5–15.5)
WBC: 5.9 10*3/uL (ref 4.0–10.5)

## 2022-05-15 ENCOUNTER — Telehealth: Payer: Self-pay

## 2022-05-15 LAB — IGA: Immunoglobulin A: 379 mg/dL — ABNORMAL HIGH (ref 70–320)

## 2022-05-15 NOTE — Telephone Encounter (Signed)
-----   Message from Irving Copas., MD sent at 05/15/2022  4:33 PM EDT ----- Regarding: See if we can add on a lab Meliton Samad, Can you see if the patient's labs that were obtained today could allow [place] to add a tissue transglutaminase IgA? The IgA level was able to be done so I suspect we should but please let me know if not possible, then please put it as a pended future order so that he can follow-up and have that done in the coming weeks. Thanks. GM

## 2022-05-16 ENCOUNTER — Other Ambulatory Visit: Payer: Self-pay

## 2022-05-16 DIAGNOSIS — D509 Iron deficiency anemia, unspecified: Secondary | ICD-10-CM

## 2022-05-16 DIAGNOSIS — R6881 Early satiety: Secondary | ICD-10-CM

## 2022-05-16 DIAGNOSIS — R634 Abnormal weight loss: Secondary | ICD-10-CM

## 2022-05-16 DIAGNOSIS — R5383 Other fatigue: Secondary | ICD-10-CM

## 2022-05-16 NOTE — Telephone Encounter (Signed)
Lab add on sent to the lab.  I did call and confirm that the lab could be ordered.

## 2022-05-28 ENCOUNTER — Encounter: Payer: Self-pay | Admitting: Internal Medicine

## 2022-05-28 ENCOUNTER — Other Ambulatory Visit (INDEPENDENT_AMBULATORY_CARE_PROVIDER_SITE_OTHER): Payer: Medicare Other

## 2022-05-28 DIAGNOSIS — R5383 Other fatigue: Secondary | ICD-10-CM

## 2022-05-28 DIAGNOSIS — R6881 Early satiety: Secondary | ICD-10-CM | POA: Diagnosis not present

## 2022-05-28 DIAGNOSIS — R634 Abnormal weight loss: Secondary | ICD-10-CM

## 2022-05-28 DIAGNOSIS — D509 Iron deficiency anemia, unspecified: Secondary | ICD-10-CM | POA: Diagnosis not present

## 2022-05-28 LAB — CBC WITH DIFFERENTIAL/PLATELET
Basophils Absolute: 0.1 10*3/uL (ref 0.0–0.1)
Basophils Relative: 1.1 % (ref 0.0–3.0)
Eosinophils Absolute: 0.5 10*3/uL (ref 0.0–0.7)
Eosinophils Relative: 9.4 % — ABNORMAL HIGH (ref 0.0–5.0)
HCT: 39.8 % (ref 39.0–52.0)
Hemoglobin: 13.3 g/dL (ref 13.0–17.0)
Lymphocytes Relative: 23.7 % (ref 12.0–46.0)
Lymphs Abs: 1.4 10*3/uL (ref 0.7–4.0)
MCHC: 33.5 g/dL (ref 30.0–36.0)
MCV: 83.3 fl (ref 78.0–100.0)
Monocytes Absolute: 0.5 10*3/uL (ref 0.1–1.0)
Monocytes Relative: 8.2 % (ref 3.0–12.0)
Neutro Abs: 3.3 10*3/uL (ref 1.4–7.7)
Neutrophils Relative %: 57.6 % (ref 43.0–77.0)
Platelets: 182 10*3/uL (ref 150.0–400.0)
RBC: 4.78 Mil/uL (ref 4.22–5.81)
RDW: 24.7 % — ABNORMAL HIGH (ref 11.5–15.5)
WBC: 5.8 10*3/uL (ref 4.0–10.5)

## 2022-05-29 LAB — TISSUE TRANSGLUTAMINASE, IGA: (tTG) Ab, IgA: <1 U/mL

## 2022-06-07 ENCOUNTER — Encounter: Payer: Self-pay | Admitting: Internal Medicine

## 2022-06-07 MED ORDER — AMLODIPINE BESYLATE 10 MG PO TABS: 10.0000 mg | ORAL_TABLET | Freq: Every day | ORAL | 1 refills | Status: DC

## 2022-06-11 ENCOUNTER — Other Ambulatory Visit (INDEPENDENT_AMBULATORY_CARE_PROVIDER_SITE_OTHER): Payer: Medicare Other

## 2022-06-11 DIAGNOSIS — D509 Iron deficiency anemia, unspecified: Secondary | ICD-10-CM | POA: Diagnosis not present

## 2022-06-11 LAB — CBC WITH DIFFERENTIAL/PLATELET
Basophils Absolute: 0.1 10*3/uL (ref 0.0–0.1)
Basophils Relative: 1 % (ref 0.0–3.0)
Eosinophils Absolute: 0.6 10*3/uL (ref 0.0–0.7)
Eosinophils Relative: 8.9 % — ABNORMAL HIGH (ref 0.0–5.0)
HCT: 39.8 % (ref 39.0–52.0)
Hemoglobin: 13.5 g/dL (ref 13.0–17.0)
Lymphocytes Relative: 22.6 % (ref 12.0–46.0)
Lymphs Abs: 1.6 10*3/uL (ref 0.7–4.0)
MCHC: 33.9 g/dL (ref 30.0–36.0)
MCV: 83.3 fl (ref 78.0–100.0)
Monocytes Absolute: 0.6 10*3/uL (ref 0.1–1.0)
Monocytes Relative: 8.1 % (ref 3.0–12.0)
Neutro Abs: 4.3 10*3/uL (ref 1.4–7.7)
Neutrophils Relative %: 59.4 % (ref 43.0–77.0)
Platelets: 193 10*3/uL (ref 150.0–400.0)
RBC: 4.77 Mil/uL (ref 4.22–5.81)
RDW: 22.7 % — ABNORMAL HIGH (ref 11.5–15.5)
WBC: 7.2 10*3/uL (ref 4.0–10.5)

## 2022-06-12 ENCOUNTER — Encounter: Payer: Self-pay | Admitting: Gastroenterology

## 2022-06-12 ENCOUNTER — Encounter: Payer: Self-pay | Admitting: Internal Medicine

## 2022-06-13 ENCOUNTER — Telehealth: Payer: Self-pay

## 2022-06-13 NOTE — Telephone Encounter (Signed)
Just to confirm this can be in the [place]?

## 2022-06-13 NOTE — Telephone Encounter (Signed)
Marthenia Rolling, RN      Previous Messages  Follow-Up to treatment (Newest Message First) View All Conversations on this Encounter Mansouraty, Telford Nab., MD  You 40 minutes ago (3:19 PM)    Yes. [facility] OK. Thanks. GM      Note    You routed conversation to Mansouraty, Telford Nab., MD 6 hours ago (9:44 AM)   You 6 hours ago (9:44 AM)    Just to confirm this can be in the [place]?      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  Thanks.  GM    Irving Copas., MD  [name] "Skipper" 11 hours ago (4:41 AM)    Mr. Fitz, Thanks for reaching out. Glad to hear that you have normalized her blood counts. Let us go ahead and plan for that repeat endoscopy to be scheduled in October.  Lets see how the stomach lining looks. He has no issues with continuing the oral iron supplement, I would just go ahead and continue that. I think I am okay with just seeing you for the follow-up endoscopy and seeing what the lining of the GI tract looks like first before setting up follow-up; however if you would like to talk more in clinic we can certainly work on setting that up. I will have my office staff work on arranging a follow-up endoscopy I believe our October schedule should be out we can work on scheduling that. Good luck and good health.   Justice Britain, MD    You routed conversation to Mansouraty, Telford Nab., MD Yesterday (3:01 PM)   [name] "Skipper"  [facility] Clinical Pool (supporting Irving Copas., MD) Yesterday (2:39 PM)   WS Dr Rush Landmark,   Had CBC yesterday. My primary physician indicated OK to stop doing the blood tests as readings are about at normal.  ("Should be ok to stop at this point but make to f/u with GI as they wanted to consider a repeat EGD in October 2023 to check and see if the erosions in the  stomach have healed")   Should I continue with my Iron supplement pills?  Should I make a follow-up appointment with you in the future?   Thank you.

## 2022-06-13 NOTE — Telephone Encounter (Signed)
Yes. [facility] OK. Thanks. GM

## 2022-06-18 NOTE — Telephone Encounter (Signed)
Left message on machine to call back  

## 2022-06-19 NOTE — Telephone Encounter (Signed)
The pt has been scheduled for pre visit and EGD on 08/07/22 at 830 am- previsit on 9/26 at 8 am.  The pt has been advised and will call if he has any concerns or questions.

## 2022-06-26 ENCOUNTER — Encounter: Payer: Self-pay | Admitting: Gastroenterology

## 2022-07-02 ENCOUNTER — Other Ambulatory Visit (INDEPENDENT_AMBULATORY_CARE_PROVIDER_SITE_OTHER): Payer: Medicare Other

## 2022-07-02 DIAGNOSIS — D509 Iron deficiency anemia, unspecified: Secondary | ICD-10-CM | POA: Diagnosis not present

## 2022-07-02 LAB — CBC WITH DIFFERENTIAL/PLATELET
Basophils Absolute: 0.1 10*3/uL (ref 0.0–0.1)
Basophils Relative: 1.3 % (ref 0.0–3.0)
Eosinophils Absolute: 0.7 10*3/uL (ref 0.0–0.7)
Eosinophils Relative: 11.4 % — ABNORMAL HIGH (ref 0.0–5.0)
HCT: 40.4 % (ref 39.0–52.0)
Hemoglobin: 13.5 g/dL (ref 13.0–17.0)
Lymphocytes Relative: 24.9 % (ref 12.0–46.0)
Lymphs Abs: 1.6 10*3/uL (ref 0.7–4.0)
MCHC: 33.4 g/dL (ref 30.0–36.0)
MCV: 85.4 fl (ref 78.0–100.0)
Monocytes Absolute: 0.5 10*3/uL (ref 0.1–1.0)
Monocytes Relative: 7 % (ref 3.0–12.0)
Neutro Abs: 3.6 10*3/uL (ref 1.4–7.7)
Neutrophils Relative %: 55.4 % (ref 43.0–77.0)
Platelets: 178 10*3/uL (ref 150.0–400.0)
RBC: 4.73 Mil/uL (ref 4.22–5.81)
RDW: 18.3 % — ABNORMAL HIGH (ref 11.5–15.5)
WBC: 6.5 10*3/uL (ref 4.0–10.5)

## 2022-07-16 ENCOUNTER — Encounter: Payer: Self-pay | Admitting: Internal Medicine

## 2022-07-18 NOTE — Telephone Encounter (Signed)
You can have both shots at the same time, but this more out of convenience than anything else, and I would not personally recommend it as if there was a reaction, you would not know which shot may be been the problem

## 2022-07-26 ENCOUNTER — Encounter: Payer: Self-pay | Admitting: Internal Medicine

## 2022-07-30 ENCOUNTER — Encounter: Payer: Self-pay | Admitting: Internal Medicine

## 2022-07-31 ENCOUNTER — Encounter: Payer: Self-pay | Admitting: Gastroenterology

## 2022-07-31 ENCOUNTER — Ambulatory Visit (AMBULATORY_SURGERY_CENTER): Payer: Self-pay

## 2022-07-31 ENCOUNTER — Other Ambulatory Visit (INDEPENDENT_AMBULATORY_CARE_PROVIDER_SITE_OTHER): Payer: Medicare Other

## 2022-07-31 VITALS — Ht 74.0 in | Wt 194.6 lb

## 2022-07-31 DIAGNOSIS — K259 Gastric ulcer, unspecified as acute or chronic, without hemorrhage or perforation: Secondary | ICD-10-CM

## 2022-07-31 DIAGNOSIS — D509 Iron deficiency anemia, unspecified: Secondary | ICD-10-CM

## 2022-07-31 LAB — CBC WITH DIFFERENTIAL/PLATELET
Basophils Absolute: 0.1 10*3/uL (ref 0.0–0.1)
Basophils Relative: 1.1 % (ref 0.0–3.0)
Eosinophils Absolute: 0.7 10*3/uL (ref 0.0–0.7)
Eosinophils Relative: 8.9 % — ABNORMAL HIGH (ref 0.0–5.0)
HCT: 39.5 % (ref 39.0–52.0)
Hemoglobin: 13.4 g/dL (ref 13.0–17.0)
Lymphocytes Relative: 18.5 % (ref 12.0–46.0)
Lymphs Abs: 1.5 10*3/uL (ref 0.7–4.0)
MCHC: 33.9 g/dL (ref 30.0–36.0)
MCV: 86.8 fl (ref 78.0–100.0)
Monocytes Absolute: 0.5 10*3/uL (ref 0.1–1.0)
Monocytes Relative: 6.1 % (ref 3.0–12.0)
Neutro Abs: 5.5 10*3/uL (ref 1.4–7.7)
Neutrophils Relative %: 65.4 % (ref 43.0–77.0)
Platelets: 184 10*3/uL (ref 150.0–400.0)
RBC: 4.56 Mil/uL (ref 4.22–5.81)
RDW: 16 % — ABNORMAL HIGH (ref 11.5–15.5)
WBC: 8.3 10*3/uL (ref 4.0–10.5)

## 2022-07-31 NOTE — Progress Notes (Signed)
No egg or soy allergy known to patient  No issues known to pt with past sedation with any surgeries or procedures Patient denies ever being told they had issues or difficulty with intubation  No FH of Malignant Hyperthermia Pt is not on diet pills Pt is not on  home 02  Pt is not on blood thinners  Pt denies issues with constipation  No A fib or A flutter Have any cardiac testing pending--NO

## 2022-08-05 ENCOUNTER — Encounter: Payer: Self-pay | Admitting: Gastroenterology

## 2022-08-06 ENCOUNTER — Telehealth: Payer: Self-pay | Admitting: Gastroenterology

## 2022-08-06 NOTE — Telephone Encounter (Signed)
Good Afternoon Dr. Rush Landmark,  Patient called stating that he needed to reschedule his EGD for 10/3 at 8:30 due to having a cold and coughing.  Patient was rescheduled for 11/1 at 9:30.

## 2022-08-06 NOTE — Telephone Encounter (Signed)
I am sorry to hear about this. Understood. In the interim he should continue his acid medications as I had previously asked him to. I will see him in November for his procedure. Thanks. GM

## 2022-08-07 ENCOUNTER — Encounter: Payer: Medicare Other | Admitting: Gastroenterology

## 2022-08-09 ENCOUNTER — Encounter: Payer: Self-pay | Admitting: Internal Medicine

## 2022-08-10 ENCOUNTER — Other Ambulatory Visit: Payer: Self-pay | Admitting: Internal Medicine

## 2022-08-28 ENCOUNTER — Encounter: Payer: Self-pay | Admitting: Internal Medicine

## 2022-08-28 ENCOUNTER — Telehealth: Payer: Self-pay | Admitting: Internal Medicine

## 2022-08-28 MED ORDER — HYDROCODONE BIT-HOMATROP MBR 5-1.5 MG/5ML PO SOLN: 5.0000 mL | Freq: Four times a day (QID) | ORAL | 0 refills | Status: AC | PRN

## 2022-08-28 MED ORDER — NIRMATRELVIR/RITONAVIR (PAXLOVID) TABLET (RENAL DOSING): 2.0000 | ORAL_TABLET | Freq: Two times a day (BID) | ORAL | 0 refills | Status: AC

## 2022-08-28 NOTE — Telephone Encounter (Signed)
Pt called to report home test showed COVID positive today. Pt reports symptoms began yesterday. Pt asking if Dr. Jenny Reichmann wants to call in any medication. Call pt CELL PHONE [phone]. Pt uses [facility].

## 2022-08-28 NOTE — Telephone Encounter (Signed)
LOV 04/23/22, do you recommend and OV before prescribing meds. Please advise

## 2022-08-29 NOTE — Telephone Encounter (Signed)
Patient states that he communicated with provider through [facility] and sent meds to pharmacy.

## 2022-09-05 ENCOUNTER — Telehealth: Payer: Self-pay | Admitting: Internal Medicine

## 2022-09-05 ENCOUNTER — Encounter: Payer: Self-pay | Admitting: Internal Medicine

## 2022-09-05 ENCOUNTER — Encounter: Payer: Medicare Other | Admitting: Gastroenterology

## 2022-09-05 ENCOUNTER — Encounter: Payer: Self-pay | Admitting: Gastroenterology

## 2022-09-05 ENCOUNTER — Ambulatory Visit: Payer: Medicare Other

## 2022-09-05 NOTE — Telephone Encounter (Signed)
LVM for pt to rtn my call to schedule AWV-I with [facility] call back # [phone] 

## 2022-09-06 ENCOUNTER — Other Ambulatory Visit: Payer: Self-pay

## 2022-09-06 ENCOUNTER — Ambulatory Visit (INDEPENDENT_AMBULATORY_CARE_PROVIDER_SITE_OTHER): Payer: Medicare Other | Admitting: Internal Medicine

## 2022-09-06 VITALS — BP 114/66 | HR 65 | Temp 97.8°F | Ht 74.0 in | Wt 191.0 lb

## 2022-09-06 DIAGNOSIS — R739 Hyperglycemia, unspecified: Secondary | ICD-10-CM

## 2022-09-06 DIAGNOSIS — R1030 Lower abdominal pain, unspecified: Secondary | ICD-10-CM

## 2022-09-06 DIAGNOSIS — E559 Vitamin D deficiency, unspecified: Secondary | ICD-10-CM | POA: Diagnosis not present

## 2022-09-06 DIAGNOSIS — E538 Deficiency of other specified B group vitamins: Secondary | ICD-10-CM | POA: Diagnosis not present

## 2022-09-06 DIAGNOSIS — N1831 Chronic kidney disease, stage 3a: Secondary | ICD-10-CM

## 2022-09-06 DIAGNOSIS — D509 Iron deficiency anemia, unspecified: Secondary | ICD-10-CM

## 2022-09-06 DIAGNOSIS — Z0001 Encounter for general adult medical examination with abnormal findings: Secondary | ICD-10-CM | POA: Insufficient documentation

## 2022-09-06 DIAGNOSIS — K219 Gastro-esophageal reflux disease without esophagitis: Secondary | ICD-10-CM

## 2022-09-06 DIAGNOSIS — I10 Essential (primary) hypertension: Secondary | ICD-10-CM | POA: Diagnosis not present

## 2022-09-06 DIAGNOSIS — E782 Mixed hyperlipidemia: Secondary | ICD-10-CM

## 2022-09-06 LAB — HEPATIC FUNCTION PANEL
ALT: 19 U/L (ref 0–53)
AST: 21 U/L (ref 0–37)
Albumin: 4.3 g/dL (ref 3.5–5.2)
Alkaline Phosphatase: 32 U/L — ABNORMAL LOW (ref 39–117)
Bilirubin, Direct: 0.2 mg/dL (ref 0.0–0.3)
Total Bilirubin: 0.7 mg/dL (ref 0.2–1.2)
Total Protein: 7.2 g/dL (ref 6.0–8.3)

## 2022-09-06 LAB — BASIC METABOLIC PANEL
BUN: 23 mg/dL (ref 6–23)
CO2: 29 mEq/L (ref 19–32)
Calcium: 9.5 mg/dL (ref 8.4–10.5)
Chloride: 103 mEq/L (ref 96–112)
Creatinine, Ser: 1.15 mg/dL (ref 0.40–1.50)
GFR: 62.95 mL/min (ref >60.00)
Glucose, Bld: 100 mg/dL — ABNORMAL HIGH (ref 70–99)
Potassium: 4.4 mEq/L (ref 3.5–5.1)
Sodium: 140 mEq/L (ref 135–145)

## 2022-09-06 LAB — CBC WITH DIFFERENTIAL/PLATELET
Basophils Absolute: 0.1 10*3/uL (ref 0.0–0.1)
Basophils Relative: 1.1 % (ref 0.0–3.0)
Eosinophils Absolute: 0.9 10*3/uL — ABNORMAL HIGH (ref 0.0–0.7)
Eosinophils Relative: 11.3 % — ABNORMAL HIGH (ref 0.0–5.0)
HCT: 38.5 % — ABNORMAL LOW (ref 39.0–52.0)
Hemoglobin: 13 g/dL (ref 13.0–17.0)
Lymphocytes Relative: 24.6 % (ref 12.0–46.0)
Lymphs Abs: 1.9 10*3/uL (ref 0.7–4.0)
MCHC: 33.7 g/dL (ref 30.0–36.0)
MCV: 88.8 fl (ref 78.0–100.0)
Monocytes Absolute: 0.6 10*3/uL (ref 0.1–1.0)
Monocytes Relative: 7.6 % (ref 3.0–12.0)
Neutro Abs: 4.2 10*3/uL (ref 1.4–7.7)
Neutrophils Relative %: 55.4 % (ref 43.0–77.0)
Platelets: 258 10*3/uL (ref 150.0–400.0)
RBC: 4.33 Mil/uL (ref 4.22–5.81)
RDW: 13.3 % (ref 11.5–15.5)
WBC: 7.5 10*3/uL (ref 4.0–10.5)

## 2022-09-06 LAB — VITAMIN B12: Vitamin B-12: 340 pg/mL (ref 211–911)

## 2022-09-06 LAB — FERRITIN: Ferritin: 125.9 ng/mL (ref 22.0–322.0)

## 2022-09-06 LAB — LIPID PANEL
Cholesterol: 112 mg/dL (ref 0–200)
HDL: 44.1 mg/dL (ref >39.00)
LDL Cholesterol: 55 mg/dL (ref 0–99)
NonHDL: 67.53
Total CHOL/HDL Ratio: 3
Triglycerides: 65 mg/dL (ref 0.0–149.0)
VLDL: 13 mg/dL (ref 0.0–40.0)

## 2022-09-06 LAB — URINALYSIS, ROUTINE W REFLEX MICROSCOPIC
Hgb urine dipstick: NEGATIVE
Ketones, ur: NEGATIVE
Leukocytes,Ua: NEGATIVE
Nitrite: NEGATIVE
RBC / HPF: NONE SEEN (ref 0–?)
Specific Gravity, Urine: 1.025 (ref 1.000–1.030)
Total Protein, Urine: NEGATIVE
Urine Glucose: NEGATIVE
Urobilinogen, UA: 0.2 (ref 0.0–1.0)
WBC, UA: NONE SEEN (ref 0–?)
pH: 6 (ref 5.0–8.0)

## 2022-09-06 LAB — VITAMIN D 25 HYDROXY (VIT D DEFICIENCY, FRACTURES): VITD: 47.59 ng/mL (ref 30.00–100.00)

## 2022-09-06 LAB — IBC PANEL
Iron: 150 ug/dL (ref 42–165)
Saturation Ratios: 50.5 % — ABNORMAL HIGH (ref 20.0–50.0)
TIBC: 296.8 ug/dL (ref 250.0–450.0)
Transferrin: 212 mg/dL (ref 212.0–360.0)

## 2022-09-06 LAB — HEMOGLOBIN A1C: Hgb A1c MFr Bld: 6.2 % (ref 4.6–6.5)

## 2022-09-06 LAB — TSH: TSH: 0.87 u[IU]/mL (ref 0.35–5.50)

## 2022-09-06 NOTE — Telephone Encounter (Signed)
Orders placed.

## 2022-09-06 NOTE — Patient Instructions (Addendum)
Ok to stop the iron for now  Please continue all other medications as before, and refills have been done if requested.  Please have the pharmacy call with any other refills you may need.  Please continue your efforts at being more active, low cholesterol diet, and weight control.  You are otherwise up to date with prevention measures today.  Please keep your appointments with your specialists as you may have planned - GI for endo later this month  Please go to the LAB at the blood drawing area for the tests to be done  You will be contacted by phone if any changes need to be made immediately.  Otherwise, you will receive a letter about your results with an explanation, but please check with MyChart first.  Please remember to sign up for MyChart if you have not done so, as this will be important to you in the future with finding out test results, communicating by private email, and scheduling acute appointments online when needed.  Please make an Appointment to return in 6 months, or sooner if needed

## 2022-09-06 NOTE — Assessment & Plan Note (Signed)
Stable on once daily nexium

## 2022-09-06 NOTE — Progress Notes (Signed)
Patient ID: [name], male   DOB: Apr 01, 1946, 76 y.o.   MRN: [id number]         Chief Complaint:: wellness exam and iron deficiency anemia, chronic abd pain, low vit d, hld, hyperglycemia, gerd, htn, ckd3a       HPI:  Oscar Dixon is a 76 y.o. male here for wellness exam; declines covid booster, o/w up to date                        Also has f/u EGD for late Nov 2023 with GI.  Pt denies chest pain, increased sob or doe, wheezing, orthopnea, PND, increased LE swelling, palpitations, dizziness or syncope.   Pt denies polydipsia, polyuria, or new focal neuro s/s.    Pt denies fever, wt loss, night sweats, loss of appetite, or other constitutional symptoms   Lost several lbs recently with less calories. No overt bleeding, taking oral iron, has ongoing chronic persistent mild lower abd fullness but not really a pain,  Denies urinary symptoms such as dysuria, frequency, urgency, flank pain, hematuria or n/v, fever, chills.   Wt Readings from Last 3 Encounters:  09/06/22 191 lb (86.6 kg)  07/31/22 194 lb 9.6 oz (88.3 kg)  05/02/22 195 lb 6.4 oz (88.6 kg)   BP Readings from Last 3 Encounters:  09/06/22 114/66  05/02/22 136/85  04/25/22 133/78   Immunization History  Administered Date(s) Administered   Influenza Split 09/18/2012, 07/30/2022   Influenza Whole 06/30/2010   Influenza, High Dose Seasonal PF 07/26/2014, 07/19/2015, 07/20/2015, 06/15/2019   Influenza,inj,Quad PF,6+ Mos 09/18/2013   Influenza-Unspecified 07/23/2017, 08/05/2018, 07/11/2021   PFIZER(Purple Top)SARS-COV-2 Vaccination 12/11/2019, 01/05/2020, 08/04/2020   Pfizer Covid-19 Vaccine Bivalent Booster 89yr & up 08/01/2021   Pneumococcal Conjugate-13 10/27/2013   Pneumococcal Polysaccharide-23 09/22/2014   Td 05/06/2006   Tdap 10/02/2016   Zoster Recombinat (Shingrix) 08/13/2017, 12/26/2017, 02/19/2022   Zoster, Live 06/24/2009   Health Maintenance Due  Topic Date Due   Medicare Annual Wellness (AWV)  Never  done      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   Cataract    bilateral sx   DYSPNEA ON EXERTION 12/15/2009   Erectile dysfunction 04/12/2012   FATIGUE 03/27/2010   GERD 05/20/2007   on meds   GOUT 03/27/2010   HAND PAIN 12/15/2009   Headache(784.0) 140/06/6760  HERNIA, UMBILICAL 095/07/3266  HYPERLIPIDEMIA 07/09/2008   on meds   HYPERTENSION 05/20/2007   on meds   LIBIDO, DECREASED 03/27/2010   Mitral regurgitation 09/12/2011   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 He has never used smokeless tobacco. He reports current alcohol use of about 7.0 standard drinks of alcohol per week. He reports that he does not use drugs. family history includes Alcohol abuse in his mother; Hypertension in his father and mother; Lung cancer (age of onset: 650 in his brother; Stroke (age of onset: 869 in his father. Allergies  Allergen Reactions   Augmentin [Amoxicillin-Pot Clavulanate] Diarrhea    diarrhea   Current Outpatient Medications on File Prior to Visit  Medication Sig Dispense Refill   amLODipine (NORVASC) 10 MG tablet Take 1 tablet (10 mg total) by mouth daily. 90 tablet 1   aspirin 81 MG EC tablet Take 81 mg by mouth daily.     benazepril (LOTENSIN) 40 MG tablet TAKE 1 TABLET(40 MG) BY MOUTH DAILY 90 tablet 3   Cholecalciferol (VITAMIN D3 PO) Take 2,000 Units by mouth daily.     esomeprazole (NEXIUM) 40 MG capsule TAKE 1 CAPSULE BY MOUTH EVERY DAY AT NOON 90 capsule 2   esomeprazole (NEXIUM) 40 MG packet Take 40 mg by mouth in the morning and at bedtime. 60 each 11    iron polysaccharides (NIFEREX) 150 MG capsule TAKE 1 CAPSULE(150 MG) BY MOUTH DAILY 90 capsule 1   rosuvastatin (CRESTOR) 20 MG tablet TAKE 1 TABLET(20 MG) BY MOUTH DAILY 90 tablet 3   zolpidem (AMBIEN) 5 MG tablet Take 1 tablet (5 mg total) by mouth at bedtime as needed. for sleep 90 tablet 1   albuterol (PROAIR HFA) 108 (90 Base) MCG/ACT inhaler Inhale 2 puffs into the lungs every 6 (six) hours as needed for wheezing or shortness of breath. (Patient not taking: Reported on 07/31/2022) 8 g 5   No current facility-administered medications on file prior to visit.        ROS:  All others reviewed and negative.  Objective        PE:  BP 114/66 (BP Location: Right Arm, Patient Position: Sitting, Cuff Size: Large)   Pulse 65   Temp 97.8 F (36.6 C) (Oral)   Ht '6\' 2"'$  (1.88 m)   Wt 191 lb (86.6 kg)   SpO2 97%   BMI 24.52 kg/m                 Constitutional: Pt appears in NAD               HENT: Head: NCAT.                Right Ear: External ear normal.                 Left Ear: External ear normal.                Eyes: . Pupils are equal, round, and reactive to light. Conjunctivae and EOM are normal               Nose: without d/c or deformity               Neck: Neck supple. Gross normal ROM               Cardiovascular: Normal rate and regular rhythm.                 Pulmonary/Chest: Effort normal and breath sounds without rales or wheezing.                Abd:  Soft, NT, ND, + BS, no organomegaly               Neurological: Pt is alert. At baseline orientation, motor grossly intact               Skin: Skin is warm. No rashes, no other new lesions, LE edema - none               Psychiatric: Pt behavior is  normal without agitation   Micro: none  Cardiac tracings I have personally interpreted today:  none  Pertinent Radiological findings (summarize): none   Lab Results  Component Value Date   WBC 7.5 09/06/2022   HGB 13.0 09/06/2022   HCT 38.5 (L) 09/06/2022   PLT 258.0  09/06/2022   GLUCOSE 100 (H) 09/06/2022   CHOL 112 09/06/2022   TRIG 65.0 09/06/2022   HDL 44.10 09/06/2022   LDLCALC 55 09/06/2022   ALT 19 09/06/2022   AST 21 09/06/2022   NA 140 09/06/2022   K 4.4 09/06/2022   CL 103 09/06/2022   CREATININE 1.15 09/06/2022   BUN 23 09/06/2022   CO2 29 09/06/2022   TSH 0.87 09/06/2022   PSA 3.70 02/26/2022   HGBA1C 6.2 09/06/2022   Assessment/Plan:  ALVA BROXSON is a 76 y.o. White or Caucasian [1] male with  has a past medical history of ADD (07/09/2008), Allergy, Arthritis, CARPAL TUNNEL SYNDROME, BILATERAL (12/15/2009), Cataract, DYSPNEA ON EXERTION (12/15/2009), Erectile dysfunction (04/12/2012), FATIGUE (03/27/2010), GERD (05/20/2007), GOUT (03/27/2010), HAND PAIN (12/15/2009), Headache(784.0) (26/33/3545), HERNIA, UMBILICAL (62/56/3893), HYPERLIPIDEMIA (07/09/2008), HYPERTENSION (05/20/2007), LIBIDO, DECREASED (03/27/2010), Mitral regurgitation (09/12/2011), and Other specified forms of hearing loss (06/30/2010).  GERD Stable on once daily nexium  Encounter for well adult exam with abnormal findings Age and sex appropriate education and counseling updated with regular exercise and diet Referrals for preventative services - none needed Immunizations addressed - declines covid booster Smoking counseling  - none needed Evidence for depression or other mood disorder - none significant Most recent labs reviewed. I have personally reviewed and have noted: 1) the patient's medical and social history 2) The patient's current medications and supplements 3) The patient's height, weight, and BMI have been recorded in the chart   Vitamin D deficiency Last vitamin D Lab Results  Component Value Date   VD25OH 47.59 09/06/2022   Stable, cont oral replacement   Hyperlipidemia Lab Results  Component Value Date   LDLCALC 55 09/06/2022   Stable, pt to continue current statin crestor 20 mg  qd   Hyperglycemia Lab Results  Component  Value Date   HGBA1C 6.2 09/06/2022   Stable, pt to continue current medical treatment - diet wt control, excercise    Essential hypertension BP Readings from Last 3 Encounters:  09/06/22 114/66  05/02/22 136/85  04/25/22 133/78   Stable, pt to continue medical treatment norvasc 10 mg qd, lotensin 40 mg qd   CKD (chronic kidney disease) stage 3, GFR 30-59 ml/min (HCC) Lab Results  Component Value Date   CREATININE 1.15 09/06/2022   Stable overall, cont to avoid nephrotoxins   Abdominal pain Exam benign, etiology unclear, hopefuly to stop with d/c iron, for iron labs, and consider CT for persistent or worsening  Iron deficiency anemia Also for f/u iron lab today  Followup: Return in about 6 months (around 03/07/2023).  Cathlean Cower, MD 09/08/2022 9:03 PM [facility]

## 2022-09-06 NOTE — Telephone Encounter (Signed)
Patient seen on 11/2

## 2022-09-08 ENCOUNTER — Encounter: Payer: Self-pay | Admitting: Internal Medicine

## 2022-09-08 NOTE — Assessment & Plan Note (Signed)
Last vitamin D Lab Results  Component Value Date   VD25OH 47.59 09/06/2022   Stable, cont oral replacement

## 2022-09-08 NOTE — Assessment & Plan Note (Signed)

## 2022-09-08 NOTE — Assessment & Plan Note (Signed)
Lab Results  Component Value Date   LDLCALC 55 09/06/2022   Stable, pt to continue current statin crestor 20 mg  qd

## 2022-09-08 NOTE — Assessment & Plan Note (Signed)
Exam benign, etiology unclear, hopefuly to stop with d/c iron, for iron labs, and consider CT for persistent or worsening

## 2022-09-08 NOTE — Assessment & Plan Note (Signed)
Also for f/u iron lab today

## 2022-09-08 NOTE — Assessment & Plan Note (Signed)
Lab Results  Component Value Date   HGBA1C 6.2 09/06/2022   Stable, pt to continue current medical treatment - diet wt control, excercise

## 2022-09-08 NOTE — Assessment & Plan Note (Signed)
Lab Results  Component Value Date   CREATININE 1.15 09/06/2022   Stable overall, cont to avoid nephrotoxins

## 2022-09-08 NOTE — Assessment & Plan Note (Signed)
BP Readings from Last 3 Encounters:  09/06/22 114/66  05/02/22 136/85  04/25/22 133/78   Stable, pt to continue medical treatment norvasc 10 mg qd, lotensin 40 mg qd

## 2022-09-10 ENCOUNTER — Ambulatory Visit (INDEPENDENT_AMBULATORY_CARE_PROVIDER_SITE_OTHER)
Admission: RE | Admit: 2022-09-10 | Discharge: 2022-09-10 | Disposition: A | Discharge status: Home / Self Care | Payer: Medicare Other | Source: Ambulatory Visit | Attending: Gastroenterology | Admitting: Gastroenterology

## 2022-09-10 DIAGNOSIS — R1030 Lower abdominal pain, unspecified: Secondary | ICD-10-CM

## 2022-09-24 ENCOUNTER — Other Ambulatory Visit: Payer: Self-pay | Admitting: Internal Medicine

## 2022-09-24 NOTE — Telephone Encounter (Signed)
Please refill as per office routine med refill policy (all routine meds to be refilled for 3 mo or monthly (per pt preference) up to one year from last visit, then month to month grace period for 3 mo, then further med refills will have to be denied) ? ?

## 2022-09-27 ENCOUNTER — Other Ambulatory Visit: Payer: Self-pay | Admitting: Internal Medicine

## 2022-09-29 ENCOUNTER — Encounter: Payer: Self-pay | Admitting: Certified Registered Nurse Anesthetist

## 2022-10-02 ENCOUNTER — Ambulatory Visit (AMBULATORY_SURGERY_CENTER): Payer: Medicare Other | Admitting: Gastroenterology

## 2022-10-02 ENCOUNTER — Encounter: Payer: Self-pay | Admitting: Gastroenterology

## 2022-10-02 VITALS — BP 119/67 | HR 53 | Temp 96.9°F | Resp 11 | Ht 74.0 in | Wt 194.6 lb

## 2022-10-02 DIAGNOSIS — R12 Heartburn: Secondary | ICD-10-CM | POA: Diagnosis not present

## 2022-10-02 DIAGNOSIS — K259 Gastric ulcer, unspecified as acute or chronic, without hemorrhage or perforation: Secondary | ICD-10-CM

## 2022-10-02 DIAGNOSIS — K449 Diaphragmatic hernia without obstruction or gangrene: Secondary | ICD-10-CM | POA: Diagnosis not present

## 2022-10-02 MED ORDER — SODIUM CHLORIDE 0.9 % IV SOLN: 500.0000 mL | INTRAVENOUS | Status: DC

## 2022-10-02 MED ORDER — ESOMEPRAZOLE MAGNESIUM 40 MG PO CPDR: 40.0000 mg | DELAYED_RELEASE_CAPSULE | Freq: Two times a day (BID) | ORAL | 12 refills | Status: DC

## 2022-10-02 NOTE — Progress Notes (Signed)
Pt's states no medical or surgical changes since previsit or office visit. Record updates made.

## 2022-10-02 NOTE — Op Note (Signed)
[facility] Patient Name: Oscar Dixon Procedure Date: 10/02/2022 9:48 AM MRN: [id number] Endoscopist: Justice Britain , MD, [id number] Age: 76 Referring MD:  Date of Birth: 01/28/1946 Gender: Male Account #: [id number] Procedure:                Upper GI endoscopy Indications:              Heartburn, Suspected upper gastrointestinal                            bleeding in patient with chronic blood loss,                            Follow-up of chronic gastric ulcer Medicines:                Monitored Anesthesia Care Procedure:                Pre-Anesthesia Assessment:                           - Prior to the procedure, a History and Physical                            was performed, and patient medications and                            allergies were reviewed. The patient's tolerance of                            previous anesthesia was also reviewed. The risks                            and benefits of the procedure and the sedation                            options and risks were discussed with the patient.                            All questions were answered, and informed consent                            was obtained. Prior Anticoagulants: The patient has                            taken no anticoagulant or antiplatelet agents                            except for aspirin. ASA Grade Assessment: II - A                            patient with mild systemic disease. After reviewing                            the risks and benefits, the patient was deemed in  satisfactory condition to undergo the procedure.                           After obtaining informed consent, the endoscope was                            passed under direct vision. Throughout the                            procedure, the patient's blood pressure, pulse, and                            oxygen saturations were monitored continuously. The                            GIF  [id number] #[id number] was introduced through the                            mouth, and advanced to the second part of duodenum.                            The upper GI endoscopy was accomplished without                            difficulty. The patient tolerated the procedure. Scope In: Scope Out: Findings:                 No gross lesions were noted in the entire esophagus.                           The Z-line was irregular and was found 39 cm from                            the incisors.                           A 9 cm hiatal hernia was present.                           Multiple dispersed small erosions with no bleeding                            and no stigmata of recent bleeding were found in                            the cardia of the hiatal hernia (Cameron's                            erosions/Cameron's ulcerations).                           No other gross lesions were noted in the entire                            examined  stomach.                           A tattoo was seen in the duodenal sweep.                           No other gross lesions were noted in the duodenal                            bulb, in the first portion of the duodenum and in                            the second portion of the duodenum. Complications:            No immediate complications. Estimated Blood Loss:     Estimated blood loss was minimal. Impression:               - No gross lesions in the entire esophagus. Z-line                            irregular, 39 cm from the incisors.                           - 9 cm hiatal hernia.                           - Cameron's erosions/Cameron's ulcerations noted                            within the cardia of the stomach within hiatal                            hernia. No other gross lesions in the entire                            stomach.                           - A tattoo was seen in the duodenal sweep. No other                            gross lesions in the  duodenal bulb, in the first                            portion of the duodenum and in the second portion                            of the duodenum. Recommendation:           - The patient will be observed post-procedure,                            until all discharge criteria are met.                           -  Discharge patient to home.                           - Patient has a contact number available for                            emergencies. The signs and symptoms of potential                            delayed complications were discussed with the                            patient. Return to normal activities tomorrow.                            Written discharge instructions were provided to the                            patient.                           - Resume previous diet.                           - Continue present medications.                           - Patient could benefit from continuing twice daily                            PPI, but may be able to just maintain where he is                            at currently since he has no evidence of anemia or                            iron deficiency currently and this maintains his                            GERD symptoms. We will discuss this further.                           - Patient may tolerate IV iron infusions better                            than oral iron. We will need to consider this in                            the future should recurrent iron deficiency develop.                           - Should continued evaluation for iron deficiency  be needed, then query endoscopic placement of a                            video capsule endoscopy due to the very large                            hiatal hernia, if high-dose PPI therapy does not                            improve recurrent issues.                           - Patient otherwise controlled in regards to his                             GERD symptoms, so I am not sure that surgical                            management of his hiatal hernia is required but                            certainly can be considered at some point if                            necessary.                           - The findings and recommendations were discussed                            with the patient.                           - The findings and recommendations were discussed                            with the patient's family. Justice Britain, MD 10/02/2022 10:16:31 AM

## 2022-10-02 NOTE — Progress Notes (Signed)
GASTROENTEROLOGY PROCEDURE H&P NOTE   Primary Care Physician: Biagio Borg, MD  HPI: Oscar Dixon is a 76 y.o. male who presents for EGD for follow-up of Cameron's ulcers and previous iron deficiency anemia.  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   Cataract    bilateral sx   DYSPNEA ON EXERTION 12/15/2009   Erectile dysfunction 04/12/2012   FATIGUE 03/27/2010   GERD 05/20/2007   on meds   GOUT 03/27/2010   HAND PAIN 12/15/2009   Headache(784.0) 59/93/5701   HERNIA, UMBILICAL 77/93/9030   HYPERLIPIDEMIA 07/09/2008   on meds   HYPERTENSION 05/20/2007   on meds   LIBIDO, DECREASED 03/27/2010   Mitral regurgitation 09/12/2011   Other specified forms of hearing loss 06/30/2010   Past Surgical History:  Procedure Laterality Date   ANKLE SURGERY Left 1967   CATARACT EXTRACTION Bilateral    COLONOSCOPY  2016   TA's -suprep (exc)   ELBOW SURGERY Right 1970   POLYPECTOMY  2016   TA's    s/p basal cell cancer     UMBILICAL HERNIA REPAIR     UPPER GASTROINTESTINAL ENDOSCOPY     Current Outpatient Medications  Medication Sig Dispense Refill   amLODipine (NORVASC) 10 MG tablet Take 1 tablet (10 mg total) by mouth daily. 90 tablet 1   aspirin 81 MG EC tablet Take 81 mg by mouth daily.     benazepril (LOTENSIN) 40 MG tablet TAKE 1 TABLET(40 MG) BY MOUTH DAILY 90 tablet 3   Cholecalciferol (VITAMIN D3 PO) Take 2,000 Units by mouth daily.     esomeprazole (NEXIUM) 40 MG capsule TAKE 1 CAPSULE BY MOUTH EVERY DAY AT NOON 90 capsule 2   esomeprazole (NEXIUM) 40 MG packet Take 40 mg by mouth in the morning and at bedtime. 60 each 11   rosuvastatin (CRESTOR) 20 MG tablet TAKE 1 TABLET(20 MG) BY MOUTH DAILY 90 tablet 3   zolpidem (AMBIEN) 5 MG tablet TAKE 1 TABLET(5 MG) BY MOUTH AT BEDTIME AS NEEDED FOR SLEEP 90 tablet 1   albuterol (PROAIR HFA) 108 (90 Base) MCG/ACT inhaler  Inhale 2 puffs into the lungs every 6 (six) hours as needed for wheezing or shortness of breath. (Patient not taking: Reported on 07/31/2022) 8 g 5   iron polysaccharides (NIFEREX) 150 MG capsule TAKE 1 CAPSULE(150 MG) BY MOUTH DAILY (Patient not taking: Reported on 10/02/2022) 90 capsule 1   No current facility-administered medications for this visit.    Current Outpatient Medications:    amLODipine (NORVASC) 10 MG tablet, Take 1 tablet (10 mg total) by mouth daily., Disp: 90 tablet, Rfl: 1   aspirin 81 MG EC tablet, Take 81 mg by mouth daily., Disp: , Rfl:    benazepril (LOTENSIN) 40 MG tablet, TAKE 1 TABLET(40 MG) BY MOUTH DAILY, Disp: 90 tablet, Rfl: 3   Cholecalciferol (VITAMIN D3 PO), Take 2,000 Units by mouth daily., Disp: , Rfl:    esomeprazole (NEXIUM) 40 MG capsule, TAKE 1 CAPSULE BY MOUTH EVERY DAY AT NOON, Disp: 90 capsule, Rfl: 2   esomeprazole (NEXIUM) 40 MG packet, Take 40 mg by mouth in the morning and at bedtime., Disp: 60 each, Rfl: 11   rosuvastatin (CRESTOR) 20 MG tablet, TAKE 1 TABLET(20 MG) BY MOUTH DAILY, Disp: 90 tablet, Rfl: 3   zolpidem (AMBIEN) 5 MG tablet, TAKE 1 TABLET(5 MG) BY  MOUTH AT BEDTIME AS NEEDED FOR SLEEP, Disp: 90 tablet, Rfl: 1   albuterol (PROAIR HFA) 108 (90 Base) MCG/ACT inhaler, Inhale 2 puffs into the lungs every 6 (six) hours as needed for wheezing or shortness of breath. (Patient not taking: Reported on 07/31/2022), Disp: 8 g, Rfl: 5   iron polysaccharides (NIFEREX) 150 MG capsule, TAKE 1 CAPSULE(150 MG) BY MOUTH DAILY (Patient not taking: Reported on 10/02/2022), Disp: 90 capsule, Rfl: 1 Allergies  Allergen Reactions   Augmentin [Amoxicillin-Pot Clavulanate] Diarrhea    diarrhea   Family History  Problem Relation Age of Onset   Hypertension Mother    Alcohol abuse Mother        ETOH   Hypertension Father    Stroke Father 88   Lung cancer Brother 76   Colon polyps Neg Hx    Colon cancer Neg Hx    Esophageal cancer Neg Hx    Stomach cancer  Neg Hx    Rectal cancer Neg Hx    Inflammatory bowel disease Neg Hx    Liver disease Neg Hx    Pancreatic cancer Neg Hx    Social History   Socioeconomic History   Marital status: Married    Spouse name: Not on file   Number of children: 3   Years of education: college   Highest education level: Not on file  Occupational History   Occupation: Museum/gallery curator   Tobacco Use   Smoking status: Former    Years: 10.00    Types: Cigarettes    Quit date: 1988    Years since quitting: 35.9   Smokeless tobacco: Never  Vaping Use   Vaping Use: Never used  Substance and Sexual Activity   Alcohol use: Yes    Alcohol/week: 7.0 standard drinks of alcohol    Types: 7 Standard drinks or equivalent per week   Drug use: Never   Sexual activity: Yes  Other Topics Concern   Not on file  Social History Narrative   Lives w/ wife   Caffeine use: 2 cups coffee every am   Right handed    Social Determinants of Health   Financial Resource Strain: Not on file  Food Insecurity: Not on file  Transportation Needs: Not on file  Physical Activity: Not on file  Stress: Not on file  Social Connections: Not on file  Intimate Partner Violence: Not on file    Physical Exam: Today's Vitals   10/02/22 0906  BP: 114/62  Pulse: 66  Temp: (!) 96.9 F (36.1 C)  TempSrc: Temporal  SpO2: 97%  Weight: 194 lb 9.6 oz (88.3 kg)  Height: '6\' 2"'$  (1.88 m)   Body mass index is 24.99 kg/m. GEN: NAD EYE: Sclerae anicteric ENT: MMM CV: Non-tachycardic GI: Soft, NT/ND NEURO:  Alert & Oriented x 3  Lab Results: No results for input(s): "WBC", "HGB", "HCT", "PLT" in the last 72 hours. BMET No results for input(s): "NA", "K", "CL", "CO2", "GLUCOSE", "BUN", "CREATININE", "CALCIUM" in the last 72 hours. LFT No results for input(s): "PROT", "ALBUMIN", "AST", "ALT", "ALKPHOS", "BILITOT", "BILIDIR", "IBILI" in the last 72 hours. PT/INR No results for input(s): "LABPROT", "INR" in the last 72  hours.   Impression / Plan: This is a 76 y.o.male who presents for EGD for follow-up of Cameron's ulcers and previous iron deficiency anemia.  The risks and benefits of endoscopic evaluation/treatment were discussed with the patient and/or family; these include but are not limited to the risk of perforation, infection, bleeding, missed lesions,  lack of diagnosis, severe illness requiring hospitalization, as well as anesthesia and sedation related illnesses.  The patient's history has been reviewed, patient examined, no change in status, and deemed stable for procedure.  The patient and/or family is agreeable to proceed.    Justice Britain, MD [facility] Office # [phone]

## 2022-10-02 NOTE — Progress Notes (Signed)
Report given to PACU, vss 

## 2022-10-02 NOTE — Patient Instructions (Addendum)
- The patient will be observed post-procedure, until all discharge criteria are met. - Discharge patient to home. - Patient has a contact number available for emergencies potential delayed complications were discussed with the patient. Return to normal activities tomorrow. Written discharge instructions were provided to the patient. - Resume previous diet. - Continue present medications. - Patient could benefit from continuing twice daily PPI, but may be able to just maintain where he is at currently since he has no evidence of anemia or iron deficiency currently and this maintains his GERD symptoms. We will discuss this further. - Patient may tolerate IV iron infusions better than oral iron. We will need to consider this in the future should recurrent iron deficiency develop. - Should continued evaluation for iron deficiency be needed, then query endoscopic placement of a video capsule endoscopy due to the very large hiatal hernia, if high-dose PPI therapy does not improve recurrent issues. - Patient otherwise controlled in regards to his GERD symptoms, so I am not sure that surgical management of his hiatal hernia is required but certainly can be considered at some point if necessary. - The findings and recommendations were discussed with the patient. - The findings and recommendations were discussed with the patient's family.  YOU HAD AN ENDOSCOPIC PROCEDURE TODAY AT [facility]:   Refer to the procedure report that was given to you for any specific questions about what was found during the examination.  If the procedure report does not answer your questions, please call your gastroenterologist to clarify.  If you requested that your care partner not be given the details of your procedure findings, then the procedure report has been included in a sealed envelope for you to review at your convenience later.  YOU SHOULD EXPECT: Some feelings of bloating in the abdomen.  Passage of more gas than usual.  Walking can help get rid of the air that was put into your GI tract during the procedure and reduce the bloating. If you had a lower endoscopy (such as a colonoscopy or flexible sigmoidoscopy) you may notice spotting of blood in your stool or on the toilet paper. If you underwent a bowel prep for your procedure, you may not have a normal bowel movement for a few days.  Please Note:  You might notice some irritation and congestion in your nose or some drainage.  This is from the oxygen used during your procedure.  There is no need for concern and it should clear up in a day or so.  SYMPTOMS TO REPORT IMMEDIATELY:  Following upper endoscopy (EGD)  Vomiting of blood or coffee ground material  New chest pain or pain under the shoulder blades  Painful or persistently difficult swallowing  New shortness of breath  Fever of 100F or higher  Black, tarry-looking stools  For urgent or emergent issues, a gastroenterologist can be reached at any hour by calling [phone]. Do not use MyChart messaging for urgent concerns.    DIET:  We do recommend a small meal at first, but then you may proceed to your regular diet.  Drink plenty of fluids but you should avoid alcoholic beverages for 24 hours.  ACTIVITY:  You should plan to take it easy for the rest of today and you should NOT DRIVE or use heavy machinery until tomorrow (because of the sedation medicines used during the test).    FOLLOW UP: Our staff will call the number listed on your records the next business day following your  procedure.  We will call around 7:15- 8:00 am to check on you and address any questions or concerns that you may have regarding the information given to you following your procedure. If we do not reach you, we will leave a message.     If any biopsies were taken you will be contacted by phone or by letter within the next 1-3 weeks.  Please call us at [phone] if you have not heard  about the biopsies in 3 weeks.    SIGNATURES/CONFIDENTIALITY: You and/or your care partner have signed paperwork which will be entered into your electronic medical record.  These signatures attest to the fact that that the information above on your After Visit Summary has been reviewed and is understood.  Full responsibility of the confidentiality of this discharge information lies with you and/or your care-partner.

## 2022-10-02 NOTE — Progress Notes (Signed)
0957 Robinul 0.1 mg IV given due large amount of secretions upon assessment.  MD made aware, vss

## 2022-10-03 ENCOUNTER — Telehealth: Payer: Self-pay

## 2022-10-03 NOTE — Telephone Encounter (Signed)
  Follow up Call-     10/02/2022    9:06 AM 04/20/2022    2:05 PM 03/28/2021   12:56 PM  Call back number  Post procedure Call Back phone  # [phone]- okay to talk to wife Jackelyn Poling [phone]  Permission to leave phone message Yes Yes Yes     Patient questions:  Do you have a fever, pain , or abdominal swelling? No. Pain Score  0 *  Have you tolerated food without any problems? Yes.    Have you been able to return to your normal activities? Yes.    Do you have any questions about your discharge instructions: Diet   No. Medications  No. Follow up visit  No.  Do you have questions or concerns about your Care? No.  Actions: * If pain score is 4 or above: No action needed, pain <4.

## 2022-10-10 ENCOUNTER — Encounter: Payer: Self-pay | Admitting: Gastroenterology

## 2022-11-16 ENCOUNTER — Other Ambulatory Visit: Payer: Self-pay

## 2022-11-16 MED ORDER — BENAZEPRIL HCL 40 MG PO TABS: 40.0000 mg | ORAL_TABLET | Freq: Every day | ORAL | 3 refills | Status: DC

## 2022-11-29 ENCOUNTER — Other Ambulatory Visit: Payer: Self-pay | Admitting: Internal Medicine

## 2022-11-29 NOTE — Telephone Encounter (Signed)
Please refill as per office routine med refill policy (all routine meds to be refilled for 3 mo or monthly (per pt preference) up to one year from last visit, then month to month grace period for 3 mo, then further med refills will have to be denied)

## 2022-12-20 ENCOUNTER — Other Ambulatory Visit: Payer: Self-pay | Admitting: Internal Medicine

## 2022-12-20 NOTE — Telephone Encounter (Signed)
Please refill as per office routine med refill policy (all routine meds to be refilled for 3 mo or monthly (per pt preference) up to one year from last visit, then month to month grace period for 3 mo, then further med refills will have to be denied) ? ?

## 2023-01-10 IMAGING — DX DG ABDOMEN ACUTE W/ 1V CHEST
3 series · 3 of 3 positions shown · non-contrast
Comparison: 11/14/2020 chest radiograph

CLINICAL DATA: Ten days of mid abdominal pain and constipation.

EXAM:
DG ABDOMEN ACUTE WITH 1 VIEW CHEST

[chest pa]
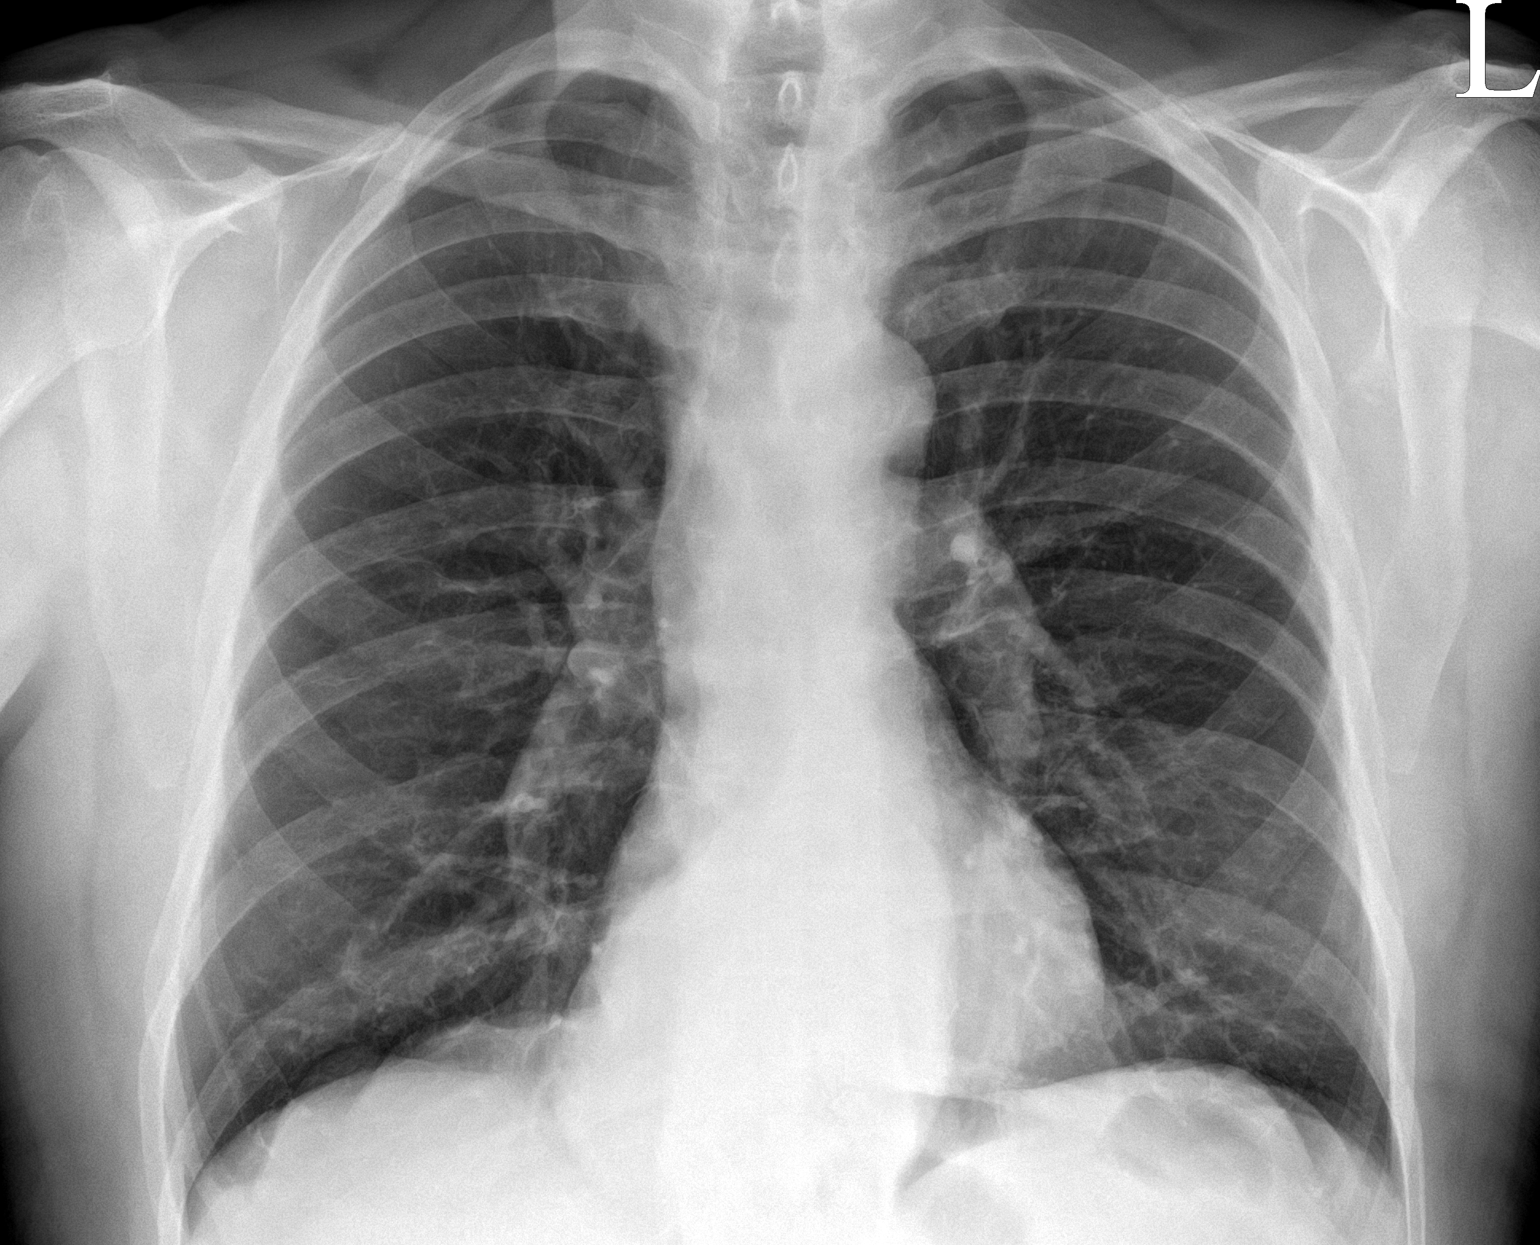

[abdomen erect]
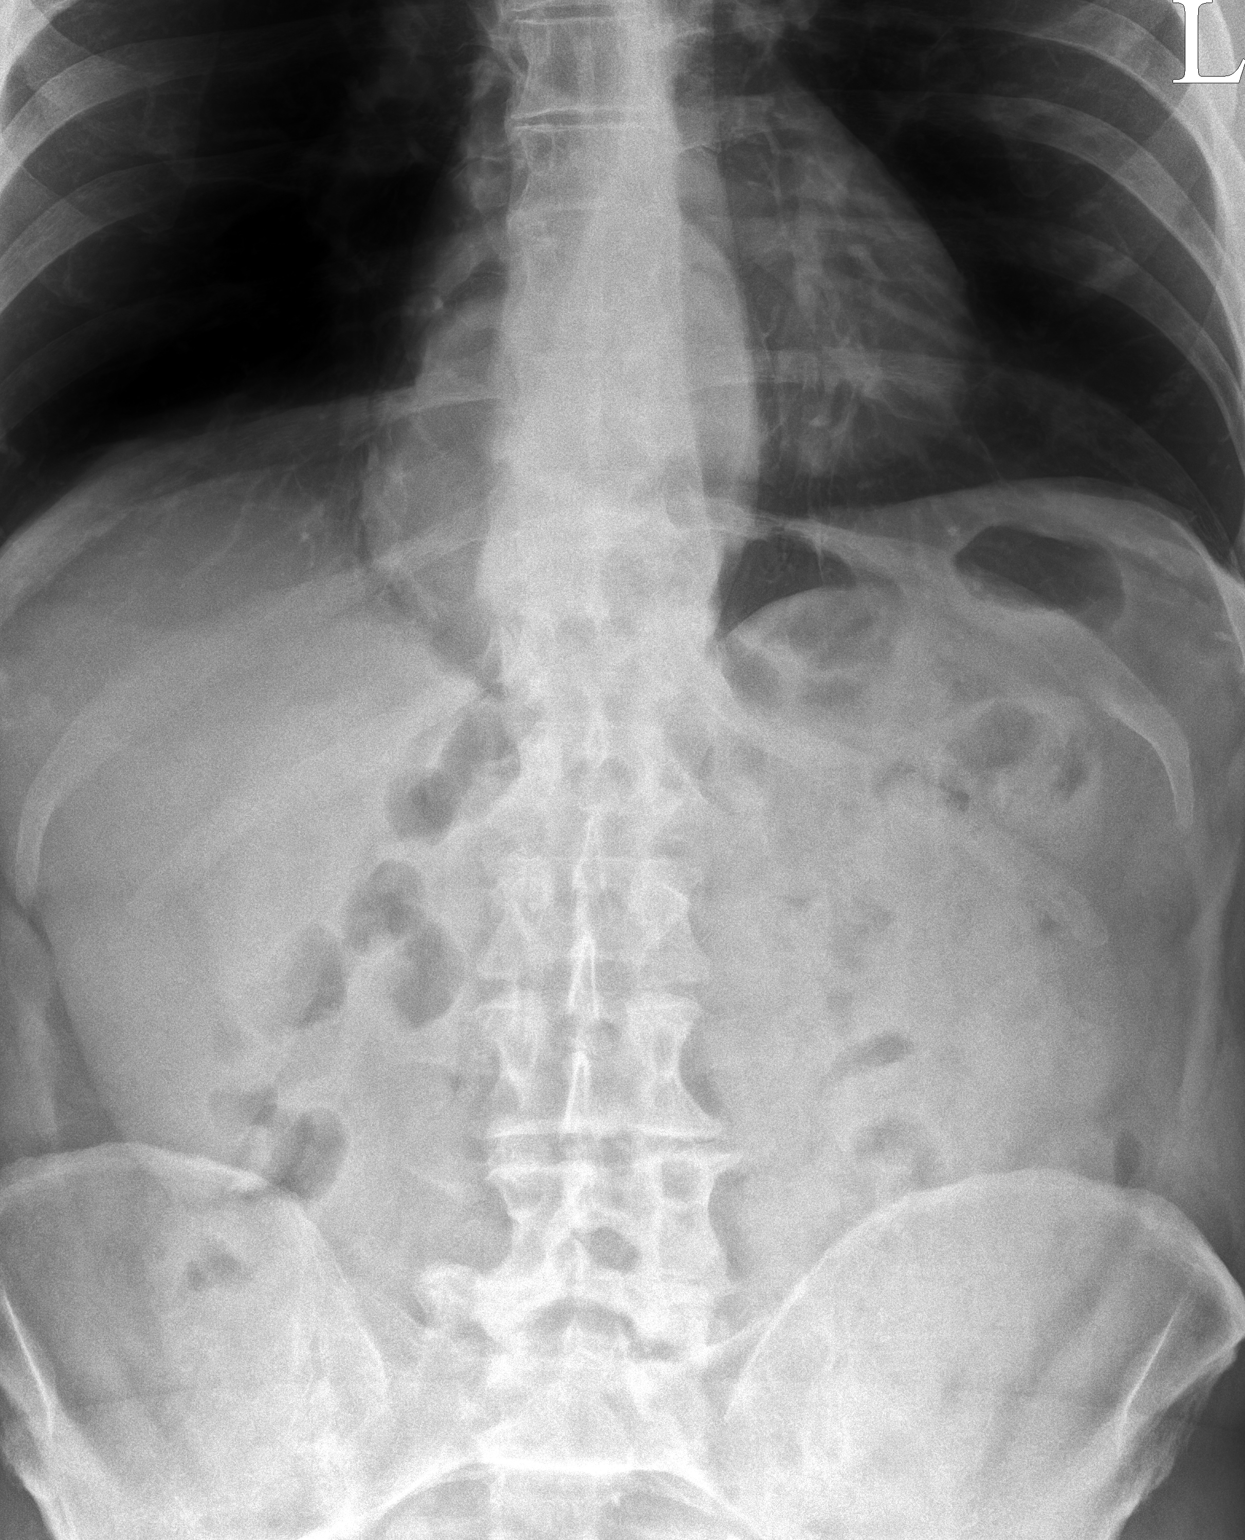

[abdomen supine]
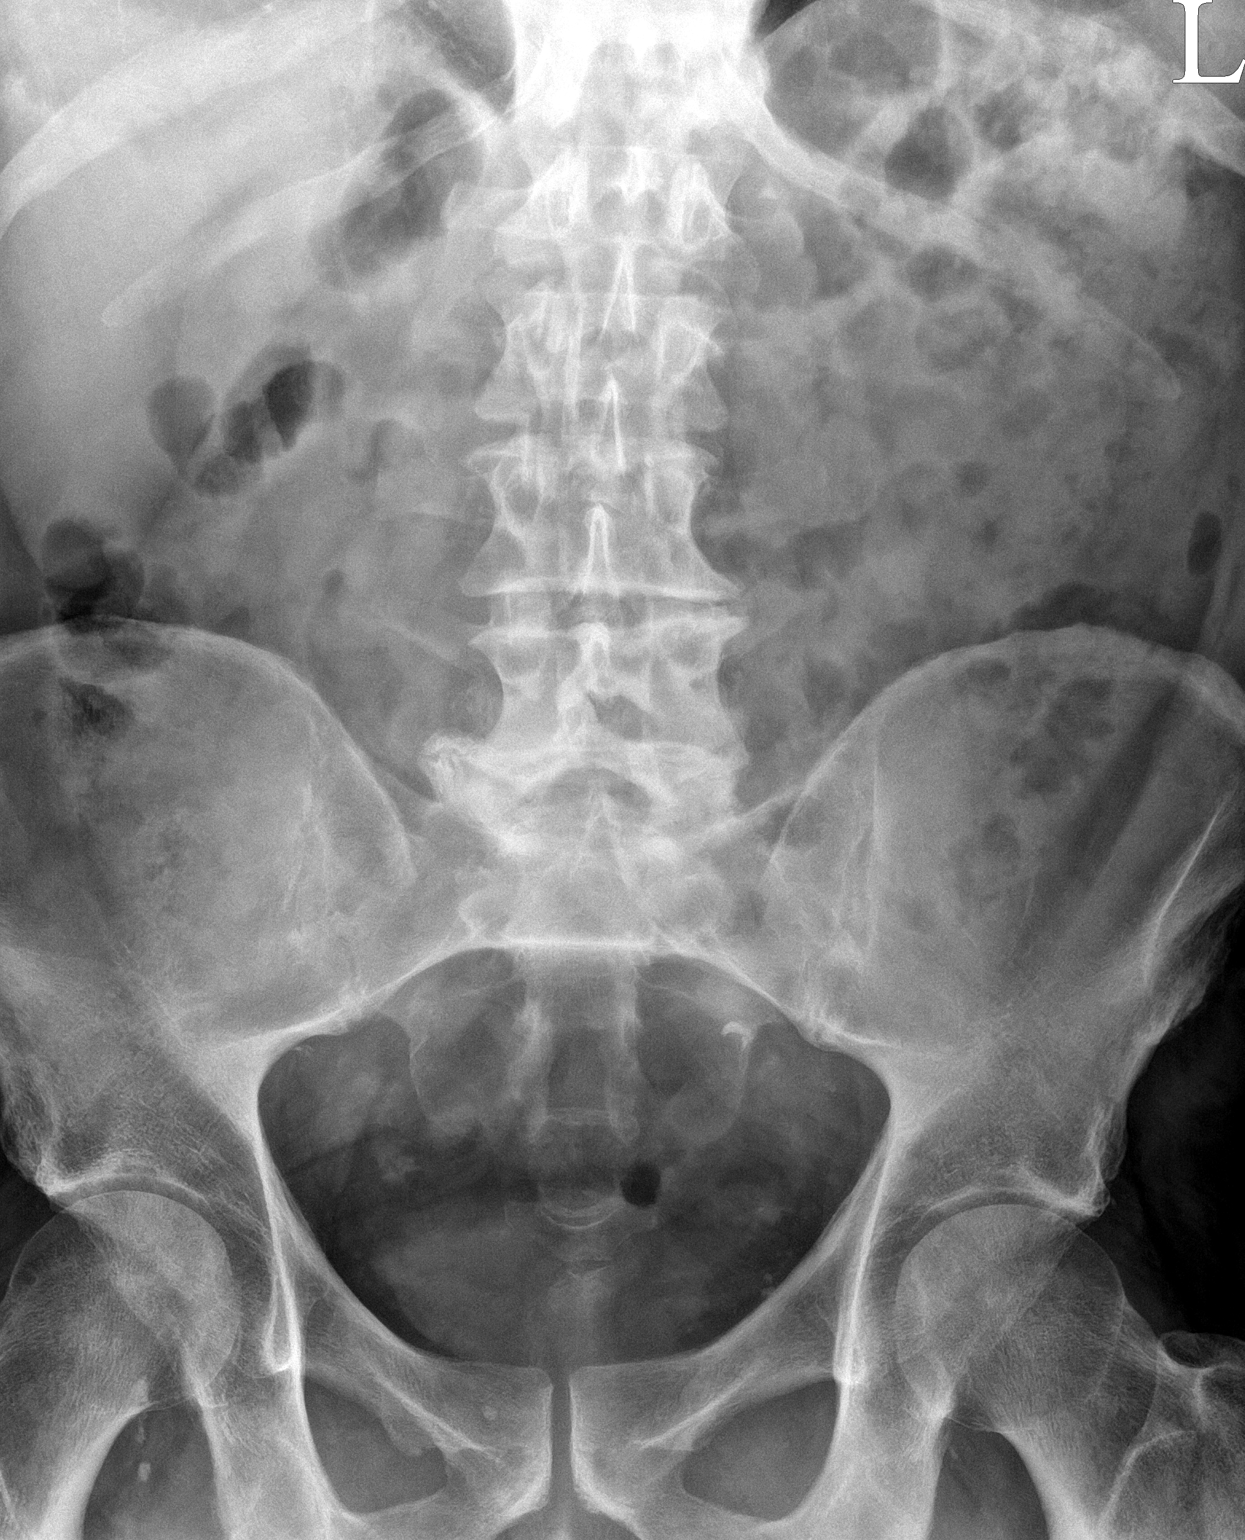

[3 of 3 positions shown; findings below may reference images not displayed]

FINDINGS: There is no evidence of dilated bowel loops or free intraperitoneal
air. No radiopaque calculi or other significant radiographic
abnormality is seen. Heart size and mediastinal contours are within
normal limits. Both lungs are clear.
IMPRESSION: Negative abdominal radiographs.  No acute cardiopulmonary disease.

## 2023-02-22 ENCOUNTER — Encounter: Payer: Self-pay | Admitting: Internal Medicine

## 2023-02-22 MED ORDER — CLONAZEPAM 0.5 MG PO TABS: 0.5000 mg | ORAL_TABLET | Freq: Two times a day (BID) | ORAL | 1 refills | Status: DC | PRN

## 2023-03-07 ENCOUNTER — Other Ambulatory Visit (INDEPENDENT_AMBULATORY_CARE_PROVIDER_SITE_OTHER): Payer: Medicare Other

## 2023-03-07 DIAGNOSIS — D509 Iron deficiency anemia, unspecified: Secondary | ICD-10-CM | POA: Diagnosis not present

## 2023-03-07 LAB — CBC WITH DIFFERENTIAL/PLATELET
Basophils Absolute: 0.1 10*3/uL (ref 0.0–0.1)
Basophils Relative: 1.3 % (ref 0.0–3.0)
Eosinophils Absolute: 0.3 10*3/uL (ref 0.0–0.7)
Eosinophils Relative: 4.1 % (ref 0.0–5.0)
HCT: 40.6 % (ref 39.0–52.0)
Hemoglobin: 13.8 g/dL (ref 13.0–17.0)
Lymphocytes Relative: 21.1 % (ref 12.0–46.0)
Lymphs Abs: 1.7 10*3/uL (ref 0.7–4.0)
MCHC: 34.1 g/dL (ref 30.0–36.0)
MCV: 89.6 fl (ref 78.0–100.0)
Monocytes Absolute: 0.5 10*3/uL (ref 0.1–1.0)
Monocytes Relative: 6.2 % (ref 3.0–12.0)
Neutro Abs: 5.5 10*3/uL (ref 1.4–7.7)
Neutrophils Relative %: 67.3 % (ref 43.0–77.0)
Platelets: 223 10*3/uL (ref 150.0–400.0)
RBC: 4.53 Mil/uL (ref 4.22–5.81)
RDW: 13.1 % (ref 11.5–15.5)
WBC: 8.1 10*3/uL (ref 4.0–10.5)

## 2023-03-13 ENCOUNTER — Encounter: Payer: Self-pay | Admitting: Internal Medicine

## 2023-03-13 ENCOUNTER — Ambulatory Visit (INDEPENDENT_AMBULATORY_CARE_PROVIDER_SITE_OTHER): Payer: Medicare Other | Admitting: Internal Medicine

## 2023-03-13 VITALS — BP 124/76 | HR 58 | Temp 98.3°F | Ht 74.0 in | Wt 192.0 lb

## 2023-03-13 DIAGNOSIS — Z125 Encounter for screening for malignant neoplasm of prostate: Secondary | ICD-10-CM | POA: Diagnosis not present

## 2023-03-13 DIAGNOSIS — N1831 Chronic kidney disease, stage 3a: Secondary | ICD-10-CM | POA: Diagnosis not present

## 2023-03-13 DIAGNOSIS — D485 Neoplasm of uncertain behavior of skin: Secondary | ICD-10-CM | POA: Insufficient documentation

## 2023-03-13 DIAGNOSIS — I10 Essential (primary) hypertension: Secondary | ICD-10-CM

## 2023-03-13 DIAGNOSIS — E538 Deficiency of other specified B group vitamins: Secondary | ICD-10-CM | POA: Diagnosis not present

## 2023-03-13 DIAGNOSIS — I700 Atherosclerosis of aorta: Secondary | ICD-10-CM | POA: Diagnosis not present

## 2023-03-13 DIAGNOSIS — E782 Mixed hyperlipidemia: Secondary | ICD-10-CM

## 2023-03-13 DIAGNOSIS — R739 Hyperglycemia, unspecified: Secondary | ICD-10-CM | POA: Diagnosis not present

## 2023-03-13 DIAGNOSIS — E559 Vitamin D deficiency, unspecified: Secondary | ICD-10-CM

## 2023-03-13 DIAGNOSIS — F419 Anxiety disorder, unspecified: Secondary | ICD-10-CM

## 2023-03-13 DIAGNOSIS — I2510 Atherosclerotic heart disease of native coronary artery without angina pectoris: Secondary | ICD-10-CM | POA: Diagnosis not present

## 2023-03-13 DIAGNOSIS — I2583 Coronary atherosclerosis due to lipid rich plaque: Secondary | ICD-10-CM

## 2023-03-13 DIAGNOSIS — Z0001 Encounter for general adult medical examination with abnormal findings: Secondary | ICD-10-CM | POA: Diagnosis not present

## 2023-03-13 LAB — HEPATIC FUNCTION PANEL
ALT: 16 U/L (ref 0–53)
AST: 19 U/L (ref 0–37)
Albumin: 4.2 g/dL (ref 3.5–5.2)
Alkaline Phosphatase: 33 U/L — ABNORMAL LOW (ref 39–117)
Bilirubin, Direct: 0.2 mg/dL (ref 0.0–0.3)
Total Bilirubin: 0.7 mg/dL (ref 0.2–1.2)
Total Protein: 6.7 g/dL (ref 6.0–8.3)

## 2023-03-13 LAB — LIPID PANEL
Cholesterol: 121 mg/dL (ref 0–200)
HDL: 61.2 mg/dL (ref >39.00)
LDL Cholesterol: 52 mg/dL (ref 0–99)
NonHDL: 60
Total CHOL/HDL Ratio: 2
Triglycerides: 38 mg/dL (ref 0.0–149.0)
VLDL: 7.6 mg/dL (ref 0.0–40.0)

## 2023-03-13 LAB — URINALYSIS, ROUTINE W REFLEX MICROSCOPIC
Bilirubin Urine: NEGATIVE
Hgb urine dipstick: NEGATIVE
Ketones, ur: NEGATIVE
Leukocytes,Ua: NEGATIVE
Nitrite: NEGATIVE
RBC / HPF: NONE SEEN (ref 0–?)
Specific Gravity, Urine: 1.02 (ref 1.000–1.030)
Total Protein, Urine: NEGATIVE
Urine Glucose: NEGATIVE
Urobilinogen, UA: 0.2 (ref 0.0–1.0)
WBC, UA: NONE SEEN (ref 0–?)
pH: 6 (ref 5.0–8.0)

## 2023-03-13 LAB — HEMOGLOBIN A1C: Hgb A1c MFr Bld: 6 % (ref 4.6–6.5)

## 2023-03-13 LAB — VITAMIN B12: Vitamin B-12: 316 pg/mL (ref 211–911)

## 2023-03-13 LAB — TSH: TSH: 0.78 u[IU]/mL (ref 0.35–5.50)

## 2023-03-13 LAB — VITAMIN D 25 HYDROXY (VIT D DEFICIENCY, FRACTURES): VITD: 72.85 ng/mL (ref 30.00–100.00)

## 2023-03-13 LAB — PSA: PSA: 2.68 ng/mL (ref 0.10–4.00)

## 2023-03-13 MED ORDER — AMLODIPINE BESYLATE 10 MG PO TABS: ORAL_TABLET | ORAL | 3 refills | Status: DC

## 2023-03-13 NOTE — Patient Instructions (Addendum)
Ok to continue the klonopin as needed  Please continue all other medications as before, and refills have been done if requested.  Please have the pharmacy call with any other refills you may need.  Please continue your efforts at being more active, low cholesterol diet, and weight control.  You are otherwise up to date with prevention measures today.  Please keep your appointments with your specialists as you may have planned  You will be contacted regarding the referral for: Cardiology - Dr Allyson Sabal  Please go to the LAB at the blood drawing area for the tests to be done  You will be contacted by phone if any changes need to be made immediately.  Otherwise, you will receive a letter about your results with an explanation, but please check with MyChart first.  Please remember to sign up for MyChart if you have not done so, as this will be important to you in the future with finding out test results, communicating by private email, and scheduling acute appointments online when needed.  Please make an Appointment to return in 6 months, or sooner if needed

## 2023-03-13 NOTE — Progress Notes (Signed)
Patient ID: Oscar Dixon, male   DOB: 04-Mar-1946, 77 y.o.   MRN: [id number]         Chief Complaint:: wellness exam and Medical Management of Chronic Issues (6 month follow up , currently dealing with some anxiety )  , aortic atherosclerosis, low vit d, hld, hyperglycenia, htn, ckd, anxiety, cad       HPI:  Oscar Dixon is a 77 y.o. male here for wellness exam; declines covid booster, o/w up to date                        Also has worsening anxiety due to several social stressors, Pt denies chest pain, increased sob or doe, wheezing, orthopnea, PND, increased LE swelling, palpitations, dizziness or syncope, but has been lost to follow with cardiology.   Pt denies polydipsia, polyuria, or new focal neuro s/s.    Pt denies fever, wt loss, night sweats, loss of appetite, or other constitutional symptoms     Wt Readings from Last 3 Encounters:  03/13/23 192 lb (87.1 kg)  10/02/22 194 lb 9.6 oz (88.3 kg)  09/06/22 191 lb (86.6 kg)   BP Readings from Last 3 Encounters:  03/13/23 124/76  10/02/22 119/67  09/06/22 114/66   Immunization History  Administered Date(s) Administered   Influenza Split 09/18/2012, 07/30/2022   Influenza Whole 06/30/2010   Influenza, High Dose Seasonal PF 07/26/2014, 07/19/2015, 07/20/2015, 06/15/2019   Influenza,inj,Quad PF,6+ Mos 09/18/2013   Influenza-Unspecified 07/23/2017, 08/05/2018, 07/11/2021   PFIZER(Purple Top)SARS-COV-2 Vaccination 12/11/2019, 01/05/2020, 08/04/2020   Pfizer Covid-19 Vaccine Bivalent Booster 45yrs & up 08/01/2021   Pneumococcal Conjugate-13 10/27/2013   Pneumococcal Polysaccharide-23 09/22/2014   Td 05/06/2006   Tdap 10/02/2016   Zoster Recombinat (Shingrix) 08/13/2017, 12/26/2017, 02/19/2022   Zoster, Live 06/24/2009   Health Maintenance Due  Topic Date Due   Medicare Annual Wellness (AWV)  Never done      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   Allergy    Arthritis    LEFT hand   CARPAL TUNNEL  SYNDROME, BILATERAL 12/15/2009   Cataract    bilateral sx   DYSPNEA ON EXERTION 12/15/2009   Erectile dysfunction 04/12/2012   FATIGUE 03/27/2010   GERD 05/20/2007   on meds   GOUT 03/27/2010   HAND PAIN 12/15/2009   Headache(784.0) 08/25/2009   HERNIA, UMBILICAL 12/15/2009   HYPERLIPIDEMIA 07/09/2008   on meds   HYPERTENSION 05/20/2007   on meds   LIBIDO, DECREASED 03/27/2010   Mitral regurgitation 09/12/2011   Other specified forms of hearing loss 06/30/2010   Past Surgical History:  Procedure Laterality Date   ANKLE SURGERY Left 1967   CATARACT EXTRACTION Bilateral    COLONOSCOPY  2016   TA's -suprep (exc)   ELBOW SURGERY Right 1970   POLYPECTOMY  2016   TA's    s/p basal cell cancer     UMBILICAL HERNIA REPAIR     UPPER GASTROINTESTINAL ENDOSCOPY      reports that he quit smoking about 36 years ago. His smoking use included cigarettes. He has never used smokeless tobacco. He reports current alcohol use of about 7.0 standard drinks of alcohol per week. He reports that he does not use drugs. family history includes Alcohol abuse in his mother; Hypertension in his father and mother; Lung cancer (age of onset: 63) in his brother; Stroke (age of onset: 74) in his father. Allergies  Allergen Reactions   Augmentin [Amoxicillin-Pot Clavulanate]  Diarrhea    diarrhea   Current Outpatient Medications on File Prior to Visit  Medication Sig Dispense Refill   aspirin 81 MG EC tablet Take 81 mg by mouth daily.     benazepril (LOTENSIN) 40 MG tablet Take 1 tablet (40 mg total) by mouth daily. 90 tablet 3   Cholecalciferol (VITAMIN D3 PO) Take 2,000 Units by mouth daily.     clonazePAM (KLONOPIN) 0.5 MG tablet Take 1 tablet (0.5 mg total) by mouth 2 (two) times daily as needed for anxiety. 30 tablet 1   esomeprazole (NEXIUM) 40 MG capsule TAKE 1 CAPSULE BY MOUTH EVERY DAY AT NOON 90 capsule 2   iron polysaccharides (NIFEREX) 150 MG capsule TAKE 1 CAPSULE(150 MG) BY MOUTH DAILY 90  capsule 1   rosuvastatin (CRESTOR) 20 MG tablet TAKE 1 TABLET(20 MG) BY MOUTH DAILY 90 tablet 3   zolpidem (AMBIEN) 5 MG tablet TAKE 1 TABLET(5 MG) BY MOUTH AT BEDTIME AS NEEDED FOR SLEEP 90 tablet 1   albuterol (PROAIR HFA) 108 (90 Base) MCG/ACT inhaler Inhale 2 puffs into the lungs every 6 (six) hours as needed for wheezing or shortness of breath. (Patient not taking: Reported on 07/31/2022) 8 g 5   No current facility-administered medications on file prior to visit.        ROS:  All others reviewed and negative.  Objective        PE:  BP 124/76 (BP Location: Right Arm, Patient Position: Sitting, Cuff Size: Normal)   Pulse (!) 58   Temp 98.3 F (36.8 C) (Oral)   Ht 6\' 2"  (1.88 m)   Wt 192 lb (87.1 kg)   SpO2 96%   BMI 24.65 kg/m                 Constitutional: Pt appears in NAD               HENT: Head: NCAT.                Right Ear: External ear normal.                 Left Ear: External ear normal.                Eyes: . Pupils are equal, round, and reactive to light. Conjunctivae and EOM are normal               Nose: without d/c or deformity               Neck: Neck supple. Gross normal ROM               Cardiovascular: Normal rate and regular rhythm.                 Pulmonary/Chest: Effort normal and breath sounds without rales or wheezing.                Abd:  Soft, NT, ND, + BS, no organomegaly               Neurological: Pt is alert. At baseline orientation, motor grossly intact               Skin: Skin is warm. No rashes, no other new lesions, LE edema - none               Psychiatric: Pt behavior is normal without agitation , mod nervous  Micro: none  Cardiac tracings I have personally interpreted today:  none  Pertinent Radiological findings (summarize): none   Lab Results  Component Value Date   WBC 8.1 03/07/2023   HGB 13.8 03/07/2023   HCT 40.6 03/07/2023   PLT 223.0 03/07/2023   GLUCOSE 100 (H) 09/06/2022   CHOL 121 03/13/2023   TRIG 38.0 03/13/2023    HDL 61.20 03/13/2023   LDLCALC 52 03/13/2023   ALT 16 03/13/2023   AST 19 03/13/2023   NA 140 09/06/2022   K 4.4 09/06/2022   CL 103 09/06/2022   CREATININE 1.15 09/06/2022   BUN 23 09/06/2022   CO2 29 09/06/2022   TSH 0.78 03/13/2023   PSA 2.68 03/13/2023   HGBA1C 6.0 03/13/2023   Assessment/Plan:  Oscar Dixon is a 77 y.o. White or Caucasian [1] male with  has a past medical history of ADD (07/09/2008), Allergy, Arthritis, CARPAL TUNNEL SYNDROME, BILATERAL (12/15/2009), Cataract, DYSPNEA ON EXERTION (12/15/2009), Erectile dysfunction (04/12/2012), FATIGUE (03/27/2010), GERD (05/20/2007), GOUT (03/27/2010), HAND PAIN (12/15/2009), Headache(784.0) (08/25/2009), HERNIA, UMBILICAL (12/15/2009), HYPERLIPIDEMIA (07/09/2008), HYPERTENSION (05/20/2007), LIBIDO, DECREASED (03/27/2010), Mitral regurgitation (09/12/2011), and Other specified forms of hearing loss (06/30/2010).  Encounter for well adult exam with abnormal findings Age and sex appropriate education and counseling updated with regular exercise and diet Referrals for preventative services - none needed Immunizations addressed - declines covid booster Smoking counseling  - none needed Evidence for depression or other mood disorder - none significant Most recent labs reviewed. I have personally reviewed and have noted: 1) the patient's medical and social history 2) The patient's current medications and supplements 3) The patient's height, weight, and BMI have been recorded in the chart   Aortic atherosclerosis (HCC) Pt to continue diet, excercise, crestor  Vitamin D deficiency Last vitamin D Lab Results  Component Value Date   VD25OH 72.85 03/13/2023   Stable, cont oral replacement   Hyperlipidemia Lab Results  Component Value Date   LDLCALC 52 03/13/2023   Stable, pt to continue current statin crestor 20 qd   Hyperglycemia Lab Results  Component Value Date   HGBA1C 6.0 03/13/2023   Stable, pt to  continue current medical treatment diet, wt control   Essential hypertension BP Readings from Last 3 Encounters:  03/13/23 124/76  10/02/22 119/67  09/06/22 114/66   Stable, pt to continue medical treatment lotensin 40 qd, norvasc 10 qd   CKD (chronic kidney disease) stage 3, GFR 30-59 ml/min (HCC) Lab Results  Component Value Date   CREATININE 1.15 09/06/2022   Stable overall, cont to avoid nephrotoxins  CAD (coronary artery disease) Lost to f/u For referral cardiology  Anxiety With recent worsening, for increaed klonopin 0.5 bid prn,  to f/u any worsening symptoms or concerns  Followup: Return in about 6 months (around 09/13/2023).  Oliver Barre, MD 03/16/2023 8:38 PM [facility]

## 2023-03-13 NOTE — Progress Notes (Signed)
The test results show that your current treatment is OK, as the tests are stable.  Please continue the same plan.  There is no other need for change of treatment or further evaluation based on these results, at this time.  thanks 

## 2023-03-16 ENCOUNTER — Encounter: Payer: Self-pay | Admitting: Internal Medicine

## 2023-03-16 NOTE — Assessment & Plan Note (Signed)

## 2023-03-16 NOTE — Assessment & Plan Note (Signed)
Pt to continue diet, excercise, crestor

## 2023-03-16 NOTE — Assessment & Plan Note (Signed)
Last vitamin D Lab Results  Component Value Date   VD25OH 72.85 03/13/2023   Stable, cont oral replacement

## 2023-03-16 NOTE — Assessment & Plan Note (Signed)
Lab Results  Component Value Date   CREATININE 1.15 09/06/2022   Stable overall, cont to avoid nephrotoxins  

## 2023-03-16 NOTE — Assessment & Plan Note (Signed)
Lost to f/u For referral cardiology

## 2023-03-16 NOTE — Assessment & Plan Note (Signed)
Lab Results  Component Value Date   LDLCALC 52 03/13/2023   Stable, pt to continue current statin crestor 20 qd

## 2023-03-16 NOTE — Assessment & Plan Note (Signed)
BP Readings from Last 3 Encounters:  03/13/23 124/76  10/02/22 119/67  09/06/22 114/66   Stable, pt to continue medical treatment lotensin 40 qd, norvasc 10 qd

## 2023-03-16 NOTE — Assessment & Plan Note (Signed)
With recent worsening, for increaed klonopin 0.5 bid prn,  to f/u any worsening symptoms or concerns

## 2023-03-16 NOTE — Assessment & Plan Note (Signed)
Lab Results  Component Value Date   HGBA1C 6.0 03/13/2023   Stable, pt to continue current medical treatment diet, wt control

## 2023-03-19 ENCOUNTER — Encounter: Payer: Self-pay | Admitting: Internal Medicine

## 2023-04-02 ENCOUNTER — Encounter: Payer: Self-pay | Admitting: Internal Medicine

## 2023-04-03 MED ORDER — ZOLPIDEM TARTRATE 5 MG PO TABS: ORAL_TABLET | ORAL | 1 refills | Status: DC

## 2023-04-05 ENCOUNTER — Other Ambulatory Visit: Payer: Self-pay | Admitting: Internal Medicine

## 2023-04-23 ENCOUNTER — Encounter: Payer: Self-pay | Admitting: Cardiovascular Disease

## 2023-04-23 ENCOUNTER — Ambulatory Visit: Payer: Medicare Other | Attending: Cardiovascular Disease | Admitting: Cardiovascular Disease

## 2023-04-23 VITALS — BP 122/78 | HR 70 | Ht 73.0 in | Wt 192.2 lb

## 2023-04-23 DIAGNOSIS — R931 Abnormal findings on diagnostic imaging of heart and coronary circulation: Secondary | ICD-10-CM | POA: Diagnosis not present

## 2023-04-23 DIAGNOSIS — I2583 Coronary atherosclerosis due to lipid rich plaque: Secondary | ICD-10-CM | POA: Diagnosis not present

## 2023-04-23 DIAGNOSIS — E782 Mixed hyperlipidemia: Secondary | ICD-10-CM

## 2023-04-23 DIAGNOSIS — I2510 Atherosclerotic heart disease of native coronary artery without angina pectoris: Secondary | ICD-10-CM | POA: Diagnosis not present

## 2023-04-23 DIAGNOSIS — I10 Essential (primary) hypertension: Secondary | ICD-10-CM

## 2023-04-23 NOTE — Assessment & Plan Note (Signed)
History of elevated coronary calcium score performed 01/26/2020 which was 1759.  Subsequent Myoview stress test performed 03/08/2020 was low risk and nonischemic and 2D echocardiogram performed 03/28/2020 revealed normal LV systolic function with mild MR.  He is fairly active and denies any symptoms of chest pain or shortness of breath.

## 2023-04-23 NOTE — Progress Notes (Signed)
04/23/2023 [facility]   1946/01/25  [id number]  Primary Physician Jonny Ruiz Len Blalock, MD Primary Cardiologist: Runell Gess MD Nicholes Calamity, [place]  HPI:  Oscar Dixon is a 77 y.o.  mildly overweight married Caucasian male father of 3 daughters, grandfather 7 grandchildren who retired at age 72 from working at [employer] where he was for 40 years.  He was referred by Dr. Oliver Barre, his PCP, for evaluation of a elevated coronary calcium score.  I last saw him in the office 02/24/2020.  He smoked less than 1 pack/day having quit 25 years ago.  History of hypertension hyperlipidemia.  There is no family history of heart disease.  Never had heart attack or stroke.  Does complain of some dyspnea on exertion.  Does drink of scotch daily (Johnny Walker black and Durs).  He is on Crestor 20 with an excellent lipid profile.  He recently had a coronary calcium score performed 01/26/2020 which resulted in 1759.  I performed a Myoview stress testing on him 5//21 which was low risk and nonischemic and a 2D echocardiogram performed at the same time was essentially normal as well with mild MR.  Since I saw him 3 years ago he is remained asymptomatic.  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  His most recent lipid profile performed 03/13/2023 revealed total cholesterol 121, LDL 52 and HDL 61.   Current Meds  Medication Sig   amLODipine (NORVASC) 10 MG tablet TAKE 1 TABLET(10 MG) BY MOUTH DAILY   aspirin 81 MG EC tablet Take 81 mg by mouth daily.   benazepril (LOTENSIN) 40 MG tablet Take 1 tablet (40 mg total) by mouth daily.   Cholecalciferol (VITAMIN D3 PO) Take 2,000 Units by mouth daily.   clonazePAM (KLONOPIN) 0.5 MG tablet TAKE 1 TABLET(0.5 MG) BY MOUTH TWICE DAILY AS NEEDED FOR ANXIETY   esomeprazole (NEXIUM) 40 MG capsule TAKE 1 CAPSULE BY MOUTH EVERY DAY AT NOON   rosuvastatin (CRESTOR) 20 MG tablet TAKE 1 TABLET(20 MG)  BY MOUTH DAILY   zolpidem (AMBIEN) 5 MG tablet TAKE 1 TABLET(5 MG) BY MOUTH AT BEDTIME AS NEEDED FOR SLEEP     Allergies  Allergen Reactions   Augmentin [Amoxicillin-Pot Clavulanate] Diarrhea    diarrhea    Social History   Socioeconomic History   Marital status: Married    Spouse name: Not on file   Number of children: 3   Years of education: college   Highest education level: Not on file  Occupational History   Occupation: Public relations account executive   Tobacco Use   Smoking status: Former    Years: 10    Types: Cigarettes    Quit date: 1988    Years since quitting: 36.4   Smokeless tobacco: Never  Vaping Use   Vaping Use: Never used  Substance and Sexual Activity   Alcohol use: Yes    Alcohol/week: 7.0 standard drinks of alcohol    Types: 7 Standard drinks or equivalent per week   Drug use: Never   Sexual activity: Yes  Other Topics Concern   Not on file  Social History Narrative   Lives w/ wife   Caffeine use: 2 cups coffee every am   Right handed    Social Determinants of Health   Financial Resource Strain: Not on file  Food Insecurity: Not on file  Transportation Needs: Not on file  Physical Activity: Not  on file  Stress: Not on file  Social Connections: Not on file  Intimate Partner Violence: Not on file     Review of Systems: General: negative for chills, fever, night sweats or weight changes.  Cardiovascular: negative for chest pain, dyspnea on exertion, edema, orthopnea, palpitations, paroxysmal nocturnal dyspnea or shortness of breath Dermatological: negative for rash Respiratory: negative for cough or wheezing Urologic: negative for hematuria Abdominal: negative for nausea, vomiting, diarrhea, bright red blood per rectum, melena, or hematemesis Neurologic: negative for visual changes, syncope, or dizziness All other systems reviewed and are otherwise negative except as noted above.    Blood pressure 122/78, pulse 70, height 6\' 1"  (1.854 m), weight 192  lb 3.2 oz (87.2 kg), SpO2 95 %.  General appearance: alert and no distress Neck: no adenopathy, no carotid bruit, no JVD, supple, symmetrical, trachea midline, and thyroid not enlarged, symmetric, no tenderness/mass/nodules Lungs: clear to auscultation bilaterally Heart: regular rate and rhythm, S1, S2 normal, no murmur, click, rub or gallop Extremities: extremities normal, atraumatic, no cyanosis or edema Pulses: 2+ and symmetric Skin: Skin color, texture, turgor normal. No rashes or lesions Neurologic: Grossly normal  EKG EKG Interpretation  Date/Time:  Tuesday April 23 2023 09:00:38 EDT Ventricular Rate:  70 PR Interval:  162 QRS Duration: 96 QT Interval:  398 QTC Calculation: 429 R Axis:   19 Text Interpretation: Sinus rhythm with marked sinus arrhythmia with occasional Premature ventricular complexes When compared with ECG of 12-Mar-2022 16:06, PREVIOUS ECG IS PRESENT Confirmed by Nanetta Batty [id number]) on 04/23/2023 9:03:06 AM    ASSESSMENT AND PLAN:   Hyperlipidemia History of hyperlipidemia on statin therapy with lipid profile performed 03/13/2023 revealing total cholesterol of 121, LDL 52 and HDL of 61.  Essential hypertension History of essential hypertension a blood pressure measured today 122/78.  He is on amlodipine and benazepril.  Elevated coronary artery calcium score History of elevated coronary calcium score performed 01/26/2020 which was 1759.  Subsequent Myoview stress test performed 03/08/2020 was low risk and nonischemic and 2D echocardiogram performed 03/28/2020 revealed normal LV systolic function with mild MR.  He is fairly active and denies any symptoms of chest pain or shortness of breath.      Runell Gess MD FACP,FACC,FAHA, [facility] 04/23/2023 9:15 AM

## 2023-04-23 NOTE — Assessment & Plan Note (Signed)
History of hyperlipidemia on statin therapy with lipid profile performed 03/13/2023 revealing total cholesterol of 121, LDL 52 and HDL of 61.

## 2023-04-23 NOTE — Progress Notes (Deleted)
     04/23/2023 Oscar Dixon   [birth year]  [id number]  Primary Physician Corwin Levins, MD Primary Cardiologist: Runell Gess MD Nicholes Calamity, [place]  HPI:  Oscar Dixon is a 77 y.o. male ***   Current Meds  Medication Sig   amLODipine (NORVASC) 10 MG tablet TAKE 1 TABLET(10 MG) BY MOUTH DAILY   aspirin 81 MG EC tablet Take 81 mg by mouth daily.   benazepril (LOTENSIN) 40 MG tablet Take 1 tablet (40 mg total) by mouth daily.   Cholecalciferol (VITAMIN D3 PO) Take 2,000 Units by mouth daily.   clonazePAM (KLONOPIN) 0.5 MG tablet TAKE 1 TABLET(0.5 MG) BY MOUTH TWICE DAILY AS NEEDED FOR ANXIETY   esomeprazole (NEXIUM) 40 MG capsule TAKE 1 CAPSULE BY MOUTH EVERY DAY AT NOON   rosuvastatin (CRESTOR) 20 MG tablet TAKE 1 TABLET(20 MG) BY MOUTH DAILY   zolpidem (AMBIEN) 5 MG tablet TAKE 1 TABLET(5 MG) BY MOUTH AT BEDTIME AS NEEDED FOR SLEEP     Allergies  Allergen Reactions   Augmentin [Amoxicillin-Pot Clavulanate] Diarrhea    diarrhea    Social History   Socioeconomic History   Marital status: Married    Spouse name: Not on file   Number of children: 3   Years of education: college   Highest education level: Not on file  Occupational History   Occupation: Public relations account executive   Tobacco Use   Smoking status: Former    Years: 10    Types: Cigarettes    Quit date: 1988    Years since quitting: 36.4   Smokeless tobacco: Never  Vaping Use   Vaping Use: Never used  Substance and Sexual Activity   Alcohol use: Yes    Alcohol/week: 7.0 standard drinks of alcohol    Types: 7 Standard drinks or equivalent per week   Drug use: Never   Sexual activity: Yes  Other Topics Concern   Not on file  Social History Narrative   Lives w/ wife   Caffeine use: 2 cups coffee every am   Right handed    Social Determinants of Health   Financial Resource Strain: Not on file  Food Insecurity: Not on file  Transportation Needs: Not on file  Physical Activity: Not on file   Stress: Not on file  Social Connections: Not on file  Intimate Partner Violence: Not on file     Review of Systems: General: negative for chills, fever, night sweats or weight changes.  Cardiovascular: negative for chest pain, dyspnea on exertion, edema, orthopnea, palpitations, paroxysmal nocturnal dyspnea or shortness of breath Dermatological: negative for rash Respiratory: negative for cough or wheezing Urologic: negative for hematuria Abdominal: negative for nausea, vomiting, diarrhea, bright red blood per rectum, melena, or hematemesis Neurologic: negative for visual changes, syncope, or dizziness All other systems reviewed and are otherwise negative except as noted above.    There were no vitals taken for this visit.  {Physical WUJW:[id number]}  EKG      ASSESSMENT AND PLAN:   No problem-specific Assessment & Plan notes found for this encounter.      Runell Gess MD FACP,FACC,FAHA, [facility] 04/23/2023 8:56 AM

## 2023-04-23 NOTE — Assessment & Plan Note (Signed)
History of essential hypertension a blood pressure measured today 122/78.  He is on amlodipine and benazepril.

## 2023-04-23 NOTE — Patient Instructions (Signed)
Medication Instructions:  Your physician recommends that you continue on your current medications as directed. Please refer to the Current Medication list given to you today.  *If you need a refill on your cardiac medications before your next appointment, please call your pharmacy*   Follow-Up: At [facility], you and your health needs are our priority.  As part of our continuing mission to provide you with exceptional heart care, we have created designated Provider Care Teams.  These Care Teams include your primary Cardiologist (physician) and Advanced Practice Providers (APPs -  Physician Assistants and Nurse Practitioners) who all work together to provide you with the care you need, when you need it.  We recommend signing up for the patient portal called "MyChart".  Sign up information is provided on this After Visit Summary.  MyChart is used to connect with patients for Virtual Visits (Telemedicine).  Patients are able to view lab/test results, encounter notes, upcoming appointments, etc.  Non-urgent messages can be sent to your provider as well.   To learn more about what you can do with MyChart, go to [email or web address].    Your next appointment:   We will see you on an as needed basis.  Provider:   Jonathan Berry, MD  

## 2023-05-24 ENCOUNTER — Telehealth (INDEPENDENT_AMBULATORY_CARE_PROVIDER_SITE_OTHER): Payer: Medicare Other | Admitting: Family

## 2023-05-24 ENCOUNTER — Encounter: Payer: Self-pay | Admitting: Internal Medicine

## 2023-05-24 VITALS — Temp 98.3°F | Ht 73.0 in | Wt 195.0 lb

## 2023-05-24 DIAGNOSIS — U071 COVID-19: Secondary | ICD-10-CM | POA: Diagnosis not present

## 2023-05-24 MED ORDER — NIRMATRELVIR/RITONAVIR (PAXLOVID)TABLET: 3.0000 | ORAL_TABLET | Freq: Two times a day (BID) | ORAL | 0 refills | Status: AC

## 2023-05-24 NOTE — Progress Notes (Signed)
Oscar Dixon is a 77 y.o. male with the following history as recorded in EpicCare:  Patient Active Problem List   Diagnosis Date Noted   Neoplasm of uncertain behavior of skin 03/13/2023   Anxiety 03/13/2023   CAD (coronary artery disease) 03/13/2023   Encounter for well adult exam with abnormal findings 09/06/2022   Early satiety 04/21/2022   Fatigue 04/19/2022   Unintentional weight loss 04/19/2022   Iron deficiency anemia 03/19/2022   Dyspnea 03/01/2022   CKD (chronic kidney disease) stage 3, GFR 30-59 ml/min (HCC) 03/01/2022   Actinic keratosis 10/27/2021   Personal history of other malignant neoplasm of skin 10/27/2021   Abdominal pain 06/29/2021   Anemia 06/29/2021   Vitamin D deficiency 12/21/2020   Aortic atherosclerosis (HCC) 12/21/2020   Allergic rhinitis 11/20/2020   Pain of left thumb 03/15/2020   Elevated coronary artery calcium score 02/24/2020   Cough 12/13/2017   Plantar fasciitis of left foot 08/02/2017   Fever 01/31/2017   Allergic conjunctivitis 10/02/2016   Hyperglycemia 10/02/2016   Scapular dyskinesis 06/08/2016   Nonallopathic lesion of thoracic region 06/08/2016   Nonallopathic lesion of rib cage 06/08/2016   Nonallopathic lesion of lumbosacral region 06/08/2016   Arthritis of hand, 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/22/2014   Insomnia 09/22/2014   Bilateral hearing loss 09/22/2014   Eustachian tube dysfunction 10/27/2013   Acute sinus infection 10/07/2013   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   Left thyroid nodule 11/28/2011   Mitral regurgitation 09/12/2011   Preventative health care 09/07/2011   Other specified forms of hearing loss 06/30/2010   GOUT 03/27/2010   FATIGUE 03/27/2010   CARPAL TUNNEL SYNDROME, BILATERAL 12/15/2009   HERNIA, UMBILICAL 12/15/2009   HAND PAIN 12/15/2009   Carpal tunnel syndrome, bilateral 12/15/2009   Headache(784.0) 08/25/2009   Hyperlipidemia 07/09/2008   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   Essential hypertension 05/20/2007   GERD  05/20/2007    Current Outpatient Medications  Medication Sig Dispense Refill   amLODipine (NORVASC) 10 MG tablet TAKE 1 TABLET(10 MG) BY MOUTH DAILY 90 tablet 3   aspirin 81 MG EC tablet Take 81 mg by mouth daily.     benazepril (LOTENSIN) 40 MG tablet Take 1 tablet (40 mg total) by mouth daily. 90 tablet 3   Cholecalciferol (VITAMIN D3 PO) Take 2,000 Units by mouth daily.     clonazePAM (KLONOPIN) 0.5 MG tablet TAKE 1 TABLET(0.5 MG) BY MOUTH TWICE DAILY AS NEEDED FOR ANXIETY 30 tablet 2   esomeprazole (NEXIUM) 40 MG capsule TAKE 1 CAPSULE BY MOUTH EVERY DAY AT NOON 90 capsule 2   nirmatrelvir/ritonavir (PAXLOVID) 20 x 150 MG & 10 x 100MG  TABS Take 3 tablets by mouth 2 (two) times daily for 5 days. (Take nirmatrelvir 150 mg two tablets twice daily for 5 days and ritonavir 100 mg one tablet twice daily for 5 days) Patient GFR is 62 30 tablet 0   rosuvastatin (CRESTOR) 20 MG tablet TAKE 1 TABLET(20 MG) BY MOUTH DAILY 90 tablet 3   zolpidem (AMBIEN) 5 MG tablet TAKE 1 TABLET(5 MG) BY MOUTH AT BEDTIME AS NEEDED FOR SLEEP 90 tablet 1   No current facility-administered medications for this visit.    Allergies: Augmentin [amoxicillin-pot clavulanate]  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   Cataract    bilateral sx   DYSPNEA ON EXERTION 12/15/2009   Erectile dysfunction 04/12/2012   FATIGUE 03/27/2010  GERD 05/20/2007   on meds   GOUT 03/27/2010   HAND PAIN 12/15/2009   Headache(784.0) 08/25/2009   HERNIA, UMBILICAL 12/15/2009   HYPERLIPIDEMIA 07/09/2008   on meds   HYPERTENSION 05/20/2007   on meds   LIBIDO, DECREASED 03/27/2010   Mitral regurgitation 09/12/2011   Other specified forms of hearing loss 06/30/2010    Past Surgical History:  Procedure Laterality Date   ANKLE SURGERY Left 1967   CATARACT EXTRACTION Bilateral    COLONOSCOPY  2016   TA's -suprep (exc)   ELBOW  SURGERY Right 1970   POLYPECTOMY  2016   TA's    s/p basal cell cancer     UMBILICAL HERNIA REPAIR     UPPER GASTROINTESTINAL ENDOSCOPY      Family History  Problem Relation Age of Onset   Hypertension Mother    Alcohol abuse Mother        ETOH   Hypertension Father    Stroke Father 75   Lung cancer Brother 41   Colon polyps Neg Hx    Colon cancer Neg Hx    Esophageal cancer Neg Hx    Stomach cancer Neg Hx    Rectal cancer Neg Hx    Inflammatory bowel disease Neg Hx    Liver disease Neg Hx    Pancreatic cancer Neg Hx     Social History   Tobacco Use   Smoking status: Former    Current packs/day: 0.00    Types: Cigarettes    Start date: 6    Quit date: 1988    Years since quitting: 36.5   Smokeless tobacco: Never  Substance Use Topics   Alcohol use: Yes    Alcohol/week: 7.0 standard drinks of alcohol    Types: 7 Standard drinks or equivalent per week    Subjective:    I connected with Oscar Dixon on 05/24/23 at  2:20 PM EDT by a video enabled telemedicine application and verified that I am speaking with the correct person using two identifiers.   I discussed the limitations of evaluation and management by telemedicine and the availability of in person appointments. The patient expressed understanding and agreed to proceed. Provider in office/ patient is at home; provider and patient are only 2 people on video call.   Tested positive for COVID this morning; symptoms started 2 days ago; sore throat/ scratchy throat; + fatigue/ + cough; would like to take Paxlovid; has used in the past and tolerated well;  Last GFR documented in November 2023 at 37;    Objective:  Vitals:   05/24/23 1429  Temp: 98.3 F (36.8 C)  TempSrc: Oral  Weight: 195 lb (88.5 kg)  Height: 6\' 1"  (1.854 m)    General: Well developed, well nourished, in no acute distress  Skin : Warm and dry.  Head: Normocephalic and atraumatic  Lungs: Respirations unlabored;  Neurologic: Alert  and oriented; speech intact; face symmetrical; moves all extremities well; CNII-XII intact without focal deficit   Assessment:  1. COVID-19     Plan:  Rx for Paxlovid- take as directed; hold Crestor x 5 days; increase fluids, symptomatic treatment; follow up with his PCP if symptoms persist.   No follow-ups on file.  No orders of the defined types were placed in this encounter.   Requested Prescriptions   Signed Prescriptions Disp Refills   nirmatrelvir/ritonavir (PAXLOVID) 20 x 150 MG & 10 x 100MG  TABS 30 tablet 0    Sig: Take 3 tablets by mouth  2 (two) times daily for 5 days. (Take nirmatrelvir 150 mg two tablets twice daily for 5 days and ritonavir 100 mg one tablet twice daily for 5 days) Patient GFR is 62

## 2023-05-24 NOTE — Progress Notes (Signed)
Started Wednesday night/Thursday Scratchy throat Cough Sneezing Fatigue  Covid positive today  Taking aleve - no other OTC meds

## 2023-06-06 ENCOUNTER — Encounter: Payer: Self-pay | Admitting: Internal Medicine

## 2023-06-09 ENCOUNTER — Encounter: Payer: Self-pay | Admitting: Internal Medicine

## 2023-06-14 ENCOUNTER — Encounter: Payer: Self-pay | Admitting: Internal Medicine

## 2023-07-22 ENCOUNTER — Other Ambulatory Visit: Payer: Self-pay | Admitting: Internal Medicine

## 2023-09-05 ENCOUNTER — Other Ambulatory Visit (INDEPENDENT_AMBULATORY_CARE_PROVIDER_SITE_OTHER): Payer: Medicare Other

## 2023-09-05 DIAGNOSIS — D509 Iron deficiency anemia, unspecified: Secondary | ICD-10-CM

## 2023-09-05 LAB — CBC WITH DIFFERENTIAL/PLATELET
Basophils Absolute: 0.1 10*3/uL (ref 0.0–0.1)
Basophils Relative: 0.8 % (ref 0.0–3.0)
Eosinophils Absolute: 0.5 10*3/uL (ref 0.0–0.7)
Eosinophils Relative: 6.1 % — ABNORMAL HIGH (ref 0.0–5.0)
HCT: 41.9 % (ref 39.0–52.0)
Hemoglobin: 13.8 g/dL (ref 13.0–17.0)
Lymphocytes Relative: 20.5 % (ref 12.0–46.0)
Lymphs Abs: 1.8 10*3/uL (ref 0.7–4.0)
MCHC: 32.8 g/dL (ref 30.0–36.0)
MCV: 90.9 fL (ref 78.0–100.0)
Monocytes Absolute: 0.7 10*3/uL (ref 0.1–1.0)
Monocytes Relative: 7.6 % (ref 3.0–12.0)
Neutro Abs: 5.8 10*3/uL (ref 1.4–7.7)
Neutrophils Relative %: 65 % (ref 43.0–77.0)
Platelets: 235 10*3/uL (ref 150.0–400.0)
RBC: 4.61 Mil/uL (ref 4.22–5.81)
RDW: 13.1 % (ref 11.5–15.5)
WBC: 8.9 10*3/uL (ref 4.0–10.5)

## 2023-09-06 NOTE — Progress Notes (Signed)
The test results show that your current treatment is OK, as the tests are stable.  Please continue the same plan.  There is no other need for change of treatment or further evaluation based on these results, at this time.  thanks 

## 2023-09-13 ENCOUNTER — Ambulatory Visit (INDEPENDENT_AMBULATORY_CARE_PROVIDER_SITE_OTHER): Payer: Medicare Other

## 2023-09-13 VITALS — Ht 74.0 in | Wt 195.0 lb

## 2023-09-13 DIAGNOSIS — Z0000 Encounter for general adult medical examination without abnormal findings: Secondary | ICD-10-CM

## 2023-09-13 NOTE — Progress Notes (Signed)
Subjective:   Oscar Dixon is a 77 y.o. male who presents for an Initial Medicare Annual Wellness Visit.  Visit Complete: Virtual I connected with  [name] on 09/13/23 by a audio enabled telemedicine application and verified that I am speaking with the correct person using two identifiers.  Patient Location: Home  Provider Location: Home Office  I discussed the limitations of evaluation and management by telemedicine. The patient expressed understanding and agreed to proceed.  Vital Signs: Because this visit was a virtual/telehealth visit, some criteria may be missing or patient reported. Any vitals not documented were not able to be obtained and vitals that have been documented are patient reported.  Patient Medicare AWV questionnaire was completed by the patient on 09/12/23; I have confirmed that all information answered by patient is correct and no changes since this date.  Cardiac Risk Factors include: male gender;family history of premature cardiovascular disease;hypertension;Other (see comment);dyslipidemia, Risk factor comments: Mitral regurgitation, CAD, Aortic atherosclerosis, CKD     Objective:    Today's Vitals   09/13/23 0906  Weight: 195 lb (88.5 kg)  Height: 6\' 2"  (1.88 m)   Body mass index is 25.04 kg/m.     09/13/2023    9:15 AM 03/12/2022    4:11 PM 04/28/2015    8:21 AM 04/14/2015    7:57 AM  Advanced Directives  Does Patient Have a Medical Advance Directive? Yes No Yes Yes  Type of Estate agent of [place];Living will   Healthcare Power of [place];Living will  Copy of Healthcare Power of Attorney in Chart? No - copy requested  No - copy requested     Current Medications (verified) Outpatient Encounter Medications as of 09/13/2023  Medication Sig   amLODipine (NORVASC) 10 MG tablet TAKE 1 TABLET(10 MG) BY MOUTH DAILY   aspirin 81 MG EC tablet Take 81 mg by mouth daily.   benazepril (LOTENSIN) 40 MG tablet Take 1 tablet  (40 mg total) by mouth daily.   Cholecalciferol (VITAMIN D3 PO) Take 2,000 Units by mouth daily.   clonazePAM (KLONOPIN) 0.5 MG tablet TAKE 1 TABLET(0.5 MG) BY MOUTH TWICE DAILY AS NEEDED FOR ANXIETY   esomeprazole (NEXIUM) 40 MG capsule TAKE 1 CAPSULE BY MOUTH EVERY DAY AT NOON   rosuvastatin (CRESTOR) 20 MG tablet TAKE 1 TABLET(20 MG) BY MOUTH DAILY   zolpidem (AMBIEN) 5 MG tablet TAKE 1 TABLET(5 MG) BY MOUTH AT BEDTIME AS NEEDED FOR SLEEP   No facility-administered encounter medications on file as of 09/13/2023.    Allergies (verified) Augmentin [amoxicillin-pot clavulanate]   History: 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   Cataract    bilateral sx   DYSPNEA ON EXERTION 12/15/2009   Erectile dysfunction 04/12/2012   FATIGUE 03/27/2010   GERD 05/20/2007   on meds   GOUT 03/27/2010   HAND PAIN 12/15/2009   Headache(784.0) 08/25/2009   HERNIA, UMBILICAL 12/15/2009   HYPERLIPIDEMIA 07/09/2008   on meds   HYPERTENSION 05/20/2007   on meds   LIBIDO, DECREASED 03/27/2010   Mitral regurgitation 09/12/2011   Other specified forms of hearing loss 06/30/2010   Past Surgical History:  Procedure Laterality Date   ANKLE SURGERY Left 1967   CATARACT EXTRACTION Bilateral    COLONOSCOPY  2016   TA's -suprep (exc)   ELBOW SURGERY Right 1970   POLYPECTOMY  2016  TA's    s/p basal cell cancer     UMBILICAL HERNIA REPAIR     UPPER GASTROINTESTINAL ENDOSCOPY     Family History  Problem Relation Age of Onset   Hypertension Mother    Alcohol abuse Mother        ETOH   Hypertension Father    Stroke Father 71   Lung cancer Brother 78   Colon polyps Neg Hx    Colon cancer Neg Hx    Esophageal cancer Neg Hx    Stomach cancer Neg Hx    Rectal cancer Neg Hx    Inflammatory bowel disease Neg Hx    Liver disease Neg Hx    Pancreatic cancer Neg Hx    Social History    Socioeconomic History   Marital status: Married    Spouse name: Debbie   Number of children: 3   Years of education: college   Highest education level: Not on file  Occupational History   Occupation: Market researcher  Tobacco Use   Smoking status: Former    Current packs/day: 0.00    Types: Cigarettes    Start date: 1978    Quit date: 1988    Years since quitting: 36.8   Smokeless tobacco: Never  Vaping Use   Vaping status: Never Used  Substance and Sexual Activity   Alcohol use: Yes    Alcohol/week: 7.0 standard drinks of alcohol    Types: 7 Standard drinks or equivalent per week   Drug use: Never   Sexual activity: Yes  Other Topics Concern   Not on file  Social History Narrative   Lives w/ wife   Caffeine use: 2 cups coffee every am   Right handed    Social Determinants of Health   Financial Resource Strain: Low Risk  (09/12/2023)   Overall Financial Resource Strain (CARDIA)    Difficulty of Paying Living Expenses: Not hard at all  Food Insecurity: No Food Insecurity (09/12/2023)   Hunger Vital Sign    Worried About Running Out of Food in the Last Year: Never true    Ran Out of Food in the Last Year: Never true  Transportation Needs: No Transportation Needs (09/12/2023)   PRAPARE - Administrator, Civil Service (Medical): No    Lack of Transportation (Non-Medical): No  Physical Activity: Sufficiently Active (09/12/2023)   Exercise Vital Sign    Days of Exercise per Week: 4 days    Minutes of Exercise per Session: 40 min  Stress: No Stress Concern Present (09/12/2023)   [employer] of Occupational Health - Occupational Stress Questionnaire    Feeling of Stress : Only a little  Social Connections: Unknown (09/12/2023)   Social Connection and Isolation Panel [NHANES]    Frequency of Communication with Friends and Family: More than three times a week    Frequency of Social Gatherings with Friends and Family: Twice a week    Attends  Religious Services: Not on Marketing executive or Organizations: Yes    Attends Engineer, structural: More than 4 times per year    Marital Status: Married    Tobacco Counseling Counseling given: Not Answered   Clinical Intake:  Pre-visit preparation completed: Yes  Pain : No/denies pain     BMI - recorded: 25.04 Nutritional Status: BMI 25 -29 Overweight Nutritional Risks: None Diabetes: No  How often do you need to have someone help you when you read instructions, pamphlets, or  other written materials from your doctor or pharmacy?: 1 - Never  Interpreter Needed?: No  Information entered by :: Jerel Sardina, RMA   Activities of Daily Living    09/12/2023    2:13 PM  In your present state of health, do you have any difficulty performing the following activities:  Hearing? 0  Vision? 0  Difficulty concentrating or making decisions? 0  Walking or climbing stairs? 0  Dressing or bathing? 0  Doing errands, shopping? 0  Preparing Food and eating ? N  Using the Toilet? N  In the past six months, have you accidently leaked urine? N  Do you have problems with loss of bowel control? N  Managing your Medications? N  Managing your Finances? N  Housekeeping or managing your Housekeeping? N    Patient Care Team: Corwin Levins, MD as PCP - General Haroldine Laws Gasper Lloyd, MD as Consulting Physician (Otolaryngology) Antony Contras, MD as Consulting Physician (Ophthalmology)  Indicate any recent Medical Services you may have received from other than [facility] providers in the past year (date may be approximate).     Assessment:   This is a routine wellness examination for Oscar Dixon.  Hearing/Vision screen Hearing Screening - Comments:: Denies hearing difficulties   Vision Screening - Comments:: Wears RX sunglasses    Goals Addressed               This Visit's Progress     Patient Stated (pt-stated)        Stay healthy      Depression Screen     09/13/2023    9:18 AM 03/13/2023    8:11 AM 09/06/2022    8:57 AM 09/06/2022    8:28 AM 02/26/2022    8:17 AM 02/26/2022    8:01 AM 12/27/2021    9:36 AM  PHQ 2/9 Scores  PHQ - 2 Score 0 0 0 0 0 0 0  PHQ- 9 Score 0 1  1       Fall Risk    09/12/2023    2:13 PM 03/13/2023    8:11 AM 09/06/2022    8:57 AM 09/06/2022    8:28 AM 02/26/2022    8:17 AM  Fall Risk   Falls in the past year? 0 0 0 0 0  Number falls in past yr: 0 0 0  0  Injury with Fall? 0 0 0  0  Risk for fall due to :  No Fall Risks  No Fall Risks   Follow up Falls evaluation completed;Falls prevention discussed Falls evaluation completed  Falls evaluation completed     MEDICARE RISK AT HOME: Medicare Risk at Home Any stairs in or around the home?: Yes If so, are there any without handrails?: No Home free of loose throw rugs in walkways, pet beds, electrical cords, etc?: No Adequate lighting in your home to reduce risk of falls?: Yes Life alert?: No Use of a cane, walker or w/c?: No Grab bars in the bathroom?: Yes Shower chair or bench in shower?: Yes Elevated toilet seat or a handicapped toilet?: Yes  TIMED UP AND GO:  Was the test performed? No    Cognitive Function:        09/13/2023    9:15 AM  6CIT Screen  What Year? 0 points  What month? 0 points  What time? 0 points  Count back from 20 0 points  Months in reverse 0 points  Repeat phrase 0 points  Total Score 0 points  Immunizations Immunization History  Administered Date(s) Administered   Influenza Split 09/18/2012, 07/30/2022   Influenza Whole 06/30/2010   Influenza, High Dose Seasonal PF 07/26/2014, 07/19/2015, 07/20/2015, 06/15/2019   Influenza,inj,Quad PF,6+ Mos 09/18/2013   Influenza-Unspecified 07/23/2017, 08/05/2018, 07/11/2021   PFIZER(Purple Top)SARS-COV-2 Vaccination 12/11/2019, 01/05/2020, 08/04/2020   Pfizer Covid-19 Vaccine Bivalent Booster 14yrs & up 08/01/2021   Pneumococcal Conjugate-13 10/27/2013   Pneumococcal  Polysaccharide-23 09/22/2014   Td 05/06/2006   Tdap 10/02/2016   Zoster Recombinant(Shingrix) 08/13/2017, 12/26/2017, 02/19/2022   Zoster, Live 06/24/2009    TDAP status: Up to date  Flu Vaccine status: Up to date  Pneumococcal vaccine status: Up to date  Covid-19 vaccine status: Completed vaccines  Qualifies for Shingles Vaccine? Yes   Zostavax completed No   Shingrix Completed?: Yes  Screening Tests Health Maintenance  Topic Date Due   INFLUENZA VACCINE  06/06/2023   COVID-19 Vaccine (5 - 2023-24 season) 07/07/2023   Colonoscopy  03/28/2024   Medicare Annual Wellness (AWV)  09/12/2024   DTaP/Tdap/Td (3 - Td or Tdap) 10/02/2026   Pneumonia Vaccine 48+ Years old  Completed   Hepatitis C Screening  Completed   Zoster Vaccines- Shingrix  Completed   HPV VACCINES  Aged Out    Health Maintenance  Health Maintenance Due  Topic Date Due   INFLUENZA VACCINE  06/06/2023   COVID-19 Vaccine (5 - 2023-24 season) 07/07/2023    Colorectal cancer screening: Type of screening: Colonoscopy. Completed 03/28/2021. Repeat every 3 years  Lung Cancer Screening: (Low Dose CT Chest recommended if Age 4-80 years, 20 pack-year currently smoking OR have quit w/in 15years.) does not qualify.   Lung Cancer Screening Referral: N/A  Additional Screening:  Hepatitis C Screening: does qualify; Completed 09/27/2015  Vision Screening: Recommended annual ophthalmology exams for early detection of glaucoma and other disorders of the eye. Is the patient up to date with their annual eye exam?  Yes  Who is the provider or what is the name of the office in which the patient attends annual eye exams? Dr. Randon Goldsmith If pt is not established with a provider, would they like to be referred to a provider to establish care? No .   Dental Screening: Recommended annual dental exams for proper oral hygiene   Community Resource Referral / Chronic Care Management: CRR required this visit?  No   CCM required  this visit?  No    Plan:     I have personally reviewed and noted the following in the patient's chart:   Medical and social history Use of alcohol, tobacco or illicit drugs  Current medications and supplements including opioid prescriptions. Patient is not currently taking opioid prescriptions. Functional ability and status Nutritional status Physical activity Advanced directives List of other physicians Hospitalizations, surgeries, and ER visits in previous 12 months Vitals Screenings to include cognitive, depression, and falls Referrals and appointments  In addition, I have reviewed and discussed with patient certain preventive protocols, quality metrics, and best practice recommendations. A written personalized care plan for preventive services as well as general preventive health recommendations were provided to patient.     Jenipher Havel L Aide Wojnar, CMA   09/13/2023   After Visit Summary: (MyChart) Due to this being a telephonic visit, the after visit summary with patients personalized plan was offered to patient via MyChart   Nurse Notes: Patient is up to date with all his health maintenance.  He had no concerns to address today.  Patient would like to wait to schedule next year's  AWV.

## 2023-09-13 NOTE — Patient Instructions (Signed)
Oscar Dixon , Thank you for taking time to come for your Medicare Wellness Visit. I appreciate your ongoing commitment to your health goals. Please review the following plan we discussed and let me know if I can assist you in the future.   Referrals/Orders/Follow-Ups/Clinician Recommendations: Keep up the good work.  This is a list of the screening recommended for you and due dates:  Health Maintenance  Topic Date Due   COVID-19 Vaccine (5 - 2023-24 season) 09/29/2023*   Colon Cancer Screening  03/28/2024   Medicare Annual Wellness Visit  09/12/2024   DTaP/Tdap/Td vaccine (3 - Td or Tdap) 10/02/2026   Pneumonia Vaccine  Completed   Flu Shot  Completed   Hepatitis C Screening  Completed   Zoster (Shingles) Vaccine  Completed   HPV Vaccine  Aged Out  *Topic was postponed. The date shown is not the original due date.    Advanced directives: (Copy Requested) Please bring a copy of your health care power of attorney and living will to the office to be added to your chart at your convenience.  Next Medicare Annual Wellness Visit scheduled for next year: No

## 2023-09-16 ENCOUNTER — Other Ambulatory Visit: Payer: Self-pay | Admitting: Internal Medicine

## 2023-09-16 ENCOUNTER — Other Ambulatory Visit: Payer: Self-pay

## 2023-09-17 ENCOUNTER — Ambulatory Visit: Payer: Medicare Other | Admitting: Internal Medicine

## 2023-09-17 ENCOUNTER — Encounter: Payer: Self-pay | Admitting: Internal Medicine

## 2023-09-17 VITALS — BP 120/72 | HR 57 | Temp 97.8°F | Ht 74.0 in | Wt 191.0 lb

## 2023-09-17 DIAGNOSIS — J309 Allergic rhinitis, unspecified: Secondary | ICD-10-CM

## 2023-09-17 DIAGNOSIS — E559 Vitamin D deficiency, unspecified: Secondary | ICD-10-CM | POA: Diagnosis not present

## 2023-09-17 DIAGNOSIS — I10 Essential (primary) hypertension: Secondary | ICD-10-CM | POA: Diagnosis not present

## 2023-09-17 DIAGNOSIS — E538 Deficiency of other specified B group vitamins: Secondary | ICD-10-CM

## 2023-09-17 DIAGNOSIS — N1831 Chronic kidney disease, stage 3a: Secondary | ICD-10-CM | POA: Diagnosis not present

## 2023-09-17 DIAGNOSIS — R739 Hyperglycemia, unspecified: Secondary | ICD-10-CM | POA: Diagnosis not present

## 2023-09-17 DIAGNOSIS — E782 Mixed hyperlipidemia: Secondary | ICD-10-CM | POA: Diagnosis not present

## 2023-09-17 DIAGNOSIS — Z125 Encounter for screening for malignant neoplasm of prostate: Secondary | ICD-10-CM

## 2023-09-17 DIAGNOSIS — Z23 Encounter for immunization: Secondary | ICD-10-CM

## 2023-09-17 NOTE — Progress Notes (Unsigned)
Patient ID: Oscar Dixon, male   DOB: 1946/09/13, 77 y.o.   MRN: [id number]        Chief Complaint: follow up HTN, HLD and hyperglycemia , ow vit d       HPI:  ILLIAS Dixon is a 77 y.o. male here overall doin ok,  Pt denies chest pain, increased sob or doe, wheezing, orthopnea, PND, increased LE swelling, palpitations, dizziness or syncope.   Pt denies polydipsia, polyuria, or new focal neuro s/s.    Pt denies fever, wt loss, night sweats, loss of appetite, or other constitutional symptoms  Due for prevnar 20 today, and RSV at pharmacy.  Does have several wks ongoing nasal allergy symptoms with clearish congestion, itch and sneezing, without fever, pain, ST, cough, swelling or wheezing. 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   Cataract    bilateral sx   DYSPNEA ON EXERTION 12/15/2009   Erectile dysfunction 04/12/2012   FATIGUE 03/27/2010   GERD 05/20/2007   on meds   GOUT 03/27/2010   HAND PAIN 12/15/2009   Headache(784.0) 08/25/2009   HERNIA, UMBILICAL 12/15/2009   HYPERLIPIDEMIA 07/09/2008   on meds   HYPERTENSION 05/20/2007   on meds   LIBIDO, DECREASED 03/27/2010   Mitral regurgitation 09/12/2011   Other specified forms of hearing loss 06/30/2010   Past Surgical History:  Procedure Laterality Date   ANKLE SURGERY Left 1967   CATARACT EXTRACTION Bilateral    COLONOSCOPY  2016   TA's -suprep (exc)   ELBOW SURGERY Right 1970   POLYPECTOMY  2016   TA's    s/p basal cell cancer     UMBILICAL HERNIA REPAIR     UPPER GASTROINTESTINAL ENDOSCOPY      reports that he quit smoking about 36 years ago. His smoking use included cigarettes. He started  smoking about 46 years ago. He has never used smokeless tobacco. He reports current alcohol use of about 7.0 standard drinks of alcohol per week. He reports that he does not use drugs. family history includes Alcohol abuse in his mother; Hypertension in his father and mother; Lung cancer (age of onset: 33) in his brother; Stroke (age of onset: 36) in his father. Allergies  Allergen Reactions   Augmentin [Amoxicillin-Pot Clavulanate] Diarrhea    diarrhea   Current Outpatient Medications on File Prior to Visit  Medication Sig Dispense Refill   amLODipine (NORVASC) 10 MG tablet TAKE 1 TABLET(10 MG) BY MOUTH DAILY 90 tablet 3   aspirin 81 MG EC tablet Take 81 mg by mouth daily.     benazepril (LOTENSIN) 40 MG tablet Take 1 tablet (40 mg total) by mouth daily. 90 tablet 3   Cholecalciferol (VITAMIN D3 PO) Take 2,000 Units by mouth daily.     clonazePAM (KLONOPIN) 0.5 MG tablet TAKE 1 TABLET(0.5 MG) BY MOUTH TWICE DAILY AS NEEDED FOR ANXIETY 60 tablet 2   esomeprazole (NEXIUM) 40 MG capsule TAKE 1 CAPSULE BY MOUTH EVERY DAY AT NOON 90 capsule 2   rosuvastatin (CRESTOR) 20 MG tablet TAKE 1 TABLET(20 MG) BY MOUTH DAILY 90 tablet  3   zolpidem (AMBIEN) 5 MG tablet TAKE 1 TABLET(5 MG) BY MOUTH AT BEDTIME AS NEEDED FOR SLEEP 90 tablet 1   No current facility-administered medications on file prior to visit.        ROS:  All others reviewed and negative.  Objective        PE:  BP 120/72 (BP Location: Right Arm, Patient Position: Sitting, Cuff Size: Normal)   Pulse (!) 57   Temp 97.8 F (36.6 C) (Oral)   Ht 6\' 2"  (1.88 m)   Wt 191 lb (86.6 kg)   SpO2 99%   BMI 24.52 kg/m                 Constitutional: Pt appears in NAD               HENT: Head: NCAT.                Right Ear: External ear normal.                 Left Ear: External ear normal.                Eyes: . Pupils are equal, round, and reactive to light. Conjunctivae and EOM are normal               Nose: without d/c or deformity                Neck: Neck supple. Gross normal ROM               Cardiovascular: Normal rate and regular rhythm.                 Pulmonary/Chest: Effort normal and breath sounds without rales or wheezing.                Abd:  Soft, NT, ND, + BS, no organomegaly               Neurological: Pt is alert. At baseline orientation, motor grossly intact               Skin: Skin is warm. No rashes, no other new lesions, LE edema - none               Psychiatric: Pt behavior is normal without agitation   Micro: none  Cardiac tracings I have personally interpreted today:  none  Pertinent Radiological findings (summarize): none   Lab Results  Component Value Date   WBC 8.9 09/05/2023   HGB 13.8 09/05/2023   HCT 41.9 09/05/2023   PLT 235.0 09/05/2023   GLUCOSE 100 (H) 09/06/2022   CHOL 121 03/13/2023   TRIG 38.0 03/13/2023   HDL 61.20 03/13/2023   LDLCALC 52 03/13/2023   ALT 16 03/13/2023   AST 19 03/13/2023   NA 140 09/06/2022   K 4.4 09/06/2022   CL 103 09/06/2022   CREATININE 1.15 09/06/2022   BUN 23 09/06/2022   CO2 29 09/06/2022   TSH 0.78 03/13/2023   PSA 2.68 03/13/2023   HGBA1C 6.0 03/13/2023   Assessment/Plan:  Oscar Dixon is a 77 y.o. White or Caucasian [1] male with  has a past medical history of ADD (07/09/2008), Allergy, Arthritis, CARPAL TUNNEL SYNDROME, BILATERAL (12/15/2009), Cataract, DYSPNEA ON EXERTION (12/15/2009), Erectile dysfunction (04/12/2012), FATIGUE (03/27/2010), GERD (05/20/2007), GOUT (03/27/2010), HAND PAIN (12/15/2009), Headache(784.0) (08/25/2009), HERNIA, UMBILICAL (12/15/2009), HYPERLIPIDEMIA (07/09/2008), HYPERTENSION (05/20/2007), LIBIDO, DECREASED (03/27/2010), Mitral regurgitation (09/12/2011), and Other specified forms of hearing loss (  06/30/2010).  Vitamin D deficiency Last vitamin D Lab Results  Component Value Date   VD25OH 72.85 03/13/2023   Stable, cont oral replacement   Hyperlipidemia Lab Results  Component Value Date   LDLCALC  52 03/13/2023   Stable, pt to continue current statin crestor 20 qd   Hyperglycemia Lab Results  Component Value Date   HGBA1C 6.0 03/13/2023   Stable, pt to continue current medical treatment  - diet, wt control   Essential hypertension BP Readings from Last 3 Encounters:  09/17/23 120/72  04/23/23 122/78  03/13/23 124/76   Stable, pt to continue medical treatment norvasc 10 every day, lotensin 40 qd   CKD (chronic kidney disease) stage 3, GFR 30-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  Oliver Barre, MD 09/18/2023 9:30 PM [facility] - [facility]

## 2023-09-17 NOTE — Patient Instructions (Signed)
You had the Prevnar 20 pneumonia shot today  Please consider having the RSV vaccination at the pharmacy  Please continue all other medications as before, and refills have been done if requested.  Please have the pharmacy call with any other refills you may need.  Please continue your efforts at being more active, low cholesterol diet, and weight control..  Please keep your appointments with your specialists as you may have planned  Please make an Appointment to return in 6 months, or sooner if needed, also with Lab Appointment for testing done 3-5 days before at the FIRST FLOOR Lab (so this is for TWO appointments - please see the scheduling desk as you leave)

## 2023-09-18 ENCOUNTER — Encounter: Payer: Self-pay | Admitting: Internal Medicine

## 2023-09-18 NOTE — Assessment & Plan Note (Signed)
Lab Results  Component Value Date   CREATININE 1.15 09/06/2022   Stable overall, cont to avoid nephrotoxins  

## 2023-09-18 NOTE — Assessment & Plan Note (Signed)
Mild to mod, for nasacort asd,  to f/u any worsening symptoms or concerns 

## 2023-09-18 NOTE — Assessment & Plan Note (Signed)
BP Readings from Last 3 Encounters:  09/17/23 120/72  04/23/23 122/78  03/13/23 124/76   Stable, pt to continue medical treatment norvasc 10 every day, lotensin 40 qd

## 2023-09-18 NOTE — Assessment & Plan Note (Signed)
Last vitamin D Lab Results  Component Value Date   VD25OH 72.85 03/13/2023   Stable, cont oral replacement

## 2023-09-18 NOTE — Assessment & Plan Note (Signed)
Lab Results  Component Value Date   LDLCALC 52 03/13/2023   Stable, pt to continue current statin crestor 20 qd

## 2023-09-18 NOTE — Assessment & Plan Note (Signed)
Lab Results  Component Value Date   HGBA1C 6.0 03/13/2023   Stable, pt to continue current medical treatment diet, wt control

## 2023-09-23 ENCOUNTER — Other Ambulatory Visit: Payer: Self-pay | Admitting: Internal Medicine

## 2023-09-26 ENCOUNTER — Encounter: Payer: Self-pay | Admitting: Internal Medicine

## 2023-10-01 IMAGING — US US FNA BIOPSY THYROID 1ST LESION
1 series · 13 of 14 positions shown · non-contrast
Comparison: US Thyroid, 03/05/2022.

MEDICATIONS:
None

COMPLICATIONS:
None immediate.

INDICATION: Indeterminate thyroid nodule

EXAM:
ULTRASOUND GUIDED FINE NEEDLE ASPIRATION OF INDETERMINATE LEFT
THYROID NODULE
TECHNIQUE: Informed written consent was obtained from the patient after a
discussion of the risks, benefits and alternatives to treatment.
Questions regarding the procedure were encouraged and answered. A
timeout was performed prior to the initiation of the procedure.

[Series 1: us fna biopsy thyroid 1st lesion · 0.06mm/px · 14 acquisitions, 13 frames shown]
[im 1/14]
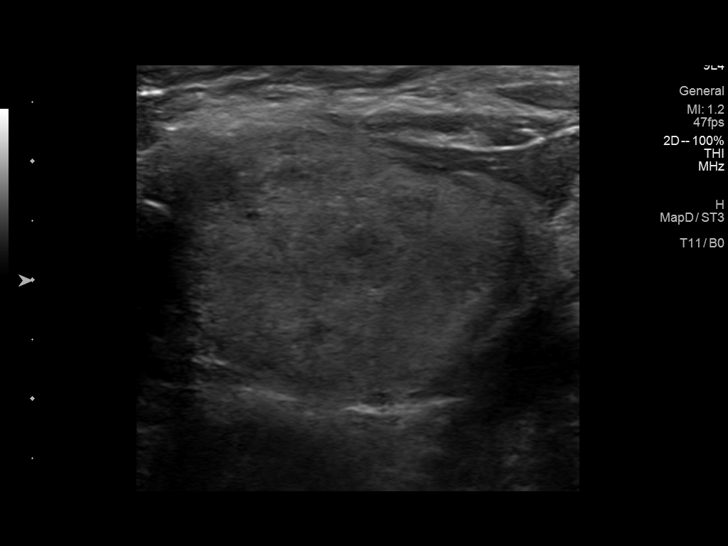
[im 2/14]
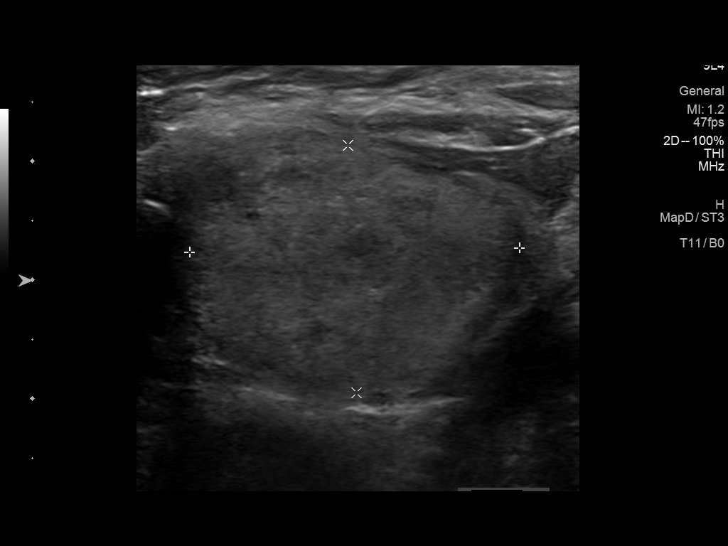
[im 3/14]
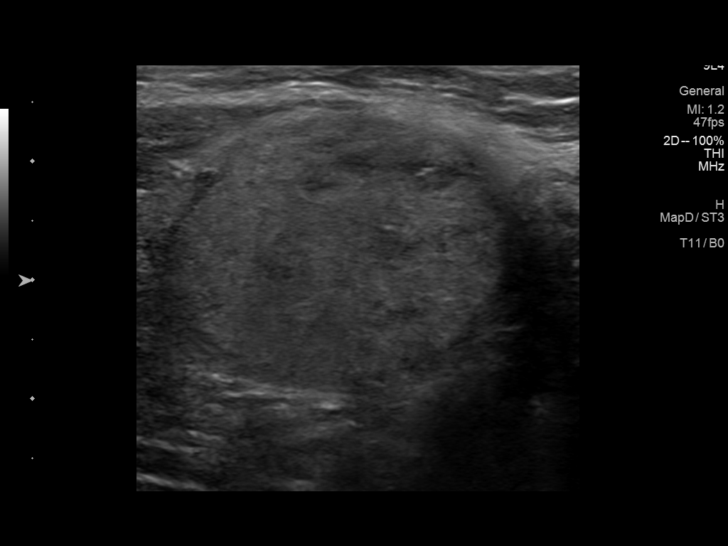
[im 4/14]
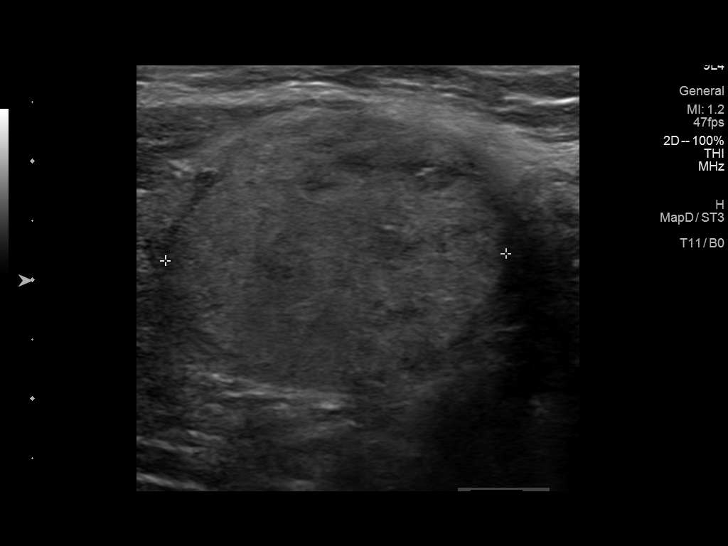
[im 5/14]
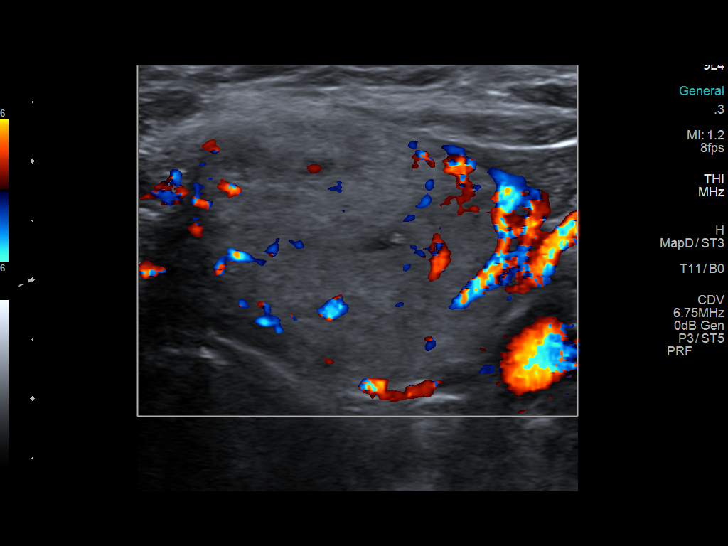
[im 6/14]
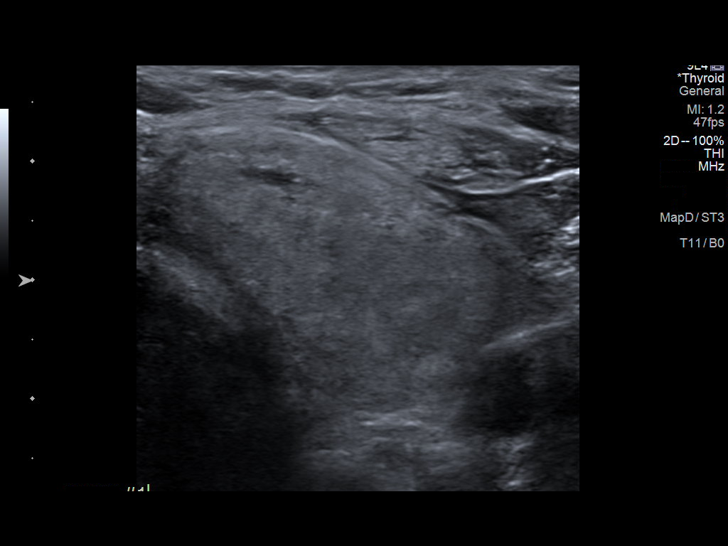
[im 8/14]
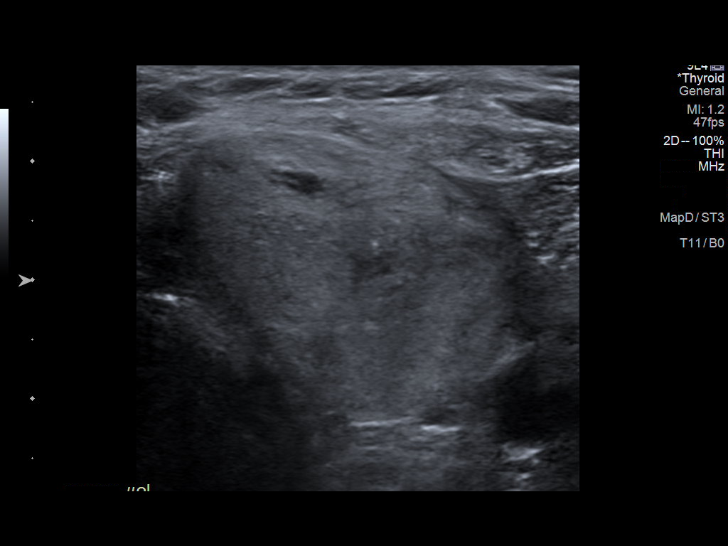
[im 9/14]
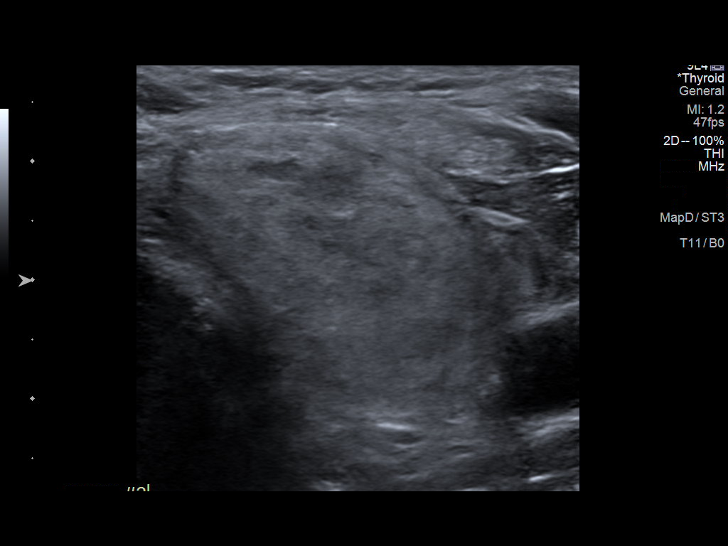
[im 10/14]
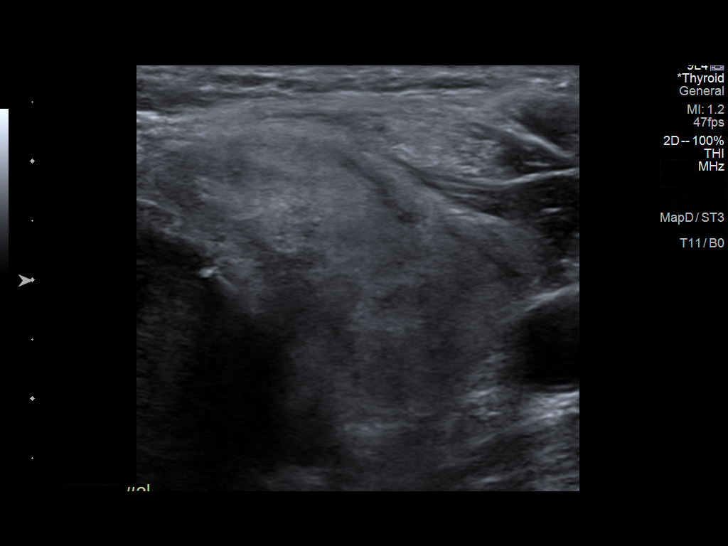
[im 11/14]
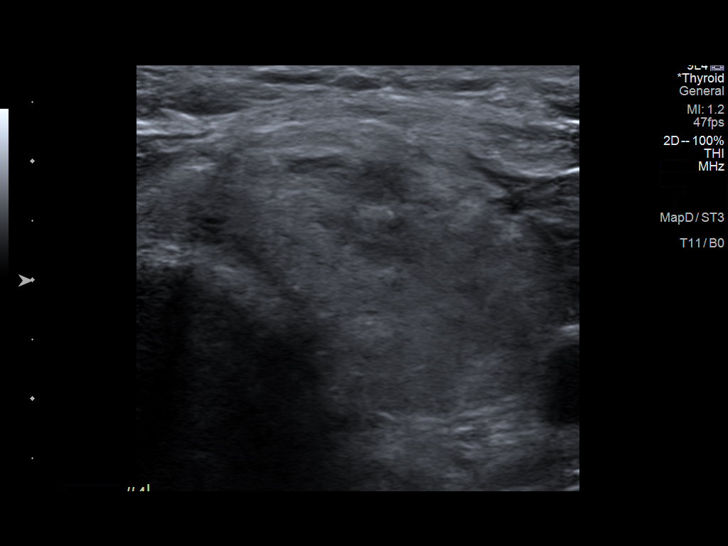
[im 12/14]
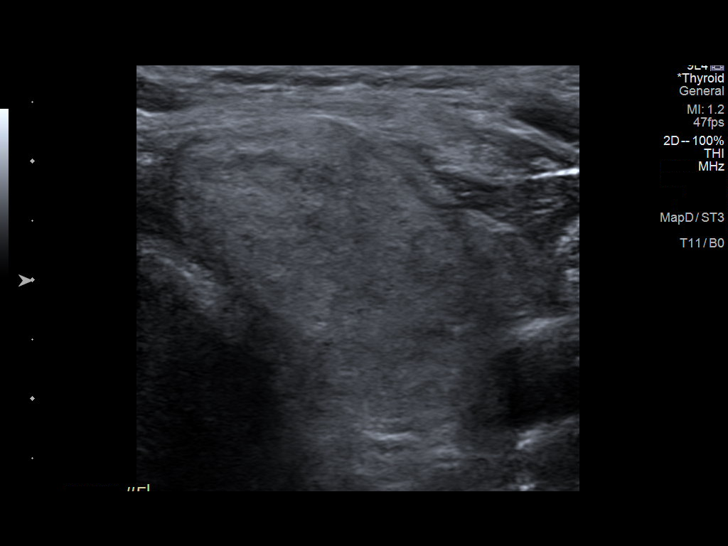
[im 13/14]
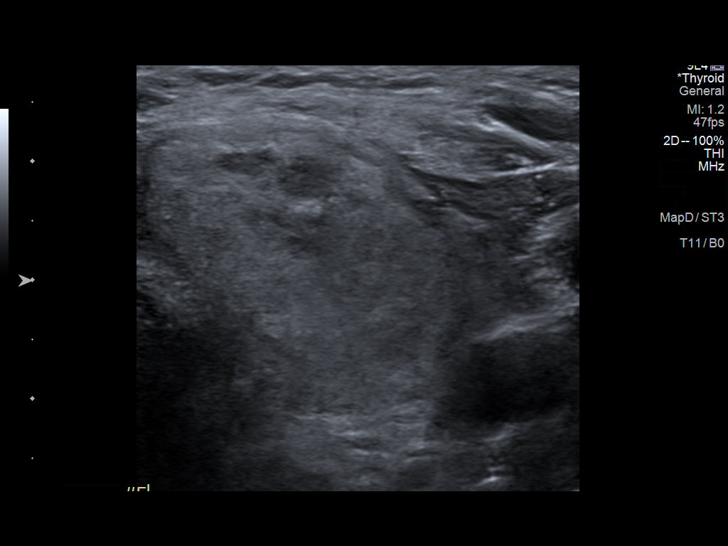
[im 14/14]
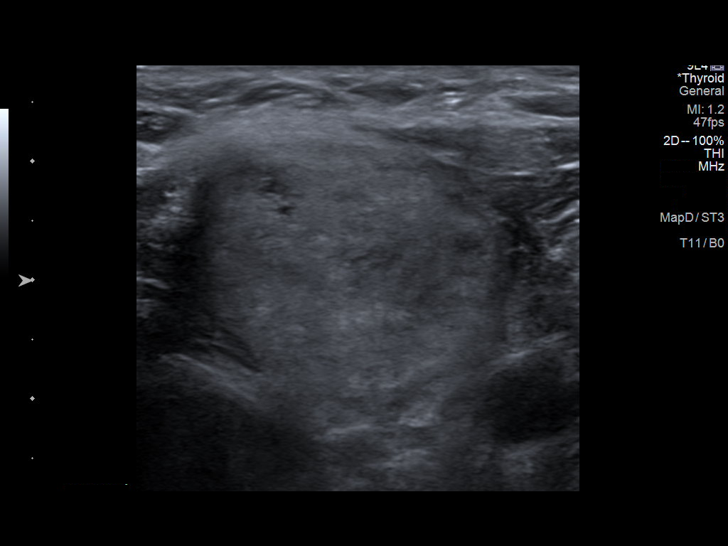

[13 of 14 positions shown; findings below may reference images not displayed]

Pre-procedural ultrasound scanning demonstrated unchanged size and
appearance of the indeterminate nodule within the LEFT thyroid gland

The procedure was planned. The neck was prepped in the usual sterile
fashion, and a sterile drape was applied covering the operative
field. A timeout was performed prior to the initiation of the
procedure. Local anesthesia was provided with 1% lidocaine.

Under direct ultrasound guidance, 5 FNA biopsies were performed of
the LEFT thyroid nodule with a 21 gauge needle. Multiple ultrasound
images were saved for procedural documentation purposes. The samples
were prepared and submitted to pathology, including Afirma for
molecular analysis.

Limited post procedural scanning was negative for hematoma or
additional complication. Dressings were placed. The patient
tolerated the above procedures procedure well without immediate
postprocedural complication.
FINDINGS: Nodule reference number based on prior diagnostic ultrasound: 2

Maximum size: 3.1 cm

Location: LEFT; Inferior

ACR TI-RADS risk category: TR3 (3 points)

Reason for biopsy: meets ACR TI-RADS criteria

Ultrasound imaging confirms appropriate placement of the needles
within the thyroid nodule.
IMPRESSION: Successful ultrasound guided FNA biopsy of 3.1 cm LEFT inferior TR-3
thyroid nodule, as above.

## 2023-11-10 ENCOUNTER — Other Ambulatory Visit: Payer: Self-pay | Admitting: Internal Medicine

## 2023-11-11 ENCOUNTER — Other Ambulatory Visit: Payer: Self-pay

## 2023-11-21 DIAGNOSIS — Z961 Presence of intraocular lens: Secondary | ICD-10-CM | POA: Diagnosis not present

## 2023-11-21 DIAGNOSIS — H52203 Unspecified astigmatism, bilateral: Secondary | ICD-10-CM | POA: Diagnosis not present

## 2023-12-16 ENCOUNTER — Encounter: Payer: Self-pay | Admitting: Internal Medicine

## 2023-12-16 ENCOUNTER — Other Ambulatory Visit: Payer: Self-pay

## 2023-12-16 MED ORDER — ESOMEPRAZOLE MAGNESIUM 40 MG PO CPDR: 40.0000 mg | DELAYED_RELEASE_CAPSULE | Freq: Every day | ORAL | 2 refills | Status: DC

## 2024-01-01 ENCOUNTER — Encounter: Payer: Self-pay | Admitting: Internal Medicine

## 2024-01-01 MED ORDER — HYDROCODONE BIT-HOMATROP MBR 5-1.5 MG/5ML PO SOLN: 5.0000 mL | Freq: Four times a day (QID) | ORAL | 0 refills | Status: AC | PRN

## 2024-01-03 ENCOUNTER — Telehealth (INDEPENDENT_AMBULATORY_CARE_PROVIDER_SITE_OTHER): Payer: Self-pay | Admitting: Otolaryngology

## 2024-01-03 NOTE — Telephone Encounter (Signed)
 Confirmed appt & location [id number]

## 2024-01-06 ENCOUNTER — Ambulatory Visit (INDEPENDENT_AMBULATORY_CARE_PROVIDER_SITE_OTHER): Payer: Medicare Other | Admitting: Otolaryngology

## 2024-01-06 ENCOUNTER — Encounter (INDEPENDENT_AMBULATORY_CARE_PROVIDER_SITE_OTHER): Payer: Self-pay

## 2024-01-06 VITALS — BP 142/80 | HR 70 | Ht 74.0 in | Wt 202.0 lb

## 2024-01-06 DIAGNOSIS — H6123 Impacted cerumen, bilateral: Secondary | ICD-10-CM

## 2024-01-08 ENCOUNTER — Encounter: Payer: Self-pay | Admitting: Internal Medicine

## 2024-01-08 DIAGNOSIS — H6123 Impacted cerumen, bilateral: Secondary | ICD-10-CM | POA: Insufficient documentation

## 2024-01-08 MED ORDER — HYDROCHLOROTHIAZIDE 12.5 MG PO CAPS: 12.5000 mg | ORAL_CAPSULE | Freq: Every day | ORAL | 3 refills | Status: DC

## 2024-01-08 NOTE — Progress Notes (Signed)
 Patient ID: Oscar Dixon, male   DOB: 1946-10-03, 78 y.o.   MRN: [id number]  Follow-up: Recurrent cerumen impaction  Procedure: Bilateral cerumen disimpaction.   Indication: Cerumen impaction, resulting in ear discomfort and conductive hearing loss.   Description: The patient is placed supine on the operating table. Under the operating microscope, the right ear canal is examined and is noted to be impacted with cerumen. The cerumen is carefully removed with a combination of suction catheters, cerumen curette, and alligator forceps. After the cerumen removal, the ear canal and tympanic membrane are noted to be normal. No middle ear effusion is noted. The same procedure is then repeated on the left side without exception. The patient tolerated the procedure well.  Follow-up care:  The patient is instructed not to use Q-tips to clean the ear canals. The patient will follow up as needed.

## 2024-01-16 ENCOUNTER — Encounter: Payer: Self-pay | Admitting: Internal Medicine

## 2024-01-16 ENCOUNTER — Other Ambulatory Visit

## 2024-01-16 DIAGNOSIS — E538 Deficiency of other specified B group vitamins: Secondary | ICD-10-CM

## 2024-01-16 DIAGNOSIS — E782 Mixed hyperlipidemia: Secondary | ICD-10-CM | POA: Diagnosis not present

## 2024-01-16 DIAGNOSIS — R972 Elevated prostate specific antigen [PSA]: Secondary | ICD-10-CM

## 2024-01-16 DIAGNOSIS — D509 Iron deficiency anemia, unspecified: Secondary | ICD-10-CM | POA: Diagnosis not present

## 2024-01-16 DIAGNOSIS — Z125 Encounter for screening for malignant neoplasm of prostate: Secondary | ICD-10-CM | POA: Diagnosis not present

## 2024-01-16 DIAGNOSIS — E559 Vitamin D deficiency, unspecified: Secondary | ICD-10-CM | POA: Diagnosis not present

## 2024-01-16 DIAGNOSIS — R739 Hyperglycemia, unspecified: Secondary | ICD-10-CM | POA: Diagnosis not present

## 2024-01-16 LAB — CBC WITH DIFFERENTIAL/PLATELET
Basophils Absolute: 0.1 10*3/uL (ref 0.0–0.1)
Basophils Relative: 1.4 % (ref 0.0–3.0)
Eosinophils Absolute: 0.4 10*3/uL (ref 0.0–0.7)
Eosinophils Relative: 4.1 % (ref 0.0–5.0)
HCT: 44.8 % (ref 39.0–52.0)
Hemoglobin: 15.1 g/dL (ref 13.0–17.0)
Lymphocytes Relative: 21.5 % (ref 12.0–46.0)
Lymphs Abs: 2 10*3/uL (ref 0.7–4.0)
MCHC: 33.6 g/dL (ref 30.0–36.0)
MCV: 90.1 fl (ref 78.0–100.0)
Monocytes Absolute: 0.8 10*3/uL (ref 0.1–1.0)
Monocytes Relative: 8 % (ref 3.0–12.0)
Neutro Abs: 6.2 10*3/uL (ref 1.4–7.7)
Neutrophils Relative %: 65 % (ref 43.0–77.0)
Platelets: 236 10*3/uL (ref 150.0–400.0)
RBC: 4.97 Mil/uL (ref 4.22–5.81)
RDW: 12.9 % (ref 11.5–15.5)
WBC: 9.5 10*3/uL (ref 4.0–10.5)

## 2024-01-16 LAB — HEPATIC FUNCTION PANEL
ALT: 16 U/L (ref 0–53)
AST: 19 U/L (ref 0–37)
Albumin: 4.9 g/dL (ref 3.5–5.2)
Alkaline Phosphatase: 37 U/L — ABNORMAL LOW (ref 39–117)
Bilirubin, Direct: 0.2 mg/dL (ref 0.0–0.3)
Total Bilirubin: 0.8 mg/dL (ref 0.2–1.2)
Total Protein: 8 g/dL (ref 6.0–8.3)

## 2024-01-16 LAB — URINALYSIS, ROUTINE W REFLEX MICROSCOPIC
Bilirubin Urine: NEGATIVE
Hgb urine dipstick: NEGATIVE
Ketones, ur: NEGATIVE
Leukocytes,Ua: NEGATIVE
Nitrite: NEGATIVE
RBC / HPF: NONE SEEN (ref 0–?)
Specific Gravity, Urine: 1.025 (ref 1.000–1.030)
Total Protein, Urine: NEGATIVE
Urine Glucose: NEGATIVE
Urobilinogen, UA: 0.2 (ref 0.0–1.0)
WBC, UA: NONE SEEN (ref 0–?)
pH: 6 (ref 5.0–8.0)

## 2024-01-16 LAB — LIPID PANEL
Cholesterol: 140 mg/dL (ref 0–200)
HDL: 68.4 mg/dL (ref >39.00)
LDL Cholesterol: 58 mg/dL (ref 0–99)
NonHDL: 71.77
Total CHOL/HDL Ratio: 2
Triglycerides: 68 mg/dL (ref 0.0–149.0)
VLDL: 13.6 mg/dL (ref 0.0–40.0)

## 2024-01-16 LAB — MICROALBUMIN / CREATININE URINE RATIO
Creatinine,U: 176.5 mg/dL
Microalb Creat Ratio: 9.4 mg/g (ref 0.0–30.0)
Microalb, Ur: 1.7 mg/dL (ref 0.0–1.9)

## 2024-01-16 LAB — PSA: PSA: 4.54 ng/mL — ABNORMAL HIGH (ref 0.10–4.00)

## 2024-01-16 LAB — BASIC METABOLIC PANEL
BUN: 26 mg/dL — ABNORMAL HIGH (ref 6–23)
CO2: 23 meq/L (ref 19–32)
Calcium: 10 mg/dL (ref 8.4–10.5)
Chloride: 100 meq/L (ref 96–112)
Creatinine, Ser: 1.28 mg/dL (ref 0.40–1.50)
GFR: 54.83 mL/min — ABNORMAL LOW (ref >60.00)
Glucose, Bld: 104 mg/dL — ABNORMAL HIGH (ref 70–99)
Potassium: 4.1 meq/L (ref 3.5–5.1)
Sodium: 137 meq/L (ref 135–145)

## 2024-01-16 LAB — VITAMIN D 25 HYDROXY (VIT D DEFICIENCY, FRACTURES): VITD: 54.12 ng/mL (ref 30.00–100.00)

## 2024-01-16 LAB — HEMOGLOBIN A1C: Hgb A1c MFr Bld: 6.1 % (ref 4.6–6.5)

## 2024-01-16 LAB — TSH: TSH: 1.41 u[IU]/mL (ref 0.35–5.50)

## 2024-01-16 LAB — VITAMIN B12: Vitamin B-12: 294 pg/mL (ref 211–911)

## 2024-01-17 ENCOUNTER — Other Ambulatory Visit: Payer: Self-pay | Admitting: Internal Medicine

## 2024-02-26 ENCOUNTER — Other Ambulatory Visit: Payer: Self-pay

## 2024-02-26 ENCOUNTER — Other Ambulatory Visit: Payer: Self-pay | Admitting: Internal Medicine

## 2024-02-26 DIAGNOSIS — R3912 Poor urinary stream: Secondary | ICD-10-CM | POA: Diagnosis not present

## 2024-03-06 ENCOUNTER — Encounter: Payer: Self-pay | Admitting: Internal Medicine

## 2024-03-06 MED ORDER — CLONAZEPAM 0.5 MG PO TABS: 0.5000 mg | ORAL_TABLET | Freq: Two times a day (BID) | ORAL | 2 refills | Status: DC | PRN

## 2024-03-23 ENCOUNTER — Other Ambulatory Visit: Payer: Self-pay | Admitting: Internal Medicine

## 2024-03-23 DIAGNOSIS — L821 Other seborrheic keratosis: Secondary | ICD-10-CM | POA: Diagnosis not present

## 2024-03-23 DIAGNOSIS — L309 Dermatitis, unspecified: Secondary | ICD-10-CM | POA: Diagnosis not present

## 2024-03-23 DIAGNOSIS — D692 Other nonthrombocytopenic purpura: Secondary | ICD-10-CM | POA: Diagnosis not present

## 2024-04-16 ENCOUNTER — Encounter: Payer: Self-pay | Admitting: Internal Medicine

## 2024-04-21 ENCOUNTER — Encounter: Payer: Self-pay | Admitting: Internal Medicine

## 2024-04-21 ENCOUNTER — Ambulatory Visit (INDEPENDENT_AMBULATORY_CARE_PROVIDER_SITE_OTHER): Payer: Medicare Other | Admitting: Internal Medicine

## 2024-04-21 ENCOUNTER — Ambulatory Visit: Payer: Medicare Other | Admitting: Internal Medicine

## 2024-04-21 VITALS — BP 124/78 | HR 64 | Temp 97.8°F | Ht 74.0 in | Wt 188.0 lb

## 2024-04-21 DIAGNOSIS — Z0000 Encounter for general adult medical examination without abnormal findings: Secondary | ICD-10-CM

## 2024-04-21 DIAGNOSIS — R739 Hyperglycemia, unspecified: Secondary | ICD-10-CM | POA: Diagnosis not present

## 2024-04-21 DIAGNOSIS — E782 Mixed hyperlipidemia: Secondary | ICD-10-CM

## 2024-04-21 DIAGNOSIS — E559 Vitamin D deficiency, unspecified: Secondary | ICD-10-CM

## 2024-04-21 DIAGNOSIS — I10 Essential (primary) hypertension: Secondary | ICD-10-CM | POA: Diagnosis not present

## 2024-04-21 DIAGNOSIS — Z0001 Encounter for general adult medical examination with abnormal findings: Secondary | ICD-10-CM

## 2024-04-21 NOTE — Progress Notes (Unsigned)
 Patient ID: Oscar Dixon, male   DOB: February 27, 1946, 78 y.o.   MRN: [id number]         Chief Complaint:: wellness exam and low vit d, hld, hyperglycemia, htn       HPI:  Oscar Dixon is a 78 y.o. male here for wellness exam; has colonoscopy scheduled soon; o/w up to date                        Also eating less calories and some wt loss.  Saw urology wit elevated psa, with fu testing normal, no eval or tx needed  Pt denies chest pain, increased sob or doe, wheezing, orthopnea, PND, increased LE swelling, palpitations, dizziness or syncope.   Pt denies polydipsia, polyuria, or new focal neuro s/s.    Pt denies fever, wt loss, night sweats, loss of appetite, or other constitutional symptoms    Wt Readings from Last 3 Encounters:  04/21/24 188 lb (85.3 kg)  01/06/24 202 lb (91.6 kg)  09/17/23 191 lb (86.6 kg)   BP Readings from Last 3 Encounters:  04/21/24 124/78  01/06/24 (!) 142/80  09/17/23 120/72   Immunization History  Administered Date(s) Administered   Fluad Quad(high Dose 65+) 06/14/2023   Influenza Split 09/18/2012, 07/30/2022   Influenza Whole 06/30/2010   Influenza, High Dose Seasonal PF 07/26/2014, 07/19/2015, 07/20/2015, 06/15/2019   Influenza,inj,Quad PF,6+ Mos 09/18/2013   Influenza-Unspecified 07/23/2017, 08/05/2018, 07/11/2021   PFIZER(Purple Top)SARS-COV-2 Vaccination 12/11/2019, 01/05/2020, 08/04/2020   PNEUMOCOCCAL CONJUGATE-20 09/17/2023   Pfizer Covid-19 Vaccine Bivalent Booster 33yrs & up 08/01/2021   Pneumococcal Conjugate-13 10/27/2013   Pneumococcal Polysaccharide-23 09/22/2014   Td 05/06/2006   Tdap 10/02/2016   Zoster Recombinant(Shingrix) 08/13/2017, 12/26/2017, 02/19/2022   Zoster, Live 06/24/2009   Health Maintenance Due  Topic Date Due   Colonoscopy  03/28/2024      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   Cataract    bilateral sx    DYSPNEA ON EXERTION 12/15/2009   Erectile dysfunction 04/12/2012   FATIGUE 03/27/2010   GERD 05/20/2007   on meds   GOUT 03/27/2010   HAND PAIN 12/15/2009   Headache(784.0) 08/25/2009   HERNIA, UMBILICAL 12/15/2009   HYPERLIPIDEMIA 07/09/2008   on meds   HYPERTENSION 05/20/2007   on meds   LIBIDO, DECREASED 03/27/2010   Mitral regurgitation 09/12/2011   Other specified forms of hearing loss 06/30/2010   Past Surgical History:  Procedure Laterality Date   ANKLE SURGERY Left 1967   CATARACT EXTRACTION Bilateral    COLONOSCOPY  2016   TA's -suprep (exc)   ELBOW SURGERY Right 1970   POLYPECTOMY  2016   TA's    s/p basal cell cancer     UMBILICAL HERNIA REPAIR     UPPER GASTROINTESTINAL ENDOSCOPY      reports that he quit smoking about 37 years ago. His smoking use included cigarettes. He started smoking about 47 years ago. He has never used smokeless tobacco. He reports current alcohol use of about 7.0 standard drinks of alcohol per week. He reports that he does not use drugs. family history includes Alcohol abuse in his mother; Hypertension in his father and mother; Lung cancer (age of onset: 73) in his brother; Stroke (age of onset: 67) in his father. Allergies  Allergen Reactions   Amoxicillin  Other (See Comments)  Augmentin  [Amoxicillin -Pot Clavulanate] Diarrhea    diarrhea   Current Outpatient Medications on File Prior to Visit  Medication Sig Dispense Refill   amLODipine  (NORVASC ) 10 MG tablet TAKE 1 TABLET(10 MG) BY MOUTH DAILY 90 tablet 3   aspirin 81 MG EC tablet Take 81 mg by mouth daily.     benazepril  (LOTENSIN ) 40 MG tablet TAKE 1 TABLET(40 MG) BY MOUTH DAILY 90 tablet 3   Cholecalciferol (VITAMIN D3 PO) Take 2,000 Units by mouth daily.     clobetasol ointment (TEMOVATE) 0.05 % 1 Application.     clonazePAM  (KLONOPIN ) 0.5 MG tablet Take 1 tablet (0.5 mg total) by mouth 2 (two) times daily as needed for anxiety. 60 tablet 2   esomeprazole  (NEXIUM ) 40 MG capsule  Take 1 capsule (40 mg total) by mouth daily at 12 noon. 90 capsule 2   hydrochlorothiazide  (MICROZIDE ) 12.5 MG capsule Take 1 capsule (12.5 mg total) by mouth daily. 90 capsule 3   rosuvastatin  (CRESTOR ) 20 MG tablet TAKE 1 TABLET(20 MG) BY MOUTH DAILY 90 tablet 3   zolpidem  (AMBIEN ) 5 MG tablet TAKE 1 TABLET(5 MG) BY MOUTH AT BEDTIME AS NEEDED FOR SLEEP 90 tablet 1   No current facility-administered medications on file prior to visit.        ROS:  All others reviewed and negative.  Objective        PE:  BP 124/78 (BP Location: Right Arm, Patient Position: Sitting, Cuff Size: Normal)   Pulse 64   Temp 97.8 F (36.6 C) (Oral)   Ht 6' 2 (1.88 m)   Wt 188 lb (85.3 kg)   SpO2 97%   BMI 24.14 kg/m                 Constitutional: Pt appears in NAD               HENT: Head: NCAT.                Right Ear: External ear normal.                 Left Ear: External ear normal.                Eyes: . Pupils are equal, round, and reactive to light. Conjunctivae and EOM are normal               Nose: without d/c or deformity               Neck: Neck supple. Gross normal ROM               Cardiovascular: Normal rate and regular rhythm.                 Pulmonary/Chest: Effort normal and breath sounds without rales or wheezing.                Abd:  Soft, NT, ND, + BS, no organomegaly               Neurological: Pt is alert. At baseline orientation, motor grossly intact               Skin: Skin is warm. No rashes, no other new lesions, LE edema - none               Psychiatric: Pt behavior is normal without agitation   Micro: none  Cardiac tracings I have personally interpreted today:  none  Pertinent Radiological findings (summarize): none  Lab Results  Component Value Date   WBC 9.5 01/16/2024   HGB 15.1 01/16/2024   HCT 44.8 01/16/2024   PLT 236.0 01/16/2024   GLUCOSE 104 (H) 01/16/2024   CHOL 140 01/16/2024   TRIG 68.0 01/16/2024   HDL 68.40 01/16/2024   LDLCALC 58 01/16/2024    ALT 16 01/16/2024   AST 19 01/16/2024   NA 137 01/16/2024   K 4.1 01/16/2024   CL 100 01/16/2024   CREATININE 1.28 01/16/2024   BUN 26 (H) 01/16/2024   CO2 23 01/16/2024   TSH 1.41 01/16/2024   PSA 4.54 (H) 01/16/2024   HGBA1C 6.1 01/16/2024   MICROALBUR 1.7 01/16/2024   Assessment/Plan:  Oscar Dixon is a 78 y.o. White or Caucasian [1] male with  has a past medical history of ADD (07/09/2008), Allergy, Arthritis, CARPAL TUNNEL SYNDROME, BILATERAL (12/15/2009), Cataract, DYSPNEA ON EXERTION (12/15/2009), Erectile dysfunction (04/12/2012), FATIGUE (03/27/2010), GERD (05/20/2007), GOUT (03/27/2010), HAND PAIN (12/15/2009), Headache(784.0) (08/25/2009), HERNIA, UMBILICAL (12/15/2009), HYPERLIPIDEMIA (07/09/2008), HYPERTENSION (05/20/2007), LIBIDO, DECREASED (03/27/2010), Mitral regurgitation (09/12/2011), and Other specified forms of hearing loss (06/30/2010).  Encounter for well adult exam with abnormal findings Age and sex appropriate education and counseling updated with regular exercise and diet Referrals for preventative services - for colonoscopy soon Immunizations addressed - none needed Smoking counseling  - none needed Evidence for depression or other mood disorder - none significant Most recent labs reviewed. I have personally reviewed and have noted: 1) the patient's medical and social history 2) The patient's current medications and supplements 3) The patient's height, weight, and BMI have been recorded in the chart   Vitamin D  deficiency Last vitamin D  Lab Results  Component Value Date   VD25OH 54.12 01/16/2024   Stable, cont oral replacement   Hyperlipidemia Lab Results  Component Value Date   LDLCALC 58 01/16/2024   Stable, pt to continue current statin crestor  20 qd   Hyperglycemia Lab Results  Component Value Date   HGBA1C 6.1 01/16/2024   Stable, pt to continue current medical treatment  - diet, wt control   Essential hypertension BP Readings  from Last 3 Encounters:  04/21/24 124/78  01/06/24 (!) 142/80  09/17/23 120/72   Stable, pt to continue medical treatment norvasc  10 every day, lotensin  40 every day, hct 12.5 qd  Followup: Return in about 6 months (around 10/21/2024).  Rosalia Colonel, MD 04/23/2024 9:13 PM [facility] [place] [facility] - [facility]

## 2024-04-21 NOTE — Patient Instructions (Signed)
 Please continue all other medications as before, and refills have been done if requested.  Please have the pharmacy call with any other refills you may need.  Please continue your efforts at being more active, low cholesterol diet, and weight control.  You are otherwise up to date with prevention measures today.  Please keep your appointments with your specialists as you may have planned  Please make an Appointment to return in 6 months, or sooner if needed

## 2024-04-23 ENCOUNTER — Encounter: Payer: Self-pay | Admitting: Internal Medicine

## 2024-04-23 NOTE — Assessment & Plan Note (Signed)
 Lab Results  Component Value Date   HGBA1C 6.1 01/16/2024   Stable, pt to continue current medical treatment  - diet, wt control

## 2024-04-23 NOTE — Assessment & Plan Note (Signed)
 Lab Results  Component Value Date   LDLCALC 58 01/16/2024   Stable, pt to continue current statin crestor  20 qd

## 2024-04-23 NOTE — Assessment & Plan Note (Signed)
 Last vitamin D  Lab Results  Component Value Date   VD25OH 54.12 01/16/2024   Stable, cont oral replacement

## 2024-04-23 NOTE — Assessment & Plan Note (Signed)
Age and sex appropriate education and counseling updated with regular exercise and diet Referrals for preventative services - for colonoscopy soon Immunizations addressed - none needed Smoking counseling  - none needed Evidence for depression or other mood disorder - none significant Most recent labs reviewed. I have personally reviewed and have noted: 1) the patient's medical and social history 2) The patient's current medications and supplements 3) The patient's height, weight, and BMI have been recorded in the chart

## 2024-04-23 NOTE — Assessment & Plan Note (Signed)
 BP Readings from Last 3 Encounters:  04/21/24 124/78  01/06/24 (!) 142/80  09/17/23 120/72   Stable, pt to continue medical treatment norvasc  10 every day, lotensin  40 every day, hct 12.5 qd

## 2024-05-12 ENCOUNTER — Encounter: Payer: Self-pay | Admitting: Internal Medicine

## 2024-05-13 ENCOUNTER — Telehealth: Payer: Self-pay

## 2024-05-13 DIAGNOSIS — Z1211 Encounter for screening for malignant neoplasm of colon: Secondary | ICD-10-CM

## 2024-05-13 NOTE — Telephone Encounter (Signed)
 Per pt request referral for colonoscopy

## 2024-06-12 ENCOUNTER — Other Ambulatory Visit: Payer: Self-pay | Admitting: Internal Medicine

## 2024-06-15 ENCOUNTER — Encounter: Payer: Self-pay | Admitting: Internal Medicine

## 2024-06-15 ENCOUNTER — Other Ambulatory Visit: Payer: Self-pay | Admitting: Internal Medicine

## 2024-06-17 NOTE — Telephone Encounter (Signed)
 Ok to document as pt requested and update the immunizations.   Physicians in general are not recommending further covid shots as they are ineffective at prevention and transmission, and may be causing autoimmune diseases.  thanks

## 2024-06-19 ENCOUNTER — Ambulatory Visit: Payer: Self-pay | Admitting: Internal Medicine

## 2024-06-19 ENCOUNTER — Other Ambulatory Visit (INDEPENDENT_AMBULATORY_CARE_PROVIDER_SITE_OTHER)

## 2024-06-19 DIAGNOSIS — D509 Iron deficiency anemia, unspecified: Secondary | ICD-10-CM | POA: Diagnosis not present

## 2024-06-19 LAB — CBC WITH DIFFERENTIAL/PLATELET
Basophils Absolute: 0.1 K/uL (ref 0.0–0.1)
Basophils Relative: 0.7 % (ref 0.0–3.0)
Eosinophils Absolute: 0.5 K/uL (ref 0.0–0.7)
Eosinophils Relative: 6.5 % — ABNORMAL HIGH (ref 0.0–5.0)
HCT: 41.1 % (ref 39.0–52.0)
Hemoglobin: 13.6 g/dL (ref 13.0–17.0)
Lymphocytes Relative: 23.3 % (ref 12.0–46.0)
Lymphs Abs: 1.7 K/uL (ref 0.7–4.0)
MCHC: 33.2 g/dL (ref 30.0–36.0)
MCV: 89.6 fl (ref 78.0–100.0)
Monocytes Absolute: 0.5 K/uL (ref 0.1–1.0)
Monocytes Relative: 6.9 % (ref 3.0–12.0)
Neutro Abs: 4.7 K/uL (ref 1.4–7.7)
Neutrophils Relative %: 62.6 % (ref 43.0–77.0)
Platelets: 204 K/uL (ref 150.0–400.0)
RBC: 4.59 Mil/uL (ref 4.22–5.81)
RDW: 12.5 % (ref 11.5–15.5)
WBC: 7.5 K/uL (ref 4.0–10.5)

## 2024-06-19 NOTE — Progress Notes (Signed)
 The test results show that your current treatment is OK, as the tests are stable.  Please continue the same plan.  There is no other need for change of treatment or further evaluation based on these results, at this time.  thanks

## 2024-07-21 ENCOUNTER — Encounter: Payer: Self-pay | Admitting: Gastroenterology

## 2024-07-23 ENCOUNTER — Encounter: Payer: Self-pay | Admitting: Gastroenterology

## 2024-07-27 ENCOUNTER — Other Ambulatory Visit: Payer: Self-pay | Admitting: Internal Medicine

## 2024-08-19 ENCOUNTER — Telehealth: Payer: Self-pay | Admitting: Internal Medicine

## 2024-08-19 ENCOUNTER — Encounter: Payer: Self-pay | Admitting: Internal Medicine

## 2024-08-19 DIAGNOSIS — E782 Mixed hyperlipidemia: Secondary | ICD-10-CM

## 2024-08-19 DIAGNOSIS — R739 Hyperglycemia, unspecified: Secondary | ICD-10-CM

## 2024-08-19 NOTE — Telephone Encounter (Signed)
 Received note per urology -   Watt Rush, MD  Rush Lynwood ORN, MD This fellow had a previsit UA that was brown with a high bili level.   I don't see that he has had liver issues in the past.   I will notify him of the results, but could you get him set up with LFT's.\\  Please to contact pt - needs LFTs and can check A1c and lipids as well.  I will place the orders to be done at his convenience   thanks

## 2024-08-21 NOTE — Telephone Encounter (Signed)
 Called and left voicemail letting Pt know to come by the office to get lab work done.

## 2024-08-24 ENCOUNTER — Other Ambulatory Visit (INDEPENDENT_AMBULATORY_CARE_PROVIDER_SITE_OTHER)

## 2024-08-24 ENCOUNTER — Other Ambulatory Visit: Payer: Self-pay | Admitting: Medical Genetics

## 2024-08-24 DIAGNOSIS — E782 Mixed hyperlipidemia: Secondary | ICD-10-CM | POA: Diagnosis not present

## 2024-08-24 DIAGNOSIS — R739 Hyperglycemia, unspecified: Secondary | ICD-10-CM

## 2024-08-24 LAB — CBC WITH DIFFERENTIAL/PLATELET
Basophils Absolute: 0.1 K/uL (ref 0.0–0.1)
Basophils Relative: 1.5 % (ref 0.0–3.0)
Eosinophils Absolute: 0.2 K/uL (ref 0.0–0.7)
Eosinophils Relative: 3.7 % (ref 0.0–5.0)
HCT: 40.7 % (ref 39.0–52.0)
Hemoglobin: 13.5 g/dL (ref 13.0–17.0)
Lymphocytes Relative: 31.3 % (ref 12.0–46.0)
Lymphs Abs: 2 K/uL (ref 0.7–4.0)
MCHC: 33.1 g/dL (ref 30.0–36.0)
MCV: 89.4 fl (ref 78.0–100.0)
Monocytes Absolute: 0.5 K/uL (ref 0.1–1.0)
Monocytes Relative: 7.8 % (ref 3.0–12.0)
Neutro Abs: 3.5 K/uL (ref 1.4–7.7)
Neutrophils Relative %: 55.7 % (ref 43.0–77.0)
Platelets: 208 K/uL (ref 150.0–400.0)
RBC: 4.55 Mil/uL (ref 4.22–5.81)
RDW: 13.3 % (ref 11.5–15.5)
WBC: 6.3 K/uL (ref 4.0–10.5)

## 2024-08-24 LAB — BASIC METABOLIC PANEL WITH GFR
BUN: 20 mg/dL (ref 6–23)
CO2: 27 meq/L (ref 19–32)
Calcium: 9.3 mg/dL (ref 8.4–10.5)
Chloride: 103 meq/L (ref 96–112)
Creatinine, Ser: 1.58 mg/dL — ABNORMAL HIGH (ref 0.40–1.50)
GFR: 42.41 mL/min — ABNORMAL LOW (ref >60.00)
Glucose, Bld: 99 mg/dL (ref 70–99)
Potassium: 4.3 meq/L (ref 3.5–5.1)
Sodium: 140 meq/L (ref 135–145)

## 2024-08-24 LAB — LIPID PANEL
Cholesterol: 193 mg/dL (ref 0–200)
HDL: 56.2 mg/dL (ref >39.00)
LDL Cholesterol: 122 mg/dL — ABNORMAL HIGH (ref 0–99)
NonHDL: 136.94
Total CHOL/HDL Ratio: 3
Triglycerides: 76 mg/dL (ref 0.0–149.0)
VLDL: 15.2 mg/dL (ref 0.0–40.0)

## 2024-08-24 LAB — HEPATIC FUNCTION PANEL
ALT: 16 U/L (ref 0–53)
AST: 21 U/L (ref 0–37)
Albumin: 4.2 g/dL (ref 3.5–5.2)
Alkaline Phosphatase: 29 U/L — ABNORMAL LOW (ref 39–117)
Bilirubin, Direct: 0.2 mg/dL (ref 0.0–0.3)
Total Bilirubin: 0.8 mg/dL (ref 0.2–1.2)
Total Protein: 6.8 g/dL (ref 6.0–8.3)

## 2024-08-25 ENCOUNTER — Ambulatory Visit: Payer: Self-pay | Admitting: Internal Medicine

## 2024-08-25 LAB — HEMOGLOBIN A1C: Hgb A1c MFr Bld: 6.1 % (ref 4.6–6.5)

## 2024-08-31 ENCOUNTER — Ambulatory Visit (AMBULATORY_SURGERY_CENTER)

## 2024-08-31 ENCOUNTER — Encounter: Payer: Self-pay | Admitting: Gastroenterology

## 2024-08-31 VITALS — Ht 74.0 in | Wt 186.0 lb

## 2024-08-31 DIAGNOSIS — Z860100 Personal history of colon polyps, unspecified: Secondary | ICD-10-CM

## 2024-08-31 MED ORDER — NA SULFATE-K SULFATE-MG SULF 17.5-3.13-1.6 GM/177ML PO SOLN: 1.0000 | Freq: Once | ORAL | 0 refills | Status: AC

## 2024-08-31 NOTE — Progress Notes (Signed)

## 2024-09-08 ENCOUNTER — Encounter: Payer: Self-pay | Admitting: Internal Medicine

## 2024-09-08 ENCOUNTER — Other Ambulatory Visit: Payer: Self-pay

## 2024-09-08 MED ORDER — ESOMEPRAZOLE MAGNESIUM 40 MG PO CPDR: 40.0000 mg | DELAYED_RELEASE_CAPSULE | Freq: Every day | ORAL | 2 refills | Status: DC

## 2024-09-16 ENCOUNTER — Encounter: Payer: Self-pay | Admitting: Gastroenterology

## 2024-09-16 ENCOUNTER — Ambulatory Visit (AMBULATORY_SURGERY_CENTER): Admitting: Gastroenterology

## 2024-09-16 VITALS — BP 162/99 | HR 54 | Temp 97.9°F | Resp 11 | Ht 74.0 in | Wt 186.0 lb

## 2024-09-16 DIAGNOSIS — Z860101 Personal history of adenomatous and serrated colon polyps: Secondary | ICD-10-CM

## 2024-09-16 DIAGNOSIS — K641 Second degree hemorrhoids: Secondary | ICD-10-CM | POA: Diagnosis not present

## 2024-09-16 DIAGNOSIS — Z1211 Encounter for screening for malignant neoplasm of colon: Secondary | ICD-10-CM | POA: Diagnosis not present

## 2024-09-16 DIAGNOSIS — D123 Benign neoplasm of transverse colon: Secondary | ICD-10-CM

## 2024-09-16 DIAGNOSIS — Z860100 Personal history of colon polyps, unspecified: Secondary | ICD-10-CM

## 2024-09-16 MED ORDER — SODIUM CHLORIDE 0.9 % IV SOLN: 500.0000 mL | Freq: Once | INTRAVENOUS | Status: DC

## 2024-09-16 NOTE — Op Note (Signed)
 [facility] Patient Name: Oscar Dixon Procedure Date: 09/16/2024 8:26 AM MRN: [id number] Endoscopist: Aloha Finner , MD, [id number] Age: 78 Referring MD:  Date of Birth: 1946/04/11 Gender: Male Account #: [id number] Procedure:                Colonoscopy Indications:              Surveillance: Personal history of adenomatous                            polyps on last colonoscopy 3 years ago, High risk                            colon cancer surveillance: Personal history of                            adenoma less than 10 mm in size Medicines:                Monitored Anesthesia Care Procedure:                Pre-Anesthesia Assessment:                           - Prior to the procedure, a History and Physical                            was performed, and patient medications and                            allergies were reviewed. The patient's tolerance of                            previous anesthesia was also reviewed. The risks                            and benefits of the procedure and the sedation                            options and risks were discussed with the patient.                            All questions were answered, and informed consent                            was obtained. Prior Anticoagulants: The patient has                            taken no anticoagulant or antiplatelet agents                            except for aspirin. ASA Grade Assessment: II - A                            patient with mild systemic disease. After reviewing  the risks and benefits, the patient was deemed in                            satisfactory condition to undergo the procedure.                           After obtaining informed consent, the colonoscope                            was passed under direct vision. Throughout the                            procedure, the patient's blood pressure, pulse, and                            oxygen  saturations were monitored continuously. The                            Olympus Scope [id number] was introduced through the                            anus and advanced to the 3 cm into the ileum. The                            colonoscopy was performed without difficulty. The                            patient tolerated the procedure. The quality of the                            bowel preparation was good. The terminal ileum,                            ileocecal valve, appendiceal orifice, and rectum                            were photographed. Scope In: 8:32:13 AM Scope Out: 8:40:28 AM Scope Withdrawal Time: 0 hours 6 minutes 34 seconds  Total Procedure Duration: 0 hours 8 minutes 15 seconds  Findings:                 The digital rectal exam findings include                            hemorrhoids. Pertinent negatives include no                            palpable rectal lesions.                           The terminal ileum and ileocecal valve appeared                            normal.  A 4 mm polyp was found in the transverse colon. The                            polyp was sessile. The polyp was removed with a                            saline injection-lift technique using a cold snare.                            Resection and retrieval were complete.                           Normal mucosa was found in the entire colon                            otherwise.                           Non-bleeding non-thrombosed internal hemorrhoids                            were found during retroflexion, during perianal                            exam and during digital exam. The hemorrhoids were                            Grade II (internal hemorrhoids that prolapse but                            reduce spontaneously). Complications:            No immediate complications. Estimated Blood Loss:     Estimated blood loss was minimal. Impression:               - Hemorrhoids  found on digital rectal exam.                           - The examined portion of the ileum was normal.                           - One 4 mm polyp in the transverse colon, removed                            using injection-lift and a cold snare. Resected and                            retrieved.                           - Normal mucosa in the entire examined colon                            otherwise.                           -  Non-bleeding non-thrombosed internal hemorrhoids. Recommendation:           - The patient will be observed post-procedure,                            until all discharge criteria are met.                           - Discharge patient to home.                           - Patient has a contact number available for                            emergencies. The signs and symptoms of potential                            delayed complications were discussed with the                            patient. Return to normal activities tomorrow.                            Written discharge instructions were provided to the                            patient.                           - High fiber diet.                           - Use FiberCon 1-2 tablets PO daily.                           - Continue present medications.                           - Await pathology results.                           - Repeat colonoscopy in 5-7 years for surveillance                            pending pathology and history of previous                            adenomatous colon polyps.                           - The findings and recommendations were discussed                            with the patient.                           - The findings and recommendations were discussed  with the patient's family. Aloha Finner, MD 09/16/2024 8:44:08 AM

## 2024-09-16 NOTE — Progress Notes (Signed)
 GASTROENTEROLOGY PROCEDURE H&P NOTE   Primary Care Physician: Norleen Lynwood ORN, MD  HPI: Oscar Dixon is a 78 y.o. male who presents for Colonoscopy for surveillance of previous adenomas.  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     benazepril  (LOTENSIN ) 40 MG tablet TAKE 1 TABLET(40 MG) BY MOUTH DAILY 90 tablet 3   Cholecalciferol (VITAMIN D3 PO) Take 2,000 Units by mouth daily.     clobetasol ointment (TEMOVATE) 0.05 % 1 Application.     clonazePAM  (KLONOPIN ) 0.5 MG tablet TAKE 1 TABLET(0.5 MG) BY MOUTH TWICE DAILY AS NEEDED FOR ANXIETY 60 tablet 2   esomeprazole  (NEXIUM ) 40 MG capsule Take 1 capsule (40 mg total) by mouth daily at 12 noon. 90 capsule 2   hydrochlorothiazide  (MICROZIDE ) 12.5 MG capsule Take 1 capsule (12.5 mg total) by mouth daily. 90 capsule 3   rosuvastatin  (CRESTOR ) 20 MG tablet TAKE 1 TABLET(20 MG) BY MOUTH DAILY 90  tablet 3   zolpidem  (AMBIEN ) 5 MG tablet TAKE 1 TABLET(5 MG) BY MOUTH AT BEDTIME AS NEEDED FOR SLEEP 90 tablet 1   Current Facility-Administered Medications  Medication Dose Route Frequency Provider Last Rate Last Admin   0.9 %  sodium chloride  infusion  500 mL Intravenous Once Mansouraty, Meldon Hanzlik Jr., MD        Current Outpatient Medications:    amLODipine  (NORVASC ) 10 MG tablet, TAKE 1 TABLET(10 MG) BY MOUTH DAILY, Disp: 90 tablet, Rfl: 3   aspirin 81 MG EC tablet, Take 81 mg by mouth daily., Disp: , Rfl:    benazepril  (LOTENSIN ) 40 MG tablet, TAKE 1 TABLET(40 MG) BY MOUTH DAILY, Disp: 90 tablet, Rfl: 3   Cholecalciferol (VITAMIN D3 PO), Take 2,000 Units by mouth daily., Disp: , Rfl:    clobetasol ointment (TEMOVATE) 0.05 %, 1 Application., Disp: , Rfl:    clonazePAM  (KLONOPIN ) 0.5 MG tablet, TAKE 1 TABLET(0.5 MG) BY MOUTH TWICE DAILY AS NEEDED FOR ANXIETY, Disp: 60 tablet, Rfl: 2   esomeprazole  (NEXIUM ) 40 MG capsule, Take 1 capsule (40 mg total) by mouth daily at 12 noon., Disp: 90 capsule, Rfl: 2   hydrochlorothiazide  (MICROZIDE ) 12.5 MG capsule, Take 1 capsule (12.5 mg total) by mouth daily., Disp: 90 capsule, Rfl: 3  rosuvastatin  (CRESTOR ) 20 MG tablet, TAKE 1 TABLET(20 MG) BY MOUTH DAILY, Disp: 90 tablet, Rfl: 3   zolpidem  (AMBIEN ) 5 MG tablet, TAKE 1 TABLET(5 MG) BY MOUTH AT BEDTIME AS NEEDED FOR SLEEP, Disp: 90 tablet, Rfl: 1  Current Facility-Administered Medications:    0.9 %  sodium chloride  infusion, 500 mL, Intravenous, Once, Mansouraty, Aloha Raddle., MD Allergies  Allergen Reactions   Amoxicillin  Diarrhea and Other (See Comments)   Augmentin  [Amoxicillin -Pot Clavulanate] Diarrhea    diarrhea   Family History  Problem Relation Age of Onset   Hypertension Mother    Alcohol abuse Mother        ETOH   Hypertension Father    Stroke Father 26   Lung cancer Brother 59   Colon polyps Neg Hx    Colon cancer Neg Hx    Esophageal cancer Neg Hx    Stomach cancer Neg Hx     Rectal cancer Neg Hx    Inflammatory bowel disease Neg Hx    Liver disease Neg Hx    Pancreatic cancer Neg Hx    Social History   Socioeconomic History   Marital status: Married    Spouse name: Debbie   Number of children: 3   Years of education: college   Highest education level: Bachelor's degree (e.g., BA, AB, BS)  Occupational History   Occupation: Retired/[employer]  Tobacco Use   Smoking status: Former    Current packs/day: 0.00    Types: Cigarettes    Start date: 38    Quit date: 1988    Years since quitting: 37.8   Smokeless tobacco: Never  Vaping Use   Vaping status: Never Used  Substance and Sexual Activity   Alcohol use: Yes    Alcohol/week: 7.0 standard drinks of alcohol    Types: 7 Standard drinks or equivalent per week   Drug use: Never   Sexual activity: Yes  Other Topics Concern   Not on file  Social History Narrative   Lives w/ wife   Caffeine use: 2 cups coffee every am   Right handed    Social Drivers of Health   Financial Resource Strain: Low Risk  (09/16/2023)   Overall Financial Resource Strain (CARDIA)    Difficulty of Paying Living Expenses: Not hard at all  Food Insecurity: No Food Insecurity (09/16/2023)   Hunger Vital Sign    Worried About Running Out of Food in the Last Year: Never true    Ran Out of Food in the Last Year: Never true  Transportation Needs: No Transportation Needs (09/16/2023)   PRAPARE - Administrator, Civil Service (Medical): No    Lack of Transportation (Non-Medical): No  Physical Activity: Insufficiently Active (09/16/2023)   Exercise Vital Sign    Days of Exercise per Week: 3 days    Minutes of Exercise per Session: 40 min  Stress: Stress Concern Present (09/16/2023)   [employer] of Occupational Health - Occupational Stress Questionnaire    Feeling of Stress : To some extent  Social Connections: Socially Integrated (09/16/2023)   Social Connection and Isolation Panel    Frequency of  Communication with Friends and Family: More than three times a week    Frequency of Social Gatherings with Friends and Family: Twice a week    Attends Religious Services: More than 4 times per year    Active Member of [employer] or Organizations: Yes    Attends Banker Meetings: More than 4 times per year  Marital Status: Married  Catering Manager Violence: Not At Risk (09/13/2023)   Humiliation, Afraid, Rape, and Kick questionnaire    Fear of Current or Ex-Partner: No    Emotionally Abused: No    Physically Abused: No    Sexually Abused: No    Physical Exam: Today's Vitals   09/16/24 0735  BP: 132/85  Pulse: 63  Temp: 97.9 F (36.6 C)  TempSrc: Skin  SpO2: 94%  Weight: 186 lb (84.4 kg)  Height: 6' 2 (1.88 m)   Body mass index is 23.88 kg/m. GEN: NAD EYE: Sclerae anicteric ENT: MMM CV: Non-tachycardic GI: Soft, NT/ND NEURO:  Alert & Oriented x 3  Lab Results: No results for input(s): WBC, HGB, HCT, PLT in the last 72 hours. BMET No results for input(s): NA, K, CL, CO2, GLUCOSE, BUN, CREATININE, CALCIUM  in the last 72 hours. LFT No results for input(s): PROT, ALBUMIN, AST, ALT, ALKPHOS, BILITOT, BILIDIR, IBILI in the last 72 hours. PT/INR No results for input(s): LABPROT, INR in the last 72 hours.   Impression / Plan: This is a 78 y.o.male who presents for Colonoscopy for surveillance of previous adenomas.  The risks and benefits of endoscopic evaluation/treatment were discussed with the patient and/or family; these include but are not limited to the risk of perforation, infection, bleeding, missed lesions, lack of diagnosis, severe illness requiring hospitalization, as well as anesthesia and sedation related illnesses.  The patient's history has been reviewed, patient examined, no change in status, and deemed stable for procedure.  The patient and/or family was provided an opportunity to ask questions and all were  answered.  The patient and/or family is agreeable to proceed.    Aloha Finner, MD [facility] Advanced Endoscopy Office # [phone]

## 2024-09-16 NOTE — Progress Notes (Signed)
 Called to room to assist during endoscopic procedure.  Patient ID and intended procedure confirmed with present staff. Received instructions for my participation in the procedure from the performing physician.

## 2024-09-16 NOTE — Patient Instructions (Signed)
 YOU HAD AN ENDOSCOPIC PROCEDURE TODAY AT THE [facility]:   Refer to the procedure report that was given to you for any specific questions about what was found during the examination.  If the procedure report does not answer your questions, please call your gastroenterologist to clarify.  If you requested that your care partner not be given the details of your procedure findings, then the procedure report has been included in a sealed envelope for you to review at your convenience later.  YOU SHOULD EXPECT: Some feelings of bloating in the abdomen. Passage of more gas than usual.  Walking can help get rid of the air that was put into your GI tract during the procedure and reduce the bloating. If you had a lower endoscopy (such as a colonoscopy or flexible sigmoidoscopy) you may notice spotting of blood in your stool or on the toilet paper. If you underwent a bowel prep for your procedure, you may not have a normal bowel movement for a few days.  Please Note:  You might notice some irritation and congestion in your nose or some drainage.  This is from the oxygen used during your procedure.  There is no need for concern and it should clear up in a day or so.  SYMPTOMS TO REPORT IMMEDIATELY:  Following lower endoscopy (colonoscopy or flexible sigmoidoscopy):  Excessive amounts of blood in the stool  Significant tenderness or worsening of abdominal pains  Swelling of the abdomen that is new, acute  Fever of 100F or higher  High fiber diet Use FiberCon  1-2 tablets by mouth daily Continue present medications Await pathology results Repeat colonoscopy in 5-7 years pending pathology and history of previous adenomatous colon polyps Handouts on hemorrhoids and polyps given   For urgent or emergent issues, a gastroenterologist can be reached at any hour by calling [phone]. Do not use MyChart messaging for urgent concerns.    DIET:  We do recommend a small meal at first, but then  you may proceed to your regular diet.  Drink plenty of fluids but you should avoid alcoholic beverages for 24 hours.  ACTIVITY:  You should plan to take it easy for the rest of today and you should NOT DRIVE or use heavy machinery until tomorrow (because of the sedation medicines used during the test).    FOLLOW UP: Our staff will call the number listed on your records the next business day following your procedure.  We will call around 7:15- 8:00 am to check on you and address any questions or concerns that you may have regarding the information given to you following your procedure. If we do not reach you, we will leave a message.     If any biopsies were taken you will be contacted by phone or by letter within the next 1-3 weeks.  Please call us  at [phone] if you have not heard about the biopsies in 3 weeks.    SIGNATURES/CONFIDENTIALITY: You and/or your care partner have signed paperwork which will be entered into your electronic medical record.  These signatures attest to the fact that that the information above on your After Visit Summary has been reviewed and is understood.  Full responsibility of the confidentiality of this discharge information lies with you and/or your care-partner.

## 2024-09-16 NOTE — Progress Notes (Signed)
 Report given to PACU, vss

## 2024-09-17 ENCOUNTER — Telehealth: Payer: Self-pay

## 2024-09-17 ENCOUNTER — Encounter: Payer: Self-pay | Admitting: Internal Medicine

## 2024-09-17 NOTE — Telephone Encounter (Signed)
  Follow up Call-     09/16/2024    7:41 AM 10/02/2022    9:06 AM 04/20/2022    2:05 PM  Call back number  Post procedure Call Back phone  # [phone]- okay to talk to wife Marval  Permission to leave phone message Yes Yes Yes     Patient questions:  Do you have a fever, pain , or abdominal swelling? No. Pain Score  0 *  Have you tolerated food without any problems? Yes.    Have you been able to return to your normal activities? Yes.    Do you have any questions about your discharge instructions: Diet   No. Medications  No. Follow up visit  No.  Do you have questions or concerns about your Care? No.  Actions: * If pain score is 4 or above: No action needed, pain <4.

## 2024-09-18 ENCOUNTER — Encounter: Payer: Self-pay | Admitting: Internal Medicine

## 2024-09-18 ENCOUNTER — Ambulatory Visit: Payer: Self-pay | Admitting: Gastroenterology

## 2024-09-18 LAB — SURGICAL PATHOLOGY

## 2024-09-18 MED ORDER — CITALOPRAM HYDROBROMIDE 10 MG PO TABS: 10.0000 mg | ORAL_TABLET | Freq: Every day | ORAL | 3 refills | Status: DC

## 2024-09-24 ENCOUNTER — Other Ambulatory Visit: Payer: Self-pay | Admitting: Internal Medicine

## 2024-09-26 ENCOUNTER — Emergency Department (HOSPITAL_BASED_OUTPATIENT_CLINIC_OR_DEPARTMENT_OTHER)
Admission: EM | Admit: 2024-09-26 | Discharge: 2024-09-26 | Disposition: A | Discharge status: Home / Self Care | Attending: Emergency Medicine | Admitting: Emergency Medicine

## 2024-09-26 ENCOUNTER — Emergency Department (HOSPITAL_BASED_OUTPATIENT_CLINIC_OR_DEPARTMENT_OTHER)

## 2024-09-26 ENCOUNTER — Other Ambulatory Visit: Payer: Self-pay

## 2024-09-26 DIAGNOSIS — I129 Hypertensive chronic kidney disease with stage 1 through stage 4 chronic kidney disease, or unspecified chronic kidney disease: Secondary | ICD-10-CM | POA: Diagnosis not present

## 2024-09-26 DIAGNOSIS — N183 Chronic kidney disease, stage 3 unspecified: Secondary | ICD-10-CM | POA: Diagnosis not present

## 2024-09-26 DIAGNOSIS — Z7982 Long term (current) use of aspirin: Secondary | ICD-10-CM | POA: Insufficient documentation

## 2024-09-26 DIAGNOSIS — Z87891 Personal history of nicotine dependence: Secondary | ICD-10-CM | POA: Insufficient documentation

## 2024-09-26 DIAGNOSIS — I493 Ventricular premature depolarization: Secondary | ICD-10-CM | POA: Insufficient documentation

## 2024-09-26 DIAGNOSIS — I2510 Atherosclerotic heart disease of native coronary artery without angina pectoris: Secondary | ICD-10-CM | POA: Diagnosis not present

## 2024-09-26 DIAGNOSIS — Z85828 Personal history of other malignant neoplasm of skin: Secondary | ICD-10-CM | POA: Insufficient documentation

## 2024-09-26 DIAGNOSIS — R42 Dizziness and giddiness: Secondary | ICD-10-CM | POA: Diagnosis present

## 2024-09-26 DIAGNOSIS — Z79899 Other long term (current) drug therapy: Secondary | ICD-10-CM | POA: Insufficient documentation

## 2024-09-26 LAB — URINALYSIS, W/ REFLEX TO CULTURE (INFECTION SUSPECTED)
Bacteria, UA: NONE SEEN
Bilirubin Urine: NEGATIVE
Glucose, UA: NEGATIVE mg/dL
Hgb urine dipstick: NEGATIVE
Ketones, ur: NEGATIVE mg/dL
Leukocytes,Ua: NEGATIVE
Nitrite: NEGATIVE
Protein, ur: NEGATIVE mg/dL
Specific Gravity, Urine: 1.009 (ref 1.005–1.030)
pH: 5.5 (ref 5.0–8.0)

## 2024-09-26 LAB — CBC WITH DIFFERENTIAL/PLATELET
Abs Immature Granulocytes: 0.01 K/uL (ref 0.00–0.07)
Basophils Absolute: 0.1 K/uL (ref 0.0–0.1)
Basophils Relative: 1 %
Eosinophils Absolute: 0.2 K/uL (ref 0.0–0.5)
Eosinophils Relative: 2 %
HCT: 41.1 % (ref 39.0–52.0)
Hemoglobin: 14.1 g/dL (ref 13.0–17.0)
Immature Granulocytes: 0 %
Lymphocytes Relative: 20 %
Lymphs Abs: 1.6 K/uL (ref 0.7–4.0)
MCH: 29.9 pg (ref 26.0–34.0)
MCHC: 34.3 g/dL (ref 30.0–36.0)
MCV: 87.3 fL (ref 80.0–100.0)
Monocytes Absolute: 0.6 K/uL (ref 0.1–1.0)
Monocytes Relative: 7 %
Neutro Abs: 5.6 K/uL (ref 1.7–7.7)
Neutrophils Relative %: 70 %
Platelets: 232 K/uL (ref 150–400)
RBC: 4.71 MIL/uL (ref 4.22–5.81)
RDW: 12.2 % (ref 11.5–15.5)
WBC: 8 K/uL (ref 4.0–10.5)
nRBC: 0 % (ref 0.0–0.2)

## 2024-09-26 LAB — BASIC METABOLIC PANEL WITH GFR
Anion gap: 13 (ref 5–15)
BUN: 18 mg/dL (ref 8–23)
CO2: 23 mmol/L (ref 22–32)
Calcium: 10 mg/dL (ref 8.9–10.3)
Chloride: 101 mmol/L (ref 98–111)
Creatinine, Ser: 1.28 mg/dL — ABNORMAL HIGH (ref 0.61–1.24)
GFR, Estimated: 58 mL/min — ABNORMAL LOW (ref >60)
Glucose, Bld: 104 mg/dL — ABNORMAL HIGH (ref 70–99)
Potassium: 4.1 mmol/L (ref 3.5–5.1)
Sodium: 138 mmol/L (ref 135–145)

## 2024-09-26 LAB — TROPONIN T, HIGH SENSITIVITY
Troponin T High Sensitivity: 15 ng/L (ref 0–19)
Troponin T High Sensitivity: <15 ng/L (ref 0–19)

## 2024-09-26 LAB — TSH: TSH: 1.2 u[IU]/mL (ref 0.350–4.500)

## 2024-09-26 MED ORDER — SODIUM CHLORIDE 0.9 % IV BOLUS
1000.0000 mL | Freq: Once | INTRAVENOUS | Status: AC
  Administered 2024-09-26: 1000 mL via INTRAVENOUS

## 2024-09-26 MED ORDER — MAGNESIUM SULFATE 50 % IJ SOLN
2.0000 g | Freq: Once | INTRAMUSCULAR | Status: DC
  Filled 2024-09-26: qty 4

## 2024-09-26 MED ORDER — MAGNESIUM SULFATE 2 GM/50ML IV SOLN
2.0000 g | Freq: Once | INTRAVENOUS | Status: AC
  Administered 2024-09-26: 2 g via INTRAVENOUS
  Filled 2024-09-26: qty 50

## 2024-09-26 NOTE — Discharge Instructions (Addendum)
 While you were in the emergency room, you had blood work done that was normal.  You received some IV fluids and some magnesium  which did seem to reduce the number of PVCs that you are having.  Like we discussed, these usually are not serious problems, but sometimes they can cause some unusual symptoms.  I have sent a message to your cardiologist.  If they do not reach out to you within the next 1 week, I recommend calling their office for follow-up appointment.  Please call your primary care doctor on Monday for a follow-up appointment.  Return to the emergency room if you experience worsening symptoms or chest pain.

## 2024-09-26 NOTE — ED Triage Notes (Signed)
 Patient states dizziness ongoing for several months. States BP was 135/88 at home. States cuff says irregular heartbeat. No history of afib.

## 2024-09-26 NOTE — ED Notes (Signed)
 Reviewed discharge instructions and follow-up care with pt. Pt verbalized understanding and had no further questions. Pt exited ED without complications.

## 2024-09-26 NOTE — ED Provider Notes (Signed)
 [place] [facility] AT [facility] Provider Note  CSN: [id number] Arrival date & time: 09/26/24 1025  Chief Complaint(s) Dizziness  HPI Oscar Dixon is a 78 y.o. male who is here today for dizziness of several months duration.  Patient reports that he has intermittently been feeling lightheaded, worse when standing up.  Patient states that this morning, he was making breakfast, began to feel lightheaded.  He states that he checked his blood pressure, not his cuff that showed an irregular heartbeat.  Patient came to the emergency department for further evaluation.   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   Cataract    bilateral sx   DYSPNEA ON EXERTION 12/15/2009   Erectile dysfunction 04/12/2012   FATIGUE 03/27/2010   GERD 05/20/2007   on meds   GOUT 03/27/2010   HAND PAIN 12/15/2009   Headache(784.0) 08/25/2009   HERNIA, UMBILICAL 12/15/2009   HYPERLIPIDEMIA 07/09/2008   on meds   HYPERTENSION 05/20/2007   on meds   LIBIDO, DECREASED 03/27/2010   Mitral regurgitation 09/12/2011   Other specified forms of hearing loss 06/30/2010   Patient Active Problem List   Diagnosis Date Noted   Impacted cerumen of both ears 01/08/2024   Neoplasm of uncertain behavior of skin 03/13/2023   Anxiety 03/13/2023   CAD (coronary artery disease) 03/13/2023   Encounter for well adult exam with abnormal findings 09/06/2022   Early satiety 04/21/2022   Fatigue 04/19/2022   Unintentional weight loss 04/19/2022   Iron  deficiency anemia 03/19/2022   Dyspnea 03/01/2022   CKD (chronic kidney disease) stage 3, GFR 30-59 ml/min (HCC) 03/01/2022   Actinic keratosis 10/27/2021   Personal history of other malignant neoplasm of skin 10/27/2021   Abdominal pain 06/29/2021   Anemia 06/29/2021   Vitamin D  deficiency 12/21/2020   Aortic atherosclerosis  12/21/2020   Allergic rhinitis 11/20/2020   Pain of left thumb 03/15/2020   Elevated coronary artery calcium  score 02/24/2020   Cough 12/13/2017   Plantar fasciitis of left foot 08/02/2017   Fever 01/31/2017   Allergic conjunctivitis 10/02/2016   Hyperglycemia 10/02/2016   Scapular dyskinesis 06/08/2016   Nonallopathic lesion of thoracic region 06/08/2016   Nonallopathic lesion of rib cage 06/08/2016   Nonallopathic lesion of lumbosacral region 06/08/2016   Arthritis of hand, 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/22/2014   Insomnia 09/22/2014   Bilateral hearing loss 09/22/2014   Eustachian tube dysfunction 10/27/2013   Acute sinus infection 10/07/2013   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 Authorizing Provider  amLODipine  (NORVASC ) 10 MG tablet TAKE 1 TABLET(10 MG) BY MOUTH DAILY 02/26/24   Norleen Lynwood ORN, MD  aspirin 81 MG EC tablet Take 81 mg by mouth daily.    [provider]  benazepril  (LOTENSIN ) 40 MG tablet TAKE 1 TABLET(40 MG) BY MOUTH DAILY  11/11/23   Norleen Lynwood ORN, MD  Cholecalciferol (VITAMIN D3 PO) Take 2,000 Units by mouth daily.    [provider]  citalopram  (CELEXA ) 10 MG tablet Take 1 tablet (10 mg total) by mouth daily. 09/18/24 09/18/25  Norleen Lynwood ORN, MD  clobetasol ointment (TEMOVATE) 0.05 % 1 Application. 03/23/24   [provider]  clonazePAM  (KLONOPIN ) 0.5 MG tablet TAKE 1  TABLET(0.5 MG) BY MOUTH TWICE DAILY AS NEEDED FOR ANXIETY 07/27/24   Norleen Lynwood ORN, MD  esomeprazole  (NEXIUM ) 40 MG capsule Take 1 capsule (40 mg total) by mouth daily at 12 noon. 09/08/24   Norleen Lynwood ORN, MD  hydrochlorothiazide  (MICROZIDE ) 12.5 MG capsule Take 1 capsule (12.5 mg total) by mouth daily. 01/08/24   Norleen Lynwood ORN, MD  rosuvastatin  (CRESTOR ) 20 MG tablet TAKE 1 TABLET(20 MG) BY MOUTH DAILY 06/12/24   Norleen Lynwood ORN, MD  zolpidem  (AMBIEN ) 5 MG tablet TAKE 1 TABLET(5 MG) BY MOUTH AT BEDTIME AS NEEDED FOR SLEEP 09/24/24   Norleen Lynwood ORN, MD                                                                                                                                    Past Surgical History Past Surgical History:  Procedure Laterality Date   ANKLE SURGERY Left 1967   CATARACT EXTRACTION Bilateral    COLONOSCOPY  2016   TA's -suprep (exc)   ELBOW SURGERY Right 1970   POLYPECTOMY  2016   TA's    s/p basal cell cancer     UMBILICAL HERNIA REPAIR     UPPER GASTROINTESTINAL ENDOSCOPY     Family History Family History  Problem Relation Age of Onset   Hypertension Mother    Alcohol abuse Mother        ETOH   Hypertension Father    Stroke Father 34   Lung cancer Brother 31   Colon polyps Neg Hx    Colon cancer Neg Hx    Esophageal cancer Neg Hx    Stomach cancer Neg Hx    Rectal cancer Neg Hx    Inflammatory bowel disease Neg Hx    Liver disease Neg Hx    Pancreatic cancer Neg Hx     Social History Social History   Tobacco Use   Smoking status: Former    Current packs/day: 0.00    Types: Cigarettes    Start date: 10    Quit date: 1988    Years since quitting: 37.9   Smokeless tobacco: Never  Vaping Use   Vaping status: Never Used  Substance Use Topics   Alcohol use: Yes    Alcohol/week: 7.0 standard drinks of alcohol    Types: 7 Standard drinks or equivalent per week   Drug use: Never   Allergies Amoxicillin  and Augmentin  [amoxicillin -pot clavulanate]  Review  of Systems Review of Systems  Physical Exam Vital Signs  I have reviewed  the triage vital signs BP 138/82   Pulse 66   Temp 97.9 F (36.6 C) (Oral)   Resp 18   SpO2 93%   Physical Exam Vitals and nursing note reviewed.  Constitutional:      Appearance: He is not ill-appearing or toxic-appearing.  HENT:     Head: Normocephalic.  Eyes:     Pupils: Pupils are equal, round, and reactive to light.  Cardiovascular:     Rate and Rhythm: Normal rate and regular rhythm.  Abdominal:     General: Abdomen is flat.  Musculoskeletal:        General: Normal range of motion.  Neurological:     General: No focal deficit present.     Mental Status: He is alert.     Gait: Gait normal.     ED Results and Treatments Labs (all labs ordered are listed, but only abnormal results are displayed) Labs Reviewed  BASIC METABOLIC PANEL WITH GFR - Abnormal; Notable for the following components:      Result Value   Glucose, Bld 104 (*)    Creatinine, Ser 1.28 (*)    GFR, Estimated 58 (*)    All other components within normal limits  URINALYSIS, W/ REFLEX TO CULTURE (INFECTION SUSPECTED) - Abnormal; Notable for the following components:   Color, Urine COLORLESS (*)    All other components within normal limits  CBC WITH DIFFERENTIAL/PLATELET  TSH  TROPONIN T, HIGH SENSITIVITY  TROPONIN T, HIGH SENSITIVITY                                                                                                                          Radiology DG Chest 1 View Result Date: 09/26/2024 CLINICAL DATA:  cough EXAM: CHEST  1 VIEW COMPARISON:  February 26, 2022 FINDINGS: The cardiomediastinal silhouette is unchanged in contour.Large hiatal hernia. Biapical scarring. No pleural effusion. No pneumothorax. No acute pleuroparenchymal abnormality. IMPRESSION: 1. No acute cardiopulmonary abnormality. 2. Large hiatal hernia. Electronically Signed   By: Corean Salter M.D.   On: 09/26/2024 11:55    Pertinent labs &  imaging results that were available during my care of the patient were reviewed by me and considered in my medical decision making (see MDM for details).  Medications Ordered in ED Medications  sodium chloride  0.9 % bolus 1,000 mL (0 mLs Intravenous Stopped 09/26/24 1233)  magnesium  sulfate IVPB 2 g 50 mL (0 g Intravenous Stopped 09/26/24 1146)  Procedures Procedures  (including critical care time)  Medical Decision Making / ED Course   This patient presents to the ED for concern of lightheadedness, this involves an extensive number of treatment options, and is a complaint that carries with it a high risk of complications and morbidity.  The differential diagnosis includes arrhythmia, frequent PVCs, dehydration, anemia, less likely CVA.  MDM: Patient reports dizziness, however gives an explicit description of lightheadedness, denies any vertiginous symptoms.  Patient on her monitor having relatively frequent PVCs.  On his EKG, he did have episode of.  PVCs, however during my period of time monitoring the patient's cardiac monitor here in our doc box, did not observe any additional paired PVCs.  Will obtain blood work, provide some magnesium .  Will check troponin on the patient.  Patient otherwise very healthy, active.  He is here today with his wife at bedside.  Reassessment 2:45 PM-have a monitor and the patient here in the emergency room.  Patient does have fairly regular PVCs, however not seen any coupling.  Patient states that he does feel slightly better.  Did a second troponin on the patient which was not elevated.  I discussed the patient's symptoms with him and did offer admission given that his symptoms could be construed as near syncope, versus close outpatient follow-up with his cardiologist.  Patient strongly preferred outpatient follow-up.  I  discussed return precautions with the patient and patient's wife at bedside.  Patient was able to leave in the emergency room without any difficulty, no significant tachycardia noted.  He is appropriate for discharge.   Additional history obtained: -Additional history obtained from wife at bedside -External records from outside source obtained and reviewed including: Chart review including previous notes, labs, imaging, consultation notes   Lab Tests: -I ordered, reviewed, and interpreted labs.   The pertinent results include:   Labs Reviewed  BASIC METABOLIC PANEL WITH GFR - Abnormal; Notable for the following components:      Result Value   Glucose, Bld 104 (*)    Creatinine, Ser 1.28 (*)    GFR, Estimated 58 (*)    All other components within normal limits  URINALYSIS, W/ REFLEX TO CULTURE (INFECTION SUSPECTED) - Abnormal; Notable for the following components:   Color, Urine COLORLESS (*)    All other components within normal limits  CBC WITH DIFFERENTIAL/PLATELET  TSH  TROPONIN T, HIGH SENSITIVITY  TROPONIN T, HIGH SENSITIVITY      EKG sinus rhythm, intermittent PVCs.  EKG Interpretation Date/Time:  Saturday September 26 2024 10:48:04 EST Ventricular Rate:  105 PR Interval:  164 QRS Duration:  95 QT Interval:  357 QTC Calculation: 420 R Axis:   222  Text Interpretation: Sinus tachycardia Paired ventricular premature complexes S1,S2,S3 pattern Confirmed by Mannie Pac [phone]) on 09/26/2024 2:50:42 PM         Imaging Studies ordered: I ordered imaging studies including chest x-ray I independently visualized and interpreted imaging. I agree with the radiologist interpretation   Medicines ordered and prescription drug management: Meds ordered this encounter  Medications   DISCONTD: magnesium  sulfate (IV Push/IM) injection 2 g   sodium chloride  0.9 % bolus 1,000 mL   magnesium  sulfate IVPB 2 g 50 mL    -I have reviewed the patients home medicines and have  made adjustments as needed   Cardiac Monitoring: The patient was maintained on a cardiac monitor.  I personally viewed and interpreted the cardiac monitored which showed an underlying rhythm of: Normal sinus rhythm  Reevaluation: After the interventions noted above, I reevaluated the patient and found that they have :improved  Co morbidities that complicate the patient evaluation  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   Cataract    bilateral sx   DYSPNEA ON EXERTION 12/15/2009   Erectile dysfunction 04/12/2012   FATIGUE 03/27/2010   GERD 05/20/2007   on meds   GOUT 03/27/2010   HAND PAIN 12/15/2009   Headache(784.0) 08/25/2009   HERNIA, UMBILICAL 12/15/2009   HYPERLIPIDEMIA 07/09/2008   on meds   HYPERTENSION 05/20/2007   on meds   LIBIDO, DECREASED 03/27/2010   Mitral regurgitation 09/12/2011   Other specified forms of hearing loss 06/30/2010      Dispostion: I considered admission for this patient, however through shared decision making with the patient's reassuring workup, he prefers to follow-up outpatient.  Secure chat sent to the patient's cardiologist.     Final Clinical Impression(s) / ED Diagnoses Final diagnoses:  PVC (premature ventricular contraction)     @PCDICTATION @    Mannie Pac T, DO 09/26/24 1454

## 2024-09-26 NOTE — ED Notes (Signed)
 Pt ambulated around the unit without difficulty. HR between 88-98 while walking. Denies CP, SOB or dizziness. EDP made aware.

## 2024-09-26 NOTE — ED Notes (Signed)
 ED Provider at bedside.

## 2024-09-28 ENCOUNTER — Other Ambulatory Visit: Payer: Self-pay | Admitting: Internal Medicine

## 2024-09-29 ENCOUNTER — Telehealth: Payer: Self-pay

## 2024-09-29 NOTE — Telephone Encounter (Signed)
 Dr. Court received a message from ED provider at [facility], Dr. Mannie requesting follow up office visit for pt. Left message for pt to call back to get scheduled.

## 2024-10-20 ENCOUNTER — Ambulatory Visit: Payer: Self-pay | Admitting: Internal Medicine

## 2024-10-20 ENCOUNTER — Encounter: Payer: Self-pay | Admitting: Internal Medicine

## 2024-10-20 ENCOUNTER — Ambulatory Visit: Admitting: Internal Medicine

## 2024-10-20 ENCOUNTER — Telehealth: Payer: Self-pay

## 2024-10-20 VITALS — BP 124/78 | HR 63 | Temp 98.2°F | Ht 74.0 in | Wt 186.0 lb

## 2024-10-20 DIAGNOSIS — E559 Vitamin D deficiency, unspecified: Secondary | ICD-10-CM

## 2024-10-20 DIAGNOSIS — D500 Iron deficiency anemia secondary to blood loss (chronic): Secondary | ICD-10-CM | POA: Diagnosis not present

## 2024-10-20 DIAGNOSIS — N1831 Chronic kidney disease, stage 3a: Secondary | ICD-10-CM

## 2024-10-20 DIAGNOSIS — E782 Mixed hyperlipidemia: Secondary | ICD-10-CM

## 2024-10-20 DIAGNOSIS — R739 Hyperglycemia, unspecified: Secondary | ICD-10-CM

## 2024-10-20 DIAGNOSIS — I10 Essential (primary) hypertension: Secondary | ICD-10-CM | POA: Diagnosis not present

## 2024-10-20 DIAGNOSIS — R972 Elevated prostate specific antigen [PSA]: Secondary | ICD-10-CM | POA: Diagnosis not present

## 2024-10-20 LAB — HEPATIC FUNCTION PANEL
ALT: 15 U/L (ref 0–53)
AST: 21 U/L (ref 5–37)
Albumin: 4.4 g/dL (ref 3.5–5.2)
Alkaline Phosphatase: 32 U/L — ABNORMAL LOW (ref 39–117)
Bilirubin, Direct: 0.2 mg/dL (ref 0.0–0.3)
Total Bilirubin: 0.7 mg/dL (ref 0.2–1.2)
Total Protein: 7.1 g/dL (ref 6.0–8.3)

## 2024-10-20 LAB — BASIC METABOLIC PANEL WITH GFR
BUN: 20 mg/dL (ref 6–23)
CO2: 28 meq/L (ref 19–32)
Calcium: 9.4 mg/dL (ref 8.4–10.5)
Chloride: 101 meq/L (ref 96–112)
Creatinine, Ser: 1.3 mg/dL (ref 0.40–1.50)
GFR: 53.53 mL/min — ABNORMAL LOW (ref >60.00)
Glucose, Bld: 100 mg/dL — ABNORMAL HIGH (ref 70–99)
Potassium: 4.2 meq/L (ref 3.5–5.1)
Sodium: 140 meq/L (ref 135–145)

## 2024-10-20 LAB — LIPID PANEL
Cholesterol: 134 mg/dL (ref 28–200)
HDL: 75.9 mg/dL (ref >39.00)
LDL Cholesterol: 48 mg/dL (ref 0–99)
NonHDL: 57.85
Total CHOL/HDL Ratio: 2
Triglycerides: 51 mg/dL (ref 0.0–149.0)
VLDL: 10.2 mg/dL (ref 0.0–40.0)

## 2024-10-20 LAB — HEMOGLOBIN A1C: Hgb A1c MFr Bld: 5.9 % (ref 4.6–6.5)

## 2024-10-20 LAB — PSA: PSA: 3.69 ng/mL (ref 0.10–4.00)

## 2024-10-20 LAB — VITAMIN D 25 HYDROXY (VIT D DEFICIENCY, FRACTURES): VITD: 75.39 ng/mL (ref 30.00–100.00)

## 2024-10-20 MED ORDER — ROSUVASTATIN CALCIUM 20 MG PO TABS: ORAL_TABLET | ORAL | 3 refills | Status: AC

## 2024-10-20 MED ORDER — BENAZEPRIL HCL 40 MG PO TABS: 40.0000 mg | ORAL_TABLET | Freq: Every day | ORAL | 3 refills | Status: AC

## 2024-10-20 MED ORDER — ZOLPIDEM TARTRATE 5 MG PO TABS: ORAL_TABLET | ORAL | 1 refills | Status: AC

## 2024-10-20 MED ORDER — CLONAZEPAM 0.5 MG PO TABS: 0.5000 mg | ORAL_TABLET | Freq: Two times a day (BID) | ORAL | 2 refills | Status: AC | PRN

## 2024-10-20 MED ORDER — ESOMEPRAZOLE MAGNESIUM 40 MG PO CPDR: 40.0000 mg | DELAYED_RELEASE_CAPSULE | Freq: Every day | ORAL | 3 refills | Status: AC

## 2024-10-20 MED ORDER — HYDROCHLOROTHIAZIDE 12.5 MG PO CAPS: 12.5000 mg | ORAL_CAPSULE | Freq: Every day | ORAL | 3 refills | Status: AC

## 2024-10-20 MED ORDER — METOPROLOL SUCCINATE ER 50 MG PO TB24: 50.0000 mg | ORAL_TABLET | Freq: Every day | ORAL | 3 refills | Status: AC

## 2024-10-20 NOTE — Assessment & Plan Note (Signed)
 Last vitamin D  Lab Results  Component Value Date   VD25OH 54.12 01/16/2024   Stable, cont oral replacement

## 2024-10-20 NOTE — Assessment & Plan Note (Signed)
 Lab Results  Component Value Date   HGBA1C 6.1 08/24/2024   Stable, pt to continue current medical treatment  - diet, wt control

## 2024-10-20 NOTE — Progress Notes (Signed)
 Patient ID: IVAL PACER, male   DOB: 03/21/1946, 78 y.o.   MRN: [id number]        Chief Complaint: follow up ckd3a, hld, htn, iron  deficiency, PVCs       HPI:  Oscar Dixon is a 78 y.o. male here after seen at ED with Ecg with PVC, pt still with occasional palpitations and wishes to tx.  Pt denies chest pain, increased sob or doe, wheezing, orthopnea, PND, increased LE swelling, dizziness or syncope.  Pt denies polydipsia, polyuria, or new focal neuro s/s.    Pt denies fever, wt loss, night sweats, loss of appetite, or other constitutional symptoms         Wt Readings from Last 3 Encounters:  10/20/24 186 lb (84.4 kg)  09/16/24 186 lb (84.4 kg)  08/31/24 186 lb (84.4 kg)   BP Readings from Last 3 Encounters:  10/20/24 124/78  09/26/24 (!) 140/87  09/16/24 (!) 162/99         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   Cataract    bilateral sx   DYSPNEA ON EXERTION 12/15/2009   Erectile dysfunction 04/12/2012   FATIGUE 03/27/2010   GERD 05/20/2007   on meds   GOUT 03/27/2010   HAND PAIN 12/15/2009   Headache(784.0) 08/25/2009   HERNIA, UMBILICAL 12/15/2009   HYPERLIPIDEMIA 07/09/2008   on meds   HYPERTENSION 05/20/2007   on meds   LIBIDO, DECREASED 03/27/2010   Mitral regurgitation 09/12/2011   Other specified forms of hearing loss 06/30/2010   Past Surgical History:  Procedure Laterality Date   ANKLE SURGERY Left 1967   CATARACT EXTRACTION Bilateral    COLONOSCOPY  2016   TA's -suprep (exc)   ELBOW SURGERY Right 1970   POLYPECTOMY  2016   TA's    s/p basal cell cancer     UMBILICAL HERNIA REPAIR     UPPER GASTROINTESTINAL ENDOSCOPY      reports that he quit smoking about 37 years ago. His smoking use included cigarettes. He started smoking about 47 years ago. He has never used smokeless tobacco. He reports current alcohol use of about 7.0 standard drinks  of alcohol per week. He reports that he does not use drugs. family history includes Alcohol abuse in his mother; Hypertension in his father and mother; Lung cancer (age of onset: 42) in his brother; Stroke (age of onset: 55) in his father. Allergies[1] Medications Ordered Prior to Encounter[2]      ROS:  All others reviewed and negative.  Objective        PE:  BP 124/78 (BP Location: Right Arm, Patient Position: Sitting, Cuff Size: Normal)   Pulse 63   Temp 98.2 F (36.8 C) (Oral)   Ht 6' 2 (1.88 m)   Wt 186 lb (84.4 kg)   SpO2 98%   BMI 23.88 kg/m                 Constitutional: Pt appears in NAD               HENT: Head: NCAT.                Right Ear: External ear normal.                 Left Ear: External ear normal.  Eyes: . Pupils are equal, round, and reactive to light. Conjunctivae and EOM are normal               Nose: without d/c or deformity               Neck: Neck supple. Gross normal ROM               Cardiovascular: Normal rate and regular rhythm.                 Pulmonary/Chest: Effort normal and breath sounds without rales or wheezing.                Abd:  Soft, NT, ND, + BS, no organomegaly               Neurological: Pt is alert. At baseline orientation, motor grossly intact               Skin: Skin is warm. No rashes, no other new lesions, LE edema - none               Psychiatric: Pt behavior is normal without agitation   Micro: none  Cardiac tracings I have personally interpreted today:  none  Pertinent Radiological findings (summarize): none   Lab Results  Component Value Date   WBC 8.0 09/26/2024   HGB 14.1 09/26/2024   HCT 41.1 09/26/2024   PLT 232 09/26/2024   GLUCOSE 100 (H) 10/20/2024   CHOL 134 10/20/2024   TRIG 51.0 10/20/2024   HDL 75.90 10/20/2024   LDLCALC 48 10/20/2024   ALT 15 10/20/2024   AST 21 10/20/2024   NA 140 10/20/2024   K 4.2 10/20/2024   CL 101 10/20/2024   CREATININE 1.30 10/20/2024   BUN 20 10/20/2024    CO2 28 10/20/2024   TSH 1.200 09/26/2024   PSA 3.69 10/20/2024   HGBA1C 5.9 10/20/2024   MICROALBUR 1.7 01/16/2024   Assessment/Plan:  Oscar Dixon is a 78 y.o. White or Caucasian [1] male with  has a past medical history of ADD (07/09/2008), Allergy, Arthritis, CARPAL TUNNEL SYNDROME, BILATERAL (12/15/2009), Cataract, DYSPNEA ON EXERTION (12/15/2009), Erectile dysfunction (04/12/2012), FATIGUE (03/27/2010), GERD (05/20/2007), GOUT (03/27/2010), HAND PAIN (12/15/2009), Headache(784.0) (08/25/2009), HERNIA, UMBILICAL (12/15/2009), HYPERLIPIDEMIA (07/09/2008), HYPERTENSION (05/20/2007), LIBIDO, DECREASED (03/27/2010), Mitral regurgitation (09/12/2011), and Other specified forms of hearing loss (06/30/2010).  Vitamin D  deficiency Last vitamin D  Lab Results  Component Value Date   VD25OH 54.12 01/16/2024   Stable, cont oral replacement   Iron  deficiency anemia No overt bleeding, for f/u lab iron  ferritin  Hyperlipidemia Lab Results  Component Value Date   LDLCALC 122 (H) 08/24/2024   Uncontrolled, now taking crestor  20 mg daily, pt to continue current statin and f/u lab today   Hyperglycemia Lab Results  Component Value Date   HGBA1C 6.1 08/24/2024   Stable, pt to continue current medical treatment  - diet, wt control   Essential hypertension BP Readings from Last 3 Encounters:  10/20/24 124/78  09/26/24 (!) 140/87  09/16/24 (!) 162/99   Stable, pt to continue medical treatment lotensin  40 every day, hct 12.5 every day, but change amlodipine  10 mg to toprol  xl 50 mg given PVCs concern; pt to f/u BP and HR at home closely   CKD (chronic kidney disease) stage 3, GFR 30-59 ml/min (HCC) Lab Results  Component Value Date   CREATININE 1.28 (H) 09/26/2024   Stable overall, cont to avoid nephrotoxins  ckd3a  Elevated PSA Lab Results  Component Value Date   PSA 3.69 10/20/2024   PSA 4.54 (H) 01/16/2024   PSA 2.68 03/13/2023   Stable and improved, cont to  follow  Followup: Return in about 1 year (around 10/20/2025).  Lynwood Rush, MD 10/20/2024 12:05 PM [facility]     [1]  Allergies Allergen Reactions   Amoxicillin  Diarrhea and Other (See Comments)   Augmentin  [Amoxicillin -Pot Clavulanate] Diarrhea    diarrhea  [2]  Current Outpatient Medications on File Prior to Visit  Medication Sig Dispense Refill   aspirin 81 MG EC tablet Take 81 mg by mouth daily.     Cholecalciferol (VITAMIN D3 PO) Take 2,000 Units by mouth daily.     clobetasol ointment (TEMOVATE) 0.05 % 1 Application.     No current facility-administered medications on file prior to visit.

## 2024-10-20 NOTE — Assessment & Plan Note (Signed)
 Lab Results  Component Value Date   LDLCALC 122 (H) 08/24/2024   Uncontrolled, now taking crestor  20 mg daily, pt to continue current statin and f/u lab today

## 2024-10-20 NOTE — Patient Instructions (Signed)
 Please take all new medication as prescribed  - the metoprolol  xl 50 mg per day  Ok to just stop the amlodipine   Please watch for increased BP at home on the new medication, as well as any low heart rates (especially below 50)  Please continue all other medications as before, and refills have been done if requested.  Please have the pharmacy call with any other refills you may need.  Please continue your efforts at being more active, low cholesterol diet, and weight control.  You are otherwise up to date with prevention measures today.  Please keep your appointments with your specialists as you may have planned  Please go to the LAB at the blood drawing area for the tests to be done  You will be contacted by phone if any changes need to be made immediately.  Otherwise, you will receive a letter about your results with an explanation, but please check with MyChart first.  Please make an Appointment to return for your 1 year visit, or sooner if needed

## 2024-10-20 NOTE — Telephone Encounter (Signed)
 Copied from CRM #[id number]. Topic: Appointments - Scheduling Inquiry for Clinic >> Oct 20, 2024 10:37 AM Vena HERO wrote: Reason for CRM: Pt seen Dr Norleen for appt this morning and they discussed Dr Naomi retirement and who would be a good new provider. Pt would like to request consideration from any male MD who would be willing to take him on as a new patient( per dr norleen). Please call pt to discuss further or with a decision on which provider schedule would be best to continue care.

## 2024-10-20 NOTE — Assessment & Plan Note (Signed)
 Lab Results  Component Value Date   CREATININE 1.28 (H) 09/26/2024   Stable overall, cont to avoid nephrotoxins  ckd3a

## 2024-10-20 NOTE — Assessment & Plan Note (Signed)
 Lab Results  Component Value Date   PSA 3.69 10/20/2024   PSA 4.54 (H) 01/16/2024   PSA 2.68 03/13/2023   Stable and improved, cont to follow

## 2024-10-20 NOTE — Assessment & Plan Note (Signed)
 No overt bleeding, for f/u lab iron  ferritin

## 2024-10-20 NOTE — Assessment & Plan Note (Addendum)
 BP Readings from Last 3 Encounters:  10/20/24 124/78  09/26/24 (!) 140/87  09/16/24 (!) 162/99   Stable, pt to continue medical treatment lotensin  40 every day, hct 12.5 every day, but change amlodipine  10 mg to toprol  xl 50 mg given PVCs concern; pt to f/u BP and HR at home closely

## 2024-10-21 ENCOUNTER — Telehealth: Payer: Self-pay | Admitting: *Deleted

## 2024-10-21 ENCOUNTER — Other Ambulatory Visit: Payer: Self-pay | Admitting: Internal Medicine

## 2024-10-21 LAB — IBC PANEL
Iron: 100 ug/dL (ref 42–165)
Saturation Ratios: 27.7 % (ref 20.0–50.0)
TIBC: 361.2 ug/dL (ref 250.0–450.0)
Transferrin: 258 mg/dL (ref 212.0–360.0)

## 2024-10-21 LAB — FERRITIN: Ferritin: 16.8 ng/mL — ABNORMAL LOW (ref 22.0–322.0)

## 2024-10-21 MED ORDER — POLYSACCHARIDE IRON COMPLEX 150 MG PO CAPS: 150.0000 mg | ORAL_CAPSULE | Freq: Every day | ORAL | 0 refills | Status: DC

## 2024-10-21 NOTE — Telephone Encounter (Signed)
 TJ are you accepting new patients?

## 2024-10-21 NOTE — Telephone Encounter (Signed)
No, I am full  

## 2024-10-21 NOTE — Telephone Encounter (Signed)
 Reason for CRM: Patient is calling to transfer Care to [facility] he was referred to him from Dr Norleen but I have advise Patient that Dr Worth is not accepting new patients, patient also stated that online he shows he is accepting new patients on Family Medicine and please call patients to see if he can be seen by him   Please advise

## 2024-10-22 NOTE — Telephone Encounter (Signed)
 Patient has been made aware and gave a verbal understanding.

## 2024-10-22 NOTE — Telephone Encounter (Signed)
Unfortunately I am not able to accept new patients at this time.

## 2024-10-23 NOTE — Telephone Encounter (Signed)
 LVM advising [facility] is not accepting any new patients at this time.

## 2024-10-23 NOTE — Telephone Encounter (Signed)
Unfortunately I am not able to accept new patients at this time.

## 2024-11-09 ENCOUNTER — Encounter: Payer: Self-pay | Admitting: Internal Medicine

## 2024-11-09 MED ORDER — POLYSACCHARIDE IRON COMPLEX 150 MG PO CAPS: 150.0000 mg | ORAL_CAPSULE | Freq: Every day | ORAL | 1 refills | Status: AC
